# Patient Record
Sex: Male | Born: 1960 | Race: White | Hispanic: No | Marital: Single | Smoking: Never smoker
Health system: Southern US, Community
[De-identification: ages and names within clinical notes are randomized; demographics above are authoritative.]

---

## 2021-09-20 ENCOUNTER — Inpatient Hospital Stay (HOSPITAL_COMMUNITY): Payer: No Typology Code available for payment source

## 2021-09-20 ENCOUNTER — Inpatient Hospital Stay: Payer: Self-pay

## 2021-09-20 ENCOUNTER — Emergency Department (HOSPITAL_COMMUNITY): Payer: No Typology Code available for payment source

## 2021-09-20 ENCOUNTER — Inpatient Hospital Stay (HOSPITAL_COMMUNITY)
Admission: EM | Admit: 2021-09-20 | Discharge: 2021-10-16 | DRG: 004 | Disposition: A | Discharge status: Other Acute Inpatient Hospital | Payer: No Typology Code available for payment source | Attending: Internal Medicine | Admitting: Internal Medicine

## 2021-09-20 DIAGNOSIS — J9600 Acute respiratory failure, unspecified whether with hypoxia or hypercapnia: Secondary | ICD-10-CM

## 2021-09-20 DIAGNOSIS — T17908A Unspecified foreign body in respiratory tract, part unspecified causing other injury, initial encounter: Secondary | ICD-10-CM

## 2021-09-20 DIAGNOSIS — E870 Hyperosmolality and hypernatremia: Secondary | ICD-10-CM | POA: Diagnosis present

## 2021-09-20 DIAGNOSIS — I618 Other nontraumatic intracerebral hemorrhage: Principal | ICD-10-CM | POA: Diagnosis present

## 2021-09-20 DIAGNOSIS — E1165 Type 2 diabetes mellitus with hyperglycemia: Secondary | ICD-10-CM | POA: Diagnosis present

## 2021-09-20 DIAGNOSIS — G936 Cerebral edema: Secondary | ICD-10-CM | POA: Diagnosis present

## 2021-09-20 DIAGNOSIS — J690 Pneumonitis due to inhalation of food and vomit: Secondary | ICD-10-CM | POA: Diagnosis present

## 2021-09-20 DIAGNOSIS — E1169 Type 2 diabetes mellitus with other specified complication: Secondary | ICD-10-CM | POA: Diagnosis present

## 2021-09-20 DIAGNOSIS — R0989 Other specified symptoms and signs involving the circulatory and respiratory systems: Secondary | ICD-10-CM

## 2021-09-20 DIAGNOSIS — R1312 Dysphagia, oropharyngeal phase: Secondary | ICD-10-CM | POA: Diagnosis present

## 2021-09-20 DIAGNOSIS — Z91013 Allergy to seafood: Secondary | ICD-10-CM

## 2021-09-20 DIAGNOSIS — J398 Other specified diseases of upper respiratory tract: Secondary | ICD-10-CM | POA: Diagnosis present

## 2021-09-20 DIAGNOSIS — Z6835 Body mass index (BMI) 35.0-35.9, adult: Secondary | ICD-10-CM

## 2021-09-20 DIAGNOSIS — I6389 Other cerebral infarction: Secondary | ICD-10-CM | POA: Diagnosis not present

## 2021-09-20 DIAGNOSIS — I610 Nontraumatic intracerebral hemorrhage in hemisphere, subcortical: Secondary | ICD-10-CM | POA: Diagnosis not present

## 2021-09-20 DIAGNOSIS — J9811 Atelectasis: Secondary | ICD-10-CM | POA: Diagnosis present

## 2021-09-20 DIAGNOSIS — I619 Nontraumatic intracerebral hemorrhage, unspecified: Secondary | ICD-10-CM | POA: Diagnosis not present

## 2021-09-20 DIAGNOSIS — Z930 Tracheostomy status: Secondary | ICD-10-CM | POA: Diagnosis not present

## 2021-09-20 DIAGNOSIS — Z7901 Long term (current) use of anticoagulants: Secondary | ICD-10-CM

## 2021-09-20 DIAGNOSIS — R338 Other retention of urine: Secondary | ICD-10-CM

## 2021-09-20 DIAGNOSIS — Z91041 Radiographic dye allergy status: Secondary | ICD-10-CM

## 2021-09-20 DIAGNOSIS — H409 Unspecified glaucoma: Secondary | ICD-10-CM | POA: Diagnosis present

## 2021-09-20 DIAGNOSIS — R414 Neurologic neglect syndrome: Secondary | ICD-10-CM | POA: Diagnosis present

## 2021-09-20 DIAGNOSIS — K9420 Gastrostomy complication, unspecified: Secondary | ICD-10-CM | POA: Diagnosis not present

## 2021-09-20 DIAGNOSIS — Z20822 Contact with and (suspected) exposure to covid-19: Secondary | ICD-10-CM | POA: Diagnosis present

## 2021-09-20 DIAGNOSIS — T148XXA Other injury of unspecified body region, initial encounter: Secondary | ICD-10-CM

## 2021-09-20 DIAGNOSIS — L89156 Pressure-induced deep tissue damage of sacral region: Secondary | ICD-10-CM | POA: Diagnosis not present

## 2021-09-20 DIAGNOSIS — Z9911 Dependence on respirator [ventilator] status: Secondary | ICD-10-CM

## 2021-09-20 DIAGNOSIS — I161 Hypertensive emergency: Secondary | ICD-10-CM | POA: Diagnosis present

## 2021-09-20 DIAGNOSIS — D72829 Elevated white blood cell count, unspecified: Secondary | ICD-10-CM | POA: Diagnosis not present

## 2021-09-20 DIAGNOSIS — E669 Obesity, unspecified: Secondary | ICD-10-CM | POA: Diagnosis present

## 2021-09-20 DIAGNOSIS — R471 Dysarthria and anarthria: Secondary | ICD-10-CM | POA: Diagnosis present

## 2021-09-20 DIAGNOSIS — Z888 Allergy status to other drugs, medicaments and biological substances status: Secondary | ICD-10-CM

## 2021-09-20 DIAGNOSIS — I4821 Permanent atrial fibrillation: Secondary | ICD-10-CM | POA: Diagnosis present

## 2021-09-20 DIAGNOSIS — R159 Full incontinence of feces: Secondary | ICD-10-CM | POA: Diagnosis not present

## 2021-09-20 DIAGNOSIS — F05 Delirium due to known physiological condition: Secondary | ICD-10-CM | POA: Diagnosis not present

## 2021-09-20 DIAGNOSIS — G8194 Hemiplegia, unspecified affecting left nondominant side: Secondary | ICD-10-CM | POA: Diagnosis present

## 2021-09-20 DIAGNOSIS — Z978 Presence of other specified devices: Secondary | ICD-10-CM | POA: Diagnosis not present

## 2021-09-20 DIAGNOSIS — I63412 Cerebral infarction due to embolism of left middle cerebral artery: Secondary | ICD-10-CM | POA: Diagnosis not present

## 2021-09-20 DIAGNOSIS — E854 Organ-limited amyloidosis: Secondary | ICD-10-CM | POA: Diagnosis present

## 2021-09-20 DIAGNOSIS — I69191 Dysphagia following nontraumatic intracerebral hemorrhage: Secondary | ICD-10-CM

## 2021-09-20 DIAGNOSIS — Z931 Gastrostomy status: Secondary | ICD-10-CM | POA: Diagnosis not present

## 2021-09-20 DIAGNOSIS — D649 Anemia, unspecified: Secondary | ICD-10-CM | POA: Diagnosis not present

## 2021-09-20 DIAGNOSIS — I152 Hypertension secondary to endocrine disorders: Secondary | ICD-10-CM | POA: Diagnosis present

## 2021-09-20 DIAGNOSIS — G935 Compression of brain: Secondary | ICD-10-CM | POA: Diagnosis present

## 2021-09-20 DIAGNOSIS — J9602 Acute respiratory failure with hypercapnia: Secondary | ICD-10-CM | POA: Diagnosis not present

## 2021-09-20 DIAGNOSIS — N179 Acute kidney failure, unspecified: Secondary | ICD-10-CM | POA: Diagnosis present

## 2021-09-20 DIAGNOSIS — R509 Fever, unspecified: Secondary | ICD-10-CM | POA: Diagnosis not present

## 2021-09-20 DIAGNOSIS — J9601 Acute respiratory failure with hypoxia: Secondary | ICD-10-CM | POA: Diagnosis present

## 2021-09-20 DIAGNOSIS — R61 Generalized hyperhidrosis: Secondary | ICD-10-CM | POA: Diagnosis not present

## 2021-09-20 DIAGNOSIS — I639 Cerebral infarction, unspecified: Secondary | ICD-10-CM

## 2021-09-20 DIAGNOSIS — N390 Urinary tract infection, site not specified: Secondary | ICD-10-CM | POA: Diagnosis not present

## 2021-09-20 DIAGNOSIS — R451 Restlessness and agitation: Secondary | ICD-10-CM | POA: Diagnosis not present

## 2021-09-20 DIAGNOSIS — I480 Paroxysmal atrial fibrillation: Secondary | ICD-10-CM | POA: Diagnosis not present

## 2021-09-20 DIAGNOSIS — J9691 Respiratory failure, unspecified with hypoxia: Secondary | ICD-10-CM | POA: Diagnosis present

## 2021-09-20 DIAGNOSIS — G9349 Other encephalopathy: Secondary | ICD-10-CM | POA: Diagnosis present

## 2021-09-20 DIAGNOSIS — F1090 Alcohol use, unspecified, uncomplicated: Secondary | ICD-10-CM | POA: Diagnosis present

## 2021-09-20 DIAGNOSIS — Z794 Long term (current) use of insulin: Secondary | ICD-10-CM | POA: Diagnosis not present

## 2021-09-20 DIAGNOSIS — T45515A Adverse effect of anticoagulants, initial encounter: Secondary | ICD-10-CM | POA: Diagnosis present

## 2021-09-20 DIAGNOSIS — Z7984 Long term (current) use of oral hypoglycemic drugs: Secondary | ICD-10-CM

## 2021-09-20 DIAGNOSIS — R5381 Other malaise: Secondary | ICD-10-CM | POA: Diagnosis not present

## 2021-09-20 DIAGNOSIS — Z781 Physical restraint status: Secondary | ICD-10-CM

## 2021-09-20 DIAGNOSIS — G4733 Obstructive sleep apnea (adult) (pediatric): Secondary | ICD-10-CM | POA: Diagnosis present

## 2021-09-20 DIAGNOSIS — F419 Anxiety disorder, unspecified: Secondary | ICD-10-CM | POA: Diagnosis not present

## 2021-09-20 DIAGNOSIS — R29810 Facial weakness: Secondary | ICD-10-CM | POA: Diagnosis present

## 2021-09-20 DIAGNOSIS — I10 Essential (primary) hypertension: Secondary | ICD-10-CM | POA: Diagnosis not present

## 2021-09-20 DIAGNOSIS — Z833 Family history of diabetes mellitus: Secondary | ICD-10-CM

## 2021-09-20 DIAGNOSIS — E1159 Type 2 diabetes mellitus with other circulatory complications: Secondary | ICD-10-CM

## 2021-09-20 DIAGNOSIS — Z823 Family history of stroke: Secondary | ICD-10-CM

## 2021-09-20 DIAGNOSIS — Z79899 Other long term (current) drug therapy: Secondary | ICD-10-CM

## 2021-09-20 DIAGNOSIS — I615 Nontraumatic intracerebral hemorrhage, intraventricular: Secondary | ICD-10-CM | POA: Diagnosis present

## 2021-09-20 DIAGNOSIS — R339 Retention of urine, unspecified: Secondary | ICD-10-CM | POA: Diagnosis not present

## 2021-09-20 DIAGNOSIS — Z4659 Encounter for fitting and adjustment of other gastrointestinal appliance and device: Secondary | ICD-10-CM

## 2021-09-20 DIAGNOSIS — E1100 Type 2 diabetes mellitus with hyperosmolarity without nonketotic hyperglycemic-hyperosmolar coma (NKHHC): Secondary | ICD-10-CM | POA: Diagnosis not present

## 2021-09-20 DIAGNOSIS — F17210 Nicotine dependence, cigarettes, uncomplicated: Secondary | ICD-10-CM | POA: Diagnosis present

## 2021-09-20 DIAGNOSIS — E119 Type 2 diabetes mellitus without complications: Secondary | ICD-10-CM | POA: Diagnosis not present

## 2021-09-20 DIAGNOSIS — E7800 Pure hypercholesterolemia, unspecified: Secondary | ICD-10-CM | POA: Diagnosis present

## 2021-09-20 DIAGNOSIS — Z800 Family history of malignant neoplasm of digestive organs: Secondary | ICD-10-CM

## 2021-09-20 DIAGNOSIS — R29714 NIHSS score 14: Secondary | ICD-10-CM | POA: Diagnosis present

## 2021-09-20 DIAGNOSIS — R197 Diarrhea, unspecified: Secondary | ICD-10-CM | POA: Diagnosis not present

## 2021-09-20 DIAGNOSIS — D6832 Hemorrhagic disorder due to extrinsic circulating anticoagulants: Secondary | ICD-10-CM | POA: Diagnosis present

## 2021-09-20 DIAGNOSIS — Z8673 Personal history of transient ischemic attack (TIA), and cerebral infarction without residual deficits: Secondary | ICD-10-CM

## 2021-09-20 DIAGNOSIS — E1149 Type 2 diabetes mellitus with other diabetic neurological complication: Secondary | ICD-10-CM | POA: Diagnosis not present

## 2021-09-20 DIAGNOSIS — H518 Other specified disorders of binocular movement: Secondary | ICD-10-CM | POA: Diagnosis present

## 2021-09-20 LAB — I-STAT CHEM 8, ED
BUN: 20 mg/dL (ref 6–20)
Calcium, Ion: 1.17 mmol/L (ref 1.15–1.40)
Chloride: 102 mmol/L (ref 98–111)
Creatinine, Ser: 0.8 mg/dL (ref 0.61–1.24)
Glucose, Bld: 373 mg/dL — ABNORMAL HIGH (ref 70–99)
HCT: 45 % (ref 39.0–52.0)
Hemoglobin: 15.3 g/dL (ref 13.0–17.0)
Potassium: 3.6 mmol/L (ref 3.5–5.1)
Sodium: 139 mmol/L (ref 135–145)
TCO2: 27 mmol/L (ref 22–32)

## 2021-09-20 LAB — MRSA NEXT GEN BY PCR, NASAL: MRSA by PCR Next Gen: NOT DETECTED

## 2021-09-20 LAB — PROTIME-INR
INR: 2.1 — ABNORMAL HIGH (ref 0.8–1.2)
INR: 1.2 (ref 0.8–1.2)
Prothrombin Time: 23.7 seconds — ABNORMAL HIGH (ref 11.4–15.2)
Prothrombin Time: 14.8 seconds (ref 11.4–15.2)

## 2021-09-20 LAB — DIFFERENTIAL
Abs Immature Granulocytes: 0.03 10*3/uL (ref 0.00–0.07)
Basophils Absolute: 0 10*3/uL (ref 0.0–0.1)
Basophils Relative: 0 %
Eosinophils Absolute: 0.2 10*3/uL (ref 0.0–0.5)
Eosinophils Relative: 3 %
Immature Granulocytes: 0 %
Lymphocytes Relative: 16 %
Lymphs Abs: 1.4 10*3/uL (ref 0.7–4.0)
Monocytes Absolute: 0.8 10*3/uL (ref 0.1–1.0)
Monocytes Relative: 9 %
Neutro Abs: 6.6 10*3/uL (ref 1.7–7.7)
Neutrophils Relative %: 72 %

## 2021-09-20 LAB — POCT I-STAT 7, (LYTES, BLD GAS, ICA,H+H)
Acid-Base Excess: 3 mmol/L — ABNORMAL HIGH (ref 0.0–2.0)
Bicarbonate: 29.3 mmol/L — ABNORMAL HIGH (ref 20.0–28.0)
Calcium, Ion: 1.26 mmol/L (ref 1.15–1.40)
HCT: 43 % (ref 39.0–52.0)
Hemoglobin: 14.6 g/dL (ref 13.0–17.0)
O2 Saturation: 100 %
Patient temperature: 36.9
Potassium: 3.5 mmol/L (ref 3.5–5.1)
Sodium: 137 mmol/L (ref 135–145)
TCO2: 31 mmol/L (ref 22–32)
pCO2 arterial: 50.5 mmHg — ABNORMAL HIGH (ref 32.0–48.0)
pH, Arterial: 7.371 (ref 7.350–7.450)
pO2, Arterial: 417 mmHg — ABNORMAL HIGH (ref 83.0–108.0)

## 2021-09-20 LAB — COMPREHENSIVE METABOLIC PANEL
ALT: 27 U/L (ref 0–44)
AST: 24 U/L (ref 15–41)
Albumin: 4.1 g/dL (ref 3.5–5.0)
Alkaline Phosphatase: 94 U/L (ref 38–126)
Anion gap: 14 (ref 5–15)
BUN: 18 mg/dL (ref 6–20)
CO2: 23 mmol/L (ref 22–32)
Calcium: 9.9 mg/dL (ref 8.9–10.3)
Chloride: 101 mmol/L (ref 98–111)
Creatinine, Ser: 1.06 mg/dL (ref 0.61–1.24)
GFR, Estimated: >60 mL/min (ref >60)
Glucose, Bld: 371 mg/dL — ABNORMAL HIGH (ref 70–99)
Potassium: 3.7 mmol/L (ref 3.5–5.1)
Sodium: 138 mmol/L (ref 135–145)
Total Bilirubin: 0.8 mg/dL (ref 0.3–1.2)
Total Protein: 7 g/dL (ref 6.5–8.1)

## 2021-09-20 LAB — CBC
HCT: 46.1 % (ref 39.0–52.0)
Hemoglobin: 16.2 g/dL (ref 13.0–17.0)
MCH: 30.5 pg (ref 26.0–34.0)
MCHC: 35.1 g/dL (ref 30.0–36.0)
MCV: 86.7 fL (ref 80.0–100.0)
Platelets: 248 10*3/uL (ref 150–400)
RBC: 5.32 MIL/uL (ref 4.22–5.81)
RDW: 12.7 % (ref 11.5–15.5)
WBC: 9.1 10*3/uL (ref 4.0–10.5)
nRBC: 0 % (ref 0.0–0.2)

## 2021-09-20 LAB — GLUCOSE, CAPILLARY
Glucose-Capillary: 339 mg/dL — ABNORMAL HIGH (ref 70–99)
Glucose-Capillary: 194 mg/dL — ABNORMAL HIGH (ref 70–99)
Glucose-Capillary: 160 mg/dL — ABNORMAL HIGH (ref 70–99)

## 2021-09-20 LAB — HIV ANTIBODY (ROUTINE TESTING W REFLEX): HIV Screen 4th Generation wRfx: NONREACTIVE

## 2021-09-20 LAB — RESP PANEL BY RT-PCR (FLU A&B, COVID) ARPGX2
Influenza A by PCR: NEGATIVE
Influenza B by PCR: NEGATIVE
SARS Coronavirus 2 by RT PCR: NEGATIVE

## 2021-09-20 LAB — SODIUM
Sodium: 138 mmol/L (ref 135–145)
Sodium: 145 mmol/L (ref 135–145)

## 2021-09-20 LAB — APTT: aPTT: 31 seconds (ref 24–36)

## 2021-09-20 MED ORDER — LABETALOL HCL 5 MG/ML IV SOLN
INTRAVENOUS | Status: AC
  Administered 2021-09-20: 10 mg via INTRAVENOUS
  Filled 2021-09-20: qty 4

## 2021-09-20 MED ORDER — CLEVIDIPINE BUTYRATE 0.5 MG/ML IV EMUL
0.0000 mg/h | INTRAVENOUS | Status: DC
  Administered 2021-09-20: 1 mg/h via INTRAVENOUS
  Administered 2021-09-20: 2 mg/h via INTRAVENOUS
  Administered 2021-09-20: 4 mg/h via INTRAVENOUS
  Filled 2021-09-20 (×3): qty 50

## 2021-09-20 MED ORDER — ORAL CARE MOUTH RINSE
15.0000 mL | OROMUCOSAL | Status: DC
  Administered 2021-09-20 – 2021-10-16 (×251): 15 mL via OROMUCOSAL

## 2021-09-20 MED ORDER — INSULIN ASPART 100 UNIT/ML IJ SOLN: 0.0000 [IU] | Freq: Three times a day (TID) | INTRAMUSCULAR | Status: DC

## 2021-09-20 MED ORDER — FENTANYL BOLUS VIA INFUSION
50.0000 ug | INTRAVENOUS | Status: DC | PRN
  Administered 2021-09-20: 50 ug via INTRAVENOUS
  Administered 2021-09-21 (×2): 75 ug via INTRAVENOUS
  Administered 2021-09-22 – 2021-09-25 (×12): 100 ug via INTRAVENOUS
  Filled 2021-09-20: qty 100

## 2021-09-20 MED ORDER — FENTANYL 2500MCG IN NS 250ML (10MCG/ML) PREMIX INFUSION
0.0000 ug/h | INTRAVENOUS | Status: DC
  Administered 2021-09-20: 100 ug/h via INTRAVENOUS
  Administered 2021-09-21: 125 ug/h via INTRAVENOUS
  Administered 2021-09-21: 100 ug/h via INTRAVENOUS
  Administered 2021-09-22: 12:00:00 300 ug/h via INTRAVENOUS
  Administered 2021-09-22: 03:00:00 100 ug/h via INTRAVENOUS
  Administered 2021-09-22: 23:00:00 200 ug/h via INTRAVENOUS
  Administered 2021-09-23: 125 ug/h via INTRAVENOUS
  Administered 2021-09-24: 50 ug/h via INTRAVENOUS
  Filled 2021-09-20 (×6): qty 250

## 2021-09-20 MED ORDER — DOCUSATE SODIUM 100 MG PO CAPS: 100.0000 mg | ORAL_CAPSULE | Freq: Two times a day (BID) | ORAL | Status: DC | PRN

## 2021-09-20 MED ORDER — ACETAMINOPHEN 325 MG PO TABS: 650.0000 mg | ORAL_TABLET | ORAL | Status: DC | PRN

## 2021-09-20 MED ORDER — SODIUM CHLORIDE 0.9 % IV SOLN
2.0000 g | INTRAVENOUS | Status: DC
  Administered 2021-09-20: 2 g via INTRAVENOUS
  Filled 2021-09-20 (×2): qty 20

## 2021-09-20 MED ORDER — SODIUM CHLORIDE 0.9 % IV SOLN
500.0000 mg | Freq: Once | INTRAVENOUS | Status: AC
  Administered 2021-09-20: 500 mg via INTRAVENOUS
  Filled 2021-09-20: qty 5

## 2021-09-20 MED ORDER — IPRATROPIUM-ALBUTEROL 0.5-2.5 (3) MG/3ML IN SOLN
3.0000 mL | Freq: Four times a day (QID) | RESPIRATORY_TRACT | Status: DC
  Administered 2021-09-20 – 2021-09-21 (×3): 3 mL via RESPIRATORY_TRACT
  Filled 2021-09-20 (×3): qty 3

## 2021-09-20 MED ORDER — SODIUM CHLORIDE 3 % IV SOLN
INTRAVENOUS | Status: DC
  Filled 2021-09-20 (×3): qty 500

## 2021-09-20 MED ORDER — LABETALOL HCL 5 MG/ML IV SOLN
20.0000 mg | Freq: Once | INTRAVENOUS | Status: AC
  Administered 2021-09-20: 20 mg via INTRAVENOUS

## 2021-09-20 MED ORDER — NOREPINEPHRINE 4 MG/250ML-% IV SOLN
0.0000 ug/min | INTRAVENOUS | Status: DC
  Administered 2021-09-20: 1 ug/min via INTRAVENOUS
  Filled 2021-09-20: qty 250

## 2021-09-20 MED ORDER — LABETALOL HCL 5 MG/ML IV SOLN: 10.0000 mg | Freq: Once | INTRAVENOUS | Status: AC

## 2021-09-20 MED ORDER — ACETAMINOPHEN 160 MG/5ML PO SOLN
650.0000 mg | ORAL | Status: DC | PRN
  Administered 2021-09-20 – 2021-09-22 (×6): 650 mg
  Filled 2021-09-20 (×6): qty 20.3

## 2021-09-20 MED ORDER — FENTANYL CITRATE PF 50 MCG/ML IJ SOSY
50.0000 ug | PREFILLED_SYRINGE | Freq: Once | INTRAMUSCULAR | Status: AC
  Administered 2021-09-20: 50 ug via INTRAVENOUS

## 2021-09-20 MED ORDER — SODIUM CHLORIDE 0.9% FLUSH: 10.0000 mL | INTRAVENOUS | Status: DC | PRN

## 2021-09-20 MED ORDER — SODIUM CHLORIDE 0.9% FLUSH
10.0000 mL | Freq: Two times a day (BID) | INTRAVENOUS | Status: DC
  Administered 2021-09-20: 10 mL
  Administered 2021-09-21: 20 mL
  Administered 2021-09-21: 10 mL
  Administered 2021-09-22: 22:00:00 20 mL
  Administered 2021-09-22: 09:00:00 10 mL
  Administered 2021-09-23: 20 mL
  Administered 2021-09-23 – 2021-09-24 (×2): 10 mL
  Administered 2021-09-24: 20 mL
  Administered 2021-09-25: 30 mL
  Administered 2021-09-25: 10 mL
  Administered 2021-09-26: 30 mL
  Administered 2021-09-26: 10 mL
  Administered 2021-09-27: 20 mL
  Administered 2021-09-27 – 2021-10-03 (×12): 10 mL

## 2021-09-20 MED ORDER — DEXTROSE 5 % IV SOLN
250.0000 mg | INTRAVENOUS | Status: DC
  Filled 2021-09-20: qty 2.5

## 2021-09-20 MED ORDER — CHLORHEXIDINE GLUCONATE CLOTH 2 % EX PADS
6.0000 | MEDICATED_PAD | Freq: Every day | CUTANEOUS | Status: DC
  Administered 2021-09-21 – 2021-10-16 (×27): 6 via TOPICAL

## 2021-09-20 MED ORDER — DOCUSATE SODIUM 50 MG/5ML PO LIQD
100.0000 mg | Freq: Two times a day (BID) | ORAL | Status: DC | PRN
  Administered 2021-09-25: 100 mg
  Filled 2021-09-20: qty 10

## 2021-09-20 MED ORDER — VITAMIN K1 10 MG/ML IJ SOLN
10.0000 mg | INTRAVENOUS | Status: AC
  Administered 2021-09-20: 10 mg via INTRAVENOUS
  Filled 2021-09-20: qty 1

## 2021-09-20 MED ORDER — PROTHROMBIN COMPLEX CONC HUMAN 500 UNITS IV KIT
2186.0000 [IU] | PACK | Status: AC
  Administered 2021-09-20: 2186 [IU] via INTRAVENOUS
  Filled 2021-09-20: qty 2186

## 2021-09-20 MED ORDER — ACETAMINOPHEN 650 MG RE SUPP: 650.0000 mg | RECTAL | Status: DC | PRN

## 2021-09-20 MED ORDER — SODIUM CHLORIDE 0.9% FLUSH
3.0000 mL | Freq: Once | INTRAVENOUS | Status: AC
  Administered 2021-09-20: 3 mL via INTRAVENOUS

## 2021-09-20 MED ORDER — SENNOSIDES-DOCUSATE SODIUM 8.6-50 MG PO TABS
1.0000 | ORAL_TABLET | Freq: Two times a day (BID) | ORAL | Status: DC
  Administered 2021-09-20 – 2021-09-24 (×9): 1
  Filled 2021-09-20 (×9): qty 1

## 2021-09-20 MED ORDER — INSULIN ASPART 100 UNIT/ML IJ SOLN
0.0000 [IU] | INTRAMUSCULAR | Status: DC
  Administered 2021-09-20 (×2): 3 [IU] via SUBCUTANEOUS
  Administered 2021-09-20: 11 [IU] via SUBCUTANEOUS
  Administered 2021-09-21: 5 [IU] via SUBCUTANEOUS
  Administered 2021-09-21: 2 [IU] via SUBCUTANEOUS
  Administered 2021-09-21 (×3): 3 [IU] via SUBCUTANEOUS
  Administered 2021-09-22: 08:00:00 5 [IU] via SUBCUTANEOUS
  Administered 2021-09-22: 12:00:00 11 [IU] via SUBCUTANEOUS
  Administered 2021-09-22 (×2): 3 [IU] via SUBCUTANEOUS
  Administered 2021-09-22: 04:00:00 2 [IU] via SUBCUTANEOUS
  Administered 2021-09-23 (×2): 3 [IU] via SUBCUTANEOUS
  Administered 2021-09-23: 2 [IU] via SUBCUTANEOUS
  Administered 2021-09-23: 20:00:00 5 [IU] via SUBCUTANEOUS
  Administered 2021-09-23: 04:00:00 3 [IU] via SUBCUTANEOUS
  Administered 2021-09-23: 8 [IU] via SUBCUTANEOUS
  Administered 2021-09-23 – 2021-09-24 (×2): 5 [IU] via SUBCUTANEOUS
  Administered 2021-09-24 (×3): 3 [IU] via SUBCUTANEOUS
  Administered 2021-09-24: 5 [IU] via SUBCUTANEOUS
  Administered 2021-09-24: 3 [IU] via SUBCUTANEOUS
  Administered 2021-09-25: 5 [IU] via SUBCUTANEOUS
  Administered 2021-09-25: 3 [IU] via SUBCUTANEOUS

## 2021-09-20 MED ORDER — STROKE: EARLY STAGES OF RECOVERY BOOK
Freq: Once | Status: AC
  Filled 2021-09-20: qty 1

## 2021-09-20 MED ORDER — PROPOFOL 1000 MG/100ML IV EMUL
0.0000 ug/kg/min | INTRAVENOUS | Status: DC
  Administered 2021-09-20 – 2021-09-21 (×3): 20 ug/kg/min via INTRAVENOUS
  Administered 2021-09-21: 30 ug/kg/min via INTRAVENOUS
  Administered 2021-09-22: 05:00:00 50 ug/kg/min via INTRAVENOUS
  Administered 2021-09-22: 17:00:00 35 ug/kg/min via INTRAVENOUS
  Administered 2021-09-22: 09:00:00 50 ug/kg/min via INTRAVENOUS
  Administered 2021-09-22: 01:00:00 30 ug/kg/min via INTRAVENOUS
  Administered 2021-09-22: 13:00:00 50 ug/kg/min via INTRAVENOUS
  Administered 2021-09-22: 30 ug/kg/min via INTRAVENOUS
  Administered 2021-09-23: 04:00:00 40 ug/kg/min via INTRAVENOUS
  Administered 2021-09-23: 10:00:00 20 ug/kg/min via INTRAVENOUS
  Filled 2021-09-20 (×14): qty 100

## 2021-09-20 MED ORDER — POLYETHYLENE GLYCOL 3350 17 G PO PACK: 17.0000 g | PACK | Freq: Every day | ORAL | Status: DC | PRN

## 2021-09-20 MED ORDER — CHLORHEXIDINE GLUCONATE 0.12% ORAL RINSE (MEDLINE KIT)
15.0000 mL | Freq: Two times a day (BID) | OROMUCOSAL | Status: DC
  Administered 2021-09-20 – 2021-10-16 (×52): 15 mL via OROMUCOSAL

## 2021-09-20 MED ORDER — PROPOFOL 1000 MG/100ML IV EMUL
INTRAVENOUS | Status: AC
  Administered 2021-09-20: 20 ug/kg/min via INTRAVENOUS
  Filled 2021-09-20: qty 100

## 2021-09-20 MED ORDER — PANTOPRAZOLE SODIUM 40 MG IV SOLR
40.0000 mg | Freq: Every day | INTRAVENOUS | Status: DC
  Administered 2021-09-20: 40 mg via INTRAVENOUS
  Filled 2021-09-20: qty 40

## 2021-09-20 NOTE — TOC Progression Note (Signed)
Transition of Care [facility]) - Progression Note    Patient Details  Name: Azreal Stthomas MRN: [id number] Date of Birth: [birth year]  Transition of Care [facility]) CM/SW Contact  Leone Haven, RN Phone Number: 09/20/2021, 1:21 PM  Clinical Narrative:     Transition of Care [facility]) Screening Note   Patient Details  Name: Lauri Till Date of Birth: [birth year]   Transition of Care [facility]) CM/SW Contact:    Leone Haven, RN Phone Number: 09/20/2021, 1:21 PM    Transition of Care Department [facility]) has reviewed patient and no TOC needs have been identified at this time. We will continue to monitor patient advancement through interdisciplinary progression rounds. If new patient transition needs arise, please place a TOC consult.          Expected Discharge Plan and Services                                                 Social Determinants of Health (SDOH) Interventions    Readmission Risk Interventions No flowsheet data found.

## 2021-09-20 NOTE — Progress Notes (Signed)
Pt has only put out 1mL of urine so far this shift, E-link aware.

## 2021-09-20 NOTE — H&P (Addendum)
Neurology Admission H&P    Chief Complaint: Code Stroke.   HPI: [name] 01/23/1961 with a PMHx of AFib on Coumadin s/p recent conversion, HTN, HLD, remote tobacco abuse,  Cataract OU, glaucoma, obesity, CVA, DM II, and OSA who presents to [facility] ED via EMS as a code stroke for left sided weakness, dysarthria, and right fixed gaze. NP called son who was with the patient. Son said he saw patient normal in middle of night but at 0700 hours, patient was sitting in a chair and leaning to the left. Son called 911.   Per son, patient is independent with MRS of 0. He is on Coumadin for Afib, but son doesn't know the dose. 5mg  is on the Rx bottle and patient said he took 3 pills yesterday. Per son, he had a cardioversion a few weeks ago. He goes to [place] for care.   After brief exam at ED bridge, patient taken emergently to CT suite.  CTH  showed ICH. No other imaging done. Labetalol was given in CT scanner for BP outside goal of 120-140. Patient began to have some gurgling respirations and respirations were suppressed. Upon arrival back to ED room, he was given another dose of Labetalol and Cleviprex was hung with titrations to goal. He was given reversal for Warfarin and intubated by Dr. Gilford Raid in ED. PCCM was called and informed of intubation and patient's diagnosis and they will follow for ventilation.   After chart merged, NP was able to locate [facility] records. Patient was in INR clinic 09/08/21. INR was 3. His Warfarin dose was 5mg , ii po qd except iii po on Thursdays and Saturdays. Apparently, patient has had a stroke at some point, but can not find any records about this.   Neurology responded to code stroke and will admit for ICH.   LKW: 0200 on 09/20/21. TNK: no, ICH.   IR:no, ICH.  MRS: 0 ICH score: 2 NIHSS components Score: Comment  1a Level of Conscious 0[]  1[x]  2[]  3[]      1b LOC Questions 0[]  1[x]  2[]       1c LOC Commands 0[x]  1[]  2[]       2 Best Gaze 0[]  1[]  2[x]       3  Visual 0[]  1[]  2[x]  3[]      4 Facial Palsy 0[]  1[x]  2[]  3[]      5a Motor Arm - left 0[]  1[]  2[]  3[x]  4[]  UN[]    5b Motor Arm - Right 0[x]  1[]  2[]  3[]  4[]  UN[]    6a Motor Leg - Left 0[]  1[]  2[]  3[x]  4[]  UN[]    6b Motor Leg - Right 0[x]  1[]  2[]  3[]  4[]  UN[]    7 Limb Ataxia 0[]  1[x]  2[]  3[]  UN[]     8 Sensory 0[]  1[]  2[x]  UN[]      9 Best Language 0[x]  1[]  2[]  3[]      10 Dysarthria 0[]  1[]  2[x]  UN[]      11 Extinct. and Inattention 0[]  1[x]  2[]       TOTAL: 100      PMHX: Remote stroke, remote tobacco abuse, Afib on Warfarin, HTN, HLD, OSA, DM II, glaucoma, cataract OU.   FMHx: DM II, colon cancer, DM II, glaudoma, HTN, stroke.  Social History: Quit smoking 20 years ago. Rare beer. No illicit drugs.   Allergies: Apixiban, Iodine (rash, hives), shellfish.  Medications taken from last [place] visit:   amLODIPine (NORVASC) 10 MG tablet Take 1 tablet (10 mg total) by mouth daily. 90 tablet 3   atorvastatin (LIPITOR) 80 MG tablet  TAKE 1 TABLET BY MOUTH EVERY DAY 90 tablet 3   carvediloL (COREG) 25 MG tablet TAKE 1 TABLET (25 MG TOTAL) BY MOUTH 2 TIMES DAILY WITH MEALS. 180 tablet 3   empagliflozin (JARDIANCE) 25 mg Tab tablet Take 1 tablet (25 mg total) by mouth daily. 30 tablet 11   flecainide (TAMBOCOR) 150 MG tablet TAKE 1 TABLET BY MOUTH TWICE A DAY 180 tablet 3   insulin detemir (LEVEMIR FLEXTOUCH U-100 INSULN) 100 unit/mL (3 mL) injection Inject 35 units qAM and 25 units qPM. MDD: 100 units/day 30 mL 5   losartan-hydrochlorothiazide (HYZAAR) 100-25 mg per tablet TAKE 1 TABLET BY MOUTH EVERY DAY 90 tablet 2   metFORMIN (GLUCOPHAGE) 1000 MG tablet Take 1 tablet (1,000 mg total) by mouth 2 times daily with meals. 180 tablet 3   warfarin (COUMADIN) 5 MG tablet *ANTICOAGULANT* 2 TABLETS DAILY EXCEPT 3 TABLETS EVERY Thursday AND SATURDAYS OR AS DIRECTED FROM COUMADIN CLINIC 200 tablet 0   ACCU-CHEK GUIDE Strp CHECK BG 2 TIMES/DAY. DX E11.65 200 strip 1   amoxicillin (AMOXIL) 500 MG  capsule Take 4 capsules 1 hour prior to dental appointment 4 capsule 0   lancets (ONETOUCH DELICA LANCETS) 33 gauge Misc 1 Stick by [employer]Non-Drug; Combo Route) route 2 times daily. 60 each 3   pen needle, diabetic (BD ULTRA-FINE SHORT PEN NEEDLE) 31 gauge x 5/16" Ndle Use as needed for 2 injections daily 200 each 3   tadalafiL (CIALIS) 20 mg tablet Take one tablet 3 hrs before activity. Effective for 3 days. 30 tablet 5    ROS: Unable to attain due to emergent nature of event and/or mental status.   Physical Examination: General: Appears toxic. Awake.   Psych: Affect appropriate. Calm. Cooperative.  HEENT-  Normocephalic, AT. No lymphadenopathy. No thyromegaly. No stiffness of neck.  Cardiovascular - RRR. No LE edema.  Lungs -gurgling at times, short periods of apnea  Abdomen - soft, NT. Extremities - warm, well perfused. Skin: WDI.  NIHSS:  1a Level of Consciousness: 1 1b LOC Questions: 2 1c LOC Commands: 0 2 Best Gaze: 2 3 Visual: 0 4 Facial Palsy: 1 5a Motor Arm - Left: 4 5b Motor Arm - Right: 0 6a Motor Leg - Left: 4 6b Motor Leg - Right: 0 7 Limb Ataxia: 0 8 Sensory: 0 9 Best Language: 0 10 Dysarthria: 2 11 Extinct and Inattention: 0 TOTAL: 14  Neuro exam: Difficult to get much exam because of emergent nature of event and intubation.   Mental Status: Awake. Is able to follow simple commands. Unable to answer orientation questions.  Speech/Language: speech is soft and difficult to understand.  + dysarthria.  + neglect left. Right gaze preference.  Cranial Nerves:  PERRL. Unable to test visual fields. EOMI not normal with noted left gaze. Left facial droop.   Motor:  Unable to move left side.  Tone is flaccid on left side.   Gait- deferred.  Labs pertinent to this encounter: INR  2.1  down to 1.2    aPTT  31   creat  0.8       glucose  373  Imaging: Independently reviewed by MD.   [facility] ICH with extension into ventricle on Right.   MRI brain Ordered.    Assessment:  61 yo male with stroke risk factors of Afib, HTN, HLD, DM, OSA. His INR was last checked at [facility] 09/08/21 but there is no note as to result, but the dose was listed as above. INR 3.1  down to 1.2 here. His Warfarin was reversed x 1. CTH with ICH extension into ventricles on Right. His BP was decreased to goal with Labetalol x 2 and Cleviprex drip. Intubated in ED. Later, taken to 4N ICU for admission.   Impression: -ICH  Plan:   CNS: -admit by neurology.  -NIHSS and neurology checks per protocol.  -CTH at 1500 hours.  -CTH stat for any change in neurological status.  -MRI brain without when able.   CV: -telemetry monitoring for arrhythmia.  -Echocardiogram.  -BP goal 120-140. -Cleviprex prn for BP goals.  -lipid profile.  -Goal LDL < 70.  -Start high intensity statin or increase the dose if LDL > 70.   Respiratory:  -PCCM consult to manage ETT and ventilator.   Prophylaxis: -SCDs only, no chemical VTE.  -Protonix 40mg  IV q 24 hours.  -Senna S i po bid. hold for loose stools.   Diet:  -NPO while intubated, then SLP evaluation.    Activity: -Bedrest.   ++Note, INR after reversal of Warfarin is 1.2, so no further reversal is needed.   Patient seen by Clance Boll, NP/Neurology and MD. Note and plan to be edited by MD as necessary.    NEUROHOSPITALIST ADDENDUM Performed a face to face diagnostic evaluation.   I have reviewed the contents of history and physical exam as documented by PA/ARNP/Resident and agree with above documentation.  I have discussed and formulated the above plan as documented. Edits to the note have been made as needed.  Impression/Key exam findings/Plan: 66M with hx of afibb on warfarin, hx of HTN who presents with Left sided weakness, R gaze deviation, neglect and dysarthria with an ICH score of 2. 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 Took 15mg  of warfarin on 09/19/21 in PM and INR was 2.1. He was also in hypertensive  emergency on presentation. Warfarin was reversed with Kcentra, with repeat INR of 1.2. Was also given 10 of VitK. Was given labetalol and started on Cleviprex. He became more somnolent in the ED and was unable to cough with gurgling respirations. He was intubated and admitted to ICU.  Plan: ICH - Reversed with Kcentra and Vit K with repeat INR of 1.2. - repeat imaging in 6 hours - repeat CTH overnight to monitor for developing hydrocephalus. - 3% Hypertonic saline - the location of ICH is close to uncus and mesial temporal lobe, will start HTS for Cerebral edema and prevent herniation.  Hypertensive Emergency: - cleviprex - Goal SBP < 140  Hypoxemic respiratory failure - On vent, management per PCCM team.  DM2: - sliding scale insulin  Impression: - R thalamic Intracerebral Hemorrhage with Intraventricular Hemorrhage with an ICH score of 2. - Cerebral compression/cerebral edema. - Hypertensive emergency. - Hypoxemic respiratory failure - Diabetes on sliding scale insulin - Atrial fibrillation - Hyperlipidemia - Obstructive sleep apnea.  This patient is critically ill and at significant risk of neurological worsening, death and care requires constant monitoring of vital signs, hemodynamics,respiratory and cardiac monitoring, neurological assessment, discussion with family, other specialists and medical decision making of high complexity. I spent 100 minutes of neurocritical care time  in the care of  this patient. This was time spent independent of any time provided by nurse practitioner or PA.  Donnetta Simpers [facility] Pager Number [phone] 09/20/2021  3:39 PM    Donnetta Simpers, MD [facility] [phone]   If 7pm to 7am, please call on call as listed on AMION.

## 2021-09-20 NOTE — Consult Note (Signed)
NAME:  Caleb Vaughan, MRN:  [id number], DOB:  [birth year], LOS: 0 ADMISSION DATE:  09/20/2021, CONSULTATION DATE:  09/20/2021 REFERRING MD:  Dr. Carren Rang, CHIEF COMPLAINT:  Hemorrhagic stroke    History of Present Illness:  Caleb Vaughan is a 61 y.o. male with PMH listed below  who presented to the emergency department 1/22 as a code stroke, last known normal 2 AM.   Stroke CT on admit revealed, positive for acute right thalamic hemorrhage with intraventricular extension with estimated intra-axial blood volume 25 mL. Mild Regional brain edema. Trace leftward midline shift. Patient was intubated in ED and PCCM was consulted for further assessment   Pertinent  Medical History  A-fin on coumadin  HTN HLD Previous smoker  Prior CVA  Diabetes  OSA   Significant Hospital Events: Including procedures, antibiotic start and stop dates in addition to other pertinent events   1/22 Admitted as code stroke found to have right thalamic bleed with trace leftward shift. Intubated in ED  Interim History / Subjective:  As above   Objective   Weight 93 kg.       No intake or output data in the 24 hours ending 09/20/21 1010 Filed Weights   09/20/21 0900  Weight: 93 kg    Examination: General: Acute on chronically ill appearing middle aged male on mechanical ventilation, in NAD HEENT: ETT, MM pink/moist, PERRL,  Neuro: Alert and oriented x3, non-focal  CV: s1s2 regular rate and rhythm, no murmur, rubs, or gallops,  PULM:  Rhonchi to right side, tolerating vent, no increased work of breath, CXR pending  GI: soft, bowel sounds active in all 4 quadrants, non-tender, non-distended Extremities: warm/dry, no edema  Skin: no rashes or lesions  Resolved Hospital Problem list     Assessment & Plan:  Acute hemorraghic stoke  -Stroke CT on admit revealed, acute right thalamic hemorrhage with intraventricular extension with estimated intra-axial blood volume 25 mL. Mild Regional brain edema.  Trace leftward midline shift P: Management per neurology  Maintain neuro protective measures; goal for eurothermia, euglycemia, eunatermia, normoxia, and PCO2 goal of 35-40 Nutrition and bowel regiment  Seizure precautions  Aspirations precautions  Frequent neuro checks Strict BP control per neuro  Continue Cleviprix   Acute Hypoxic Respiratory Failure  Concern for aspiration event  -Secondary above  Hx of OSA  Previous smoker  P: Continue ventilator support with lung protective strategies  Wean PEEP and FiO2 for sats greater than 90%. Head of bed elevated 30 degrees. Plateau pressures less than 30 cm H20.  Follow intermittent chest x-ray and ABG.   SAT/SBT as tolerated, mentation preclude extubation  Ensure adequate pulmonary hygiene  Follow cultures  VAP bundle in place  PAD protocol Empiric antibiotics   A-fin on coumadin  HTN HLD P: Hold home anticoagulant  Continuous telemetry  Hold home oral antihypertensives  Continue Cleviprix   Hx of diabetes  P: Hold home medications  SSI  CBG checks q 4hrs CBG goal 140-180  Best Practice (right click and "Reselect all SmartList Selections" daily)   Diet/type: NPO DVT prophylaxis: SCD GI prophylaxis: PPI Lines: N/A Foley:  N/A Code Status:  full code Last date of multidisciplinary goals of care discussion: Son updated at bedside am of 1/22  Labs   CBC: Recent Labs  Lab 09/20/21 0908 09/20/21 0913  WBC 9.1  --   NEUTROABS 6.6  --   HGB 16.2 15.3  HCT 46.1 45.0  MCV 86.7  --   PLT 248  --  Basic Metabolic Panel: Recent Labs  Lab 09/20/21 0908 09/20/21 0913  NA 138 139  K 3.7 3.6  CL 101 102  CO2 23  --   GLUCOSE 371* 373*  BUN 18 20  CREATININE 1.06 0.80  CALCIUM 9.9  --    GFR: CrCl cannot be calculated (Unknown ideal weight.). Recent Labs  Lab 09/20/21 0908  WBC 9.1    Liver Function Tests: Recent Labs  Lab 09/20/21 0908  AST 24  ALT 27  ALKPHOS 94  BILITOT 0.8  PROT 7.0   ALBUMIN 4.1   No results for input(s): LIPASE, AMYLASE in the last 168 hours. No results for input(s): AMMONIA in the last 168 hours.  ABG    Component Value Date/Time   TCO2 27 09/20/2021 0913     Coagulation Profile: Recent Labs  Lab 09/20/21 0908  INR 2.1*    Cardiac Enzymes: No results for input(s): CKTOTAL, CKMB, CKMBINDEX, TROPONINI in the last 168 hours.  HbA1C: No results found for: HGBA1C  CBG: No results for input(s): GLUCAP in the last 168 hours.  Review of Systems:   Unable to assess   Past Medical History:  He,  has no past medical history on file.   Surgical History:     Social History:      Family History:  His family history is not on file.   Allergies Not on File   Home Medications  Prior to Admission medications   Not on File     Critical care time:     Performed by: Lucilia Yanni D. Harris  Total critical care time: 40 minutes  Critical care time was exclusive of separately billable procedures and treating other patients.  Critical care was necessary to treat or prevent imminent or life-threatening deterioration.  Critical care was time spent personally by me on the following activities: development of treatment plan with patient and/or surrogate as well as nursing, discussions with consultants, evaluation of patient's response to treatment, examination of patient, obtaining history from patient or surrogate, ordering and performing treatments and interventions, ordering and review of laboratory studies, ordering and review of radiographic studies, pulse oximetry and re-evaluation of patient's condition.  Coretha Creswell D. Kenton Kingfisher, NP-C [facility] Personal contact information can be found on Amion  09/20/2021, 11:07 AM

## 2021-09-20 NOTE — ED Triage Notes (Signed)
Pt from home via [facility]. Code stroke was called in the field by Ems. Pts LKW 0200. Pt arrived home at 0200 from work. Pt was found on the floor by Ems this AM.

## 2021-09-20 NOTE — ED Provider Notes (Signed)
[facility] EMERGENCY DEPARTMENT Provider Note   CSN: [id number] Arrival date & time: 09/20/21  1470  An emergency department physician performed an initial assessment on this suspected stroke patient at 0908.  History  Chief Complaint  Patient presents with   Code Stroke    Caleb Vaughan is a 61 y.o. male.  Pt is a 61 yo wm with a hx of afib on coumadin, htn, high cholesterol and dm.  Pt came home from work last night around 0200.  His son heard him yelling out around 0700 and found him on the ground with left sided weakness and slurred speech.  The son called EMS.  EMS called a code stroke.  Pt is unable to give any hx.      Home Medications Prior to Admission medications   Medication Sig Start Date End Date Taking? Authorizing Provider  amLODipine (NORVASC) 10 MG tablet Take 10 mg by mouth daily. 08/13/21   [provider]  atorvastatin (LIPITOR) 80 MG tablet Take 80 mg by mouth daily. 09/16/21   [provider]  carvedilol (COREG) 25 MG tablet Take 25 mg by mouth 2 (two) times daily. 07/16/21   [provider]  flecainide (TAMBOCOR) 150 MG tablet Take 150 mg by mouth 2 (two) times daily. 07/09/21   [provider]  JARDIANCE 25 MG TABS tablet Take 25 mg by mouth daily. 09/16/21   [provider]  LEVEMIR FLEXTOUCH 100 UNIT/ML FlexTouch Pen Inject into the skin. 09/17/21   [provider]  losartan-hydrochlorothiazide (HYZAAR) 100-25 MG tablet Take 1 tablet by mouth daily. 06/18/21   [provider]  metFORMIN (GLUCOPHAGE) 1000 MG tablet Take 1,000 mg by mouth 2 (two) times daily. 07/03/21   [provider]  warfarin (COUMADIN) 5 MG tablet Take 10-15 mg by mouth See admin instructions. Take 2 tablets QD, except for Thursdays and Saturdays pt takes 3 tabs or as directed by [facility]. 08/11/21   [provider]      Allergies    Patient has no known allergies.    Review of  Systems   Review of Systems  Unable to perform ROS: Acuity of condition   Physical Exam Updated Vital Signs BP 109/64    Pulse 88    Resp (!) 22    Wt 93 kg    SpO2 100%  Physical Exam Vitals and nursing note reviewed.  Constitutional:      General: He is in acute distress.  HENT:     Head: Normocephalic and atraumatic.     Right Ear: External ear normal.     Left Ear: External ear normal.     Nose: Nose normal.     Mouth/Throat:     Mouth: Mucous membranes are dry.  Eyes:     Extraocular Movements: Extraocular movements intact.     Conjunctiva/sclera: Conjunctivae normal.     Pupils: Pupils are equal, round, and reactive to light.  Cardiovascular:     Rate and Rhythm: Normal rate and regular rhythm.     Pulses: Normal pulses.     Heart sounds: Normal heart sounds.  Pulmonary:     Effort: Pulmonary effort is normal.     Breath sounds: Normal breath sounds.  Abdominal:     General: Abdomen is flat. Bowel sounds are normal.     Palpations: Abdomen is soft.  Musculoskeletal:        General: Normal range of motion.     Cervical back: Normal  range of motion and neck supple.  Skin:    General: Skin is warm.     Capillary Refill: Capillary refill takes less than 2 seconds.  Neurological:     Comments: Left sided paresis and neglect.  Speech is very difficult to understand.    Psychiatric:     Comments: Unable to assess    ED Results / Procedures / Treatments   Labs (all labs ordered are listed, but only abnormal results are displayed) Labs Reviewed  PROTIME-INR - Abnormal; Notable for the following components:      Result Value   Prothrombin Time 23.7 (*)    INR 2.1 (*)    All other components within normal limits  COMPREHENSIVE METABOLIC PANEL - Abnormal; Notable for the following components:   Glucose, Bld 371 (*)    All other components within normal limits  I-STAT CHEM 8, ED - Abnormal; Notable for the following components:   Glucose, Bld 373 (*)    All other  components within normal limits  APTT  CBC  DIFFERENTIAL  PROTIME-INR  HIV ANTIBODY (ROUTINE TESTING W REFLEX)  BLOOD GAS, ARTERIAL  URINALYSIS, ROUTINE W REFLEX MICROSCOPIC  CBG MONITORING, ED    EKG EKG Interpretation  Date/Time:  Sunday September 20 2021 09:29:30 EST Ventricular Rate:  84 PR Interval:  149 QRS Duration: 97 QT Interval:  383 QTC Calculation: 453 R Axis:   6 Text Interpretation: Sinus rhythm Nonspecific T abnormalities, inferior leads No previous ECGs available Confirmed by Isla Pence [phone]) on 09/20/2021 10:15:12 AM  Radiology DG Chest Portable 1 View  Result Date: 09/20/2021 CLINICAL DATA:  61 year old male right thalamic hemorrhage. Intubated. EXAM: PORTABLE CHEST 1 VIEW COMPARISON:  Chest radiographs 03/04/2020. FINDINGS: AP view at 1049 hours. Mainstem bronchus intubation on the right. Endotracheal tube retraction of 2 cm recommended. Enteric tube courses to the abdomen, tip not included. Lower lung volumes. Mediastinal contours within normal limits. Allowing for portable technique the lungs are clear. No pneumothorax or pleural effusion. No acute osseous abnormality identified. IMPRESSION: 1. Right mainstem bronchus intubation. ET tube retraction of 2 cm recommended with repeat portable chest x-ray to confirm placement. 2. Lower lung volumes with no acute cardiopulmonary abnormality. Electronically Signed   By: Genevie Ann M.D.   On: 09/20/2021 11:10   CT HEAD CODE STROKE WO CONTRAST  Result Date: 09/20/2021 CLINICAL DATA:  Code stroke.  61 year old male. EXAM: CT HEAD WITHOUT CONTRAST TECHNIQUE: Contiguous axial images were obtained from the base of the skull through the vertex without intravenous contrast. RADIATION DOSE REDUCTION: This exam was performed according to the departmental dose-optimization program which includes automated exposure control, adjustment of the mA and/or kV according to patient size and/or use of iterative reconstruction technique.  COMPARISON:  None available. FINDINGS: Brain: Hyperdense intra-axial hemorrhage centered at the right thalamus and posterior lentiform with intraventricular extension. Intra-axial component of blood encompasses 36 by 36 x 38 mm (AP by transverse by CC) for an estimated intra-axial blood volume of 25 mL. Moderate volume right lateral ventricle IVH, but no ventriculomegaly. Regional brain edema. Only trace leftward midline shift. Superimposed chronic appearing lacunar infarcts left thalamus and corona radiata. Normal basilar cisterns. No superimposed acute cortically based infarct. No other intracranial hemorrhage identified. Vascular: Calcified atherosclerosis at the skull base. No suspicious intracranial vascular hyperdensity. Skull: Negative. Sinuses/Orbits: Visualized paranasal sinuses and mastoids are clear. Other: Visualized orbit soft tissues are within normal limits. Visualized scalp soft tissues are within normal limits. ASPECTS [facility] Stroke Program Early  CT Score) Total score (0-10 with 10 being normal): Not applicable, acute intra-axial hemorrhage. IMPRESSION: 1. 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 Estimated intra-axial blood volume 25 mL. Mild Regional brain edema. Trace leftward midline shift. 2. Underlying chronic small vessel disease. 3. These results were communicated to Dr. Lorrin Goodell at 9:19 am on 09/20/2021 by text page via the [facility] messaging system. And was discussed with him by telephone at 09:20 . Electronically Signed   By: Genevie Ann M.D.   On: 09/20/2021 09:24    Procedures Procedure Name: Intubation Date/Time: 09/20/2021 11:08 AM Performed by: Isla Pence, MD Pre-anesthesia Checklist: Patient identified, Patient being monitored, Emergency Drugs available, Timeout performed and Suction available Oxygen Delivery Method: Non-rebreather mask Preoxygenation: Pre-oxygenation with 100% oxygen Induction Type: Rapid sequence Ventilation: Mask  ventilation with difficulty Laryngoscope Size: Glidescope and 4 Tube size: 7.0 mm Number of attempts: 2 Placement Confirmation: ETT inserted through vocal cords under direct vision, CO2 detector and Breath sounds checked- equal and bilateral Secured at: 24 cm Tube secured with: ETT holder Dental Injury: Teeth and Oropharynx as per pre-operative assessment  Difficulty Due To: Difficulty was anticipated and Difficult Airway- due to large tongue Future Recommendations: Recommend- induction with short-acting agent, and alternative techniques readily available Comments: The first attempt at intubation was unsuccessful due to tracheal stenosis below cords.  I was able to see the cords and place the tube through the cords, but it would not pass any further.  The second attempt with a 7.0 tube still met resistance below the cords, but I was able to pass it through.       Medications Ordered in ED Medications  clevidipine (CLEVIPREX) infusion 0.5 mg/mL (8 mg/hr Intravenous Rate/Dose Change 09/20/21 1117)   stroke: mapping our early stages of recovery book (has no administration in time range)  acetaminophen (TYLENOL) tablet 650 mg (has no administration in time range)    Or  acetaminophen (TYLENOL) 160 MG/5ML solution 650 mg (has no administration in time range)    Or  acetaminophen (TYLENOL) suppository 650 mg (has no administration in time range)  senna-docusate (Senokot-S) tablet 1 tablet (has no administration in time range)  pantoprazole (PROTONIX) injection 40 mg (has no administration in time range)  propofol (DIPRIVAN) 1000 MG/100ML infusion (50 mcg/kg/min  93 kg Intravenous Rate/Dose Change 09/20/21 1034)  fentaNYL (SUBLIMAZE) injection 50 mcg (has no administration in time range)  fentaNYL 255mcg in NS 238mL (22mcg/ml) infusion-PREMIX (has no administration in time range)  fentaNYL (SUBLIMAZE) bolus via infusion 50-100 mcg (has no administration in time range)  ipratropium-albuterol  (DUONEB) 0.5-2.5 (3) MG/3ML nebulizer solution 3 mL (has no administration in time range)  cefTRIAXone (ROCEPHIN) 2 g in sodium chloride 0.9 % 100 mL IVPB (has no administration in time range)  azithromycin (ZITHROMAX) 500 mg in sodium chloride 0.9 % 250 mL IVPB (has no administration in time range)    Followed by  azithromycin (ZITHROMAX) 250 mg in dextrose 5 % 125 mL IVPB (has no administration in time range)  polyethylene glycol (MIRALAX / GLYCOLAX) packet 17 g (has no administration in time range)  docusate (COLACE) 50 MG/5ML liquid 100 mg (has no administration in time range)  sodium chloride flush (NS) 0.9 % injection 3 mL (3 mLs Intravenous Given 09/20/21 0950)  phytonadione (VITAMIN K) 10 mg in dextrose 5 % 50 mL IVPB (0 mg Intravenous Stopped 09/20/21 1025)  prothrombin complex conc human (KCENTRA) IVPB 2,186 Units (0 Units Intravenous Stopped 09/20/21 1010)  labetalol (  NORMODYNE) injection 10 mg (10 mg Intravenous Given 09/20/21 0940)    ED Course/ Medical Decision Making/ A&P                           Medical Decision Making Amount and/or Complexity of Data Reviewed Labs: ordered. Radiology: ordered.  Risk Prescription drug management. Decision regarding hospitalization.   Pt was taken directly to the CT scanner after arrival.  Unfortunately, he has a large head bleed.  He is also hypertensive and is on coumadin for afib.    For they hypertension, pt was given labetalol and Cleviprex.  BP now coming down.  For the [place] on coumadin, he was given Vitamin K and KCentra.  He has worsening mental status and was unable to give a cough.  He had periods of apnea.  Decision made to intubate.  Pt had some tracheal stenosis below cords and 7.5 tube unable to be advanced.  I was able to place a 7.0 tube, but it was difficult.  Pt's O2 sat dropped briefly during attempts, but he was bagged up.  He did require 2 person hold to bag effectively.  Pt is in NSR now, but has a hx of afib.  He  has a chadvasc of 2 (htn, dm), but is unable to be anticoagulated currently due to [place].  Dr. Lorrin Goodell (neurology) will admit.    CRITICAL CARE Performed by: Isla Pence   Total critical care time: 45 minutes  Critical care time was exclusive of separately billable procedures and treating other patients.  Critical care was necessary to treat or prevent imminent or life-threatening deterioration.  Critical care was time spent personally by me on the following activities: development of treatment plan with patient and/or surrogate as well as nursing, discussions with consultants, evaluation of patient's response to treatment, examination of patient, obtaining history from patient or surrogate, ordering and performing treatments and interventions, ordering and review of laboratory studies, ordering and review of radiographic studies, pulse oximetry and re-evaluation of patient's condition.         Final Clinical Impression(s) / ED Diagnoses Final diagnoses:  Right-sided nontraumatic intracerebral hemorrhage, unspecified cerebral location ([place])  Anticoagulated on Coumadin  Hypertensive emergency  Tracheal stenosis    Rx / DC Orders ED Discharge Orders     None         Isla Pence, MD 09/20/21 1118

## 2021-09-20 NOTE — Progress Notes (Signed)
Patient transported on vent from ED to 4N16 without complications.

## 2021-09-20 NOTE — Progress Notes (Signed)
Pt was transported to CT via ventilator with no apparent complications. Pt is now back in 4N16 and is currently stable.  RT will continue to monitor.

## 2021-09-20 NOTE — Progress Notes (Signed)
Patient's SBP's under goal. Clevi off and Prop off, fentanyl at 25. CCM aware, levo to be ordered to maintain goal.

## 2021-09-20 NOTE — Code Documentation (Signed)
Stroke Response Nurse Documentation Code Documentation  Caleb Vaughan is a 61 y.o. male arriving to [facility] ED via [facility] on 09/20/21 with past medical hx of HTN, HLD, obesity, CVA, DM 2, and OSA.Marland Kitchen On warfarin daily. Code stroke was activated by EMS (EMS, ED).   Patient from home where he was LKW at 0200 and now complaining of left sided weakness, dysarthria, and right fixed gaze.  Stroke team at the bedside on patient arrival. Labs drawn and patient cleared for CT by Dr. Dr. Gilford Raid. Patient to CT with team. NIHSS 14, see documentation for details and code stroke times. Patient with decreased LOC, disoriented, right gaze preference , left arm weakness, and dysarthria  on exam. The following imaging was completed:  CT (CT, CTA head and neck, CTP). Patient is not a candidate for IV Thrombolytic due to [place] found with extension into right ventricle.  Care/Plan: Patient to be transferred to 4N ICU and neurosurgery evaluation.   Bedside handoff with ED RN Legrand Como.    [place]  Rapid Response RN

## 2021-09-20 NOTE — Progress Notes (Signed)
Peripherally Inserted Central Catheter Placement  The IV Nurse has discussed with the patient and/or persons authorized to consent for the patient, the purpose of this procedure and the potential benefits and risks involved with this procedure.  The benefits include less needle sticks, lab draws from the catheter, and the patient may be discharged home with the catheter. Risks include, but not limited to, infection, bleeding, blood clot (thrombus formation), and puncture of an artery; nerve damage and irregular heartbeat and possibility to perform a PICC exchange if needed/ordered by physician.  Alternatives to this procedure were also discussed.  Bard Power PICC patient education guide, fact sheet on infection prevention and patient information card has been provided to patient /or left at bedside.  Telephone consent obtained from son due to altered mental status.  PICC Placement Documentation  PICC Triple Lumen 123XX123 PICC Right Basilic 36 cm 0 cm (Active)  Indication for Insertion or Continuance of Line Administration of hyperosmolar/irritating solutions (i.e. TPN, Vancomycin, etc.);Limited venous access - need for IV therapy >5 days (PICC only) 09/20/21 1705  Exposed Catheter (cm) 0 cm 09/20/21 1705  Site Assessment Clean;Dry;Intact 09/20/21 1705  Lumen #1 Status Flushed;Saline locked;Blood return noted 09/20/21 1705  Lumen #2 Status Flushed;Saline locked;Blood return noted 09/20/21 1705  Lumen #3 Status Flushed;Saline locked;Blood return noted 09/20/21 1705  Dressing Type Transparent 09/20/21 1705  Dressing Status Clean;Dry;Intact 09/20/21 1705  Antimicrobial disc in place? Yes 09/20/21 1705  Safety Lock Not Applicable [id number]  Line Care Connections checked and tightened 09/20/21 1705  Line Adjustment (NICU/IV Team Only) No 09/20/21 1705  Dressing Intervention New dressing 09/20/21 [id number]  Dressing Change Due 09/27/21 09/20/21 [place], Nicolette Bang 09/20/2021, 5:06  PM

## 2021-09-21 ENCOUNTER — Inpatient Hospital Stay (HOSPITAL_COMMUNITY): Payer: No Typology Code available for payment source

## 2021-09-21 DIAGNOSIS — I480 Paroxysmal atrial fibrillation: Secondary | ICD-10-CM | POA: Diagnosis present

## 2021-09-21 DIAGNOSIS — I615 Nontraumatic intracerebral hemorrhage, intraventricular: Secondary | ICD-10-CM | POA: Diagnosis not present

## 2021-09-21 DIAGNOSIS — J690 Pneumonitis due to inhalation of food and vomit: Secondary | ICD-10-CM | POA: Diagnosis present

## 2021-09-21 DIAGNOSIS — J398 Other specified diseases of upper respiratory tract: Secondary | ICD-10-CM

## 2021-09-21 DIAGNOSIS — I6389 Other cerebral infarction: Secondary | ICD-10-CM

## 2021-09-21 DIAGNOSIS — I4821 Permanent atrial fibrillation: Secondary | ICD-10-CM

## 2021-09-21 DIAGNOSIS — I619 Nontraumatic intracerebral hemorrhage, unspecified: Secondary | ICD-10-CM

## 2021-09-21 DIAGNOSIS — Z7901 Long term (current) use of anticoagulants: Secondary | ICD-10-CM

## 2021-09-21 DIAGNOSIS — E119 Type 2 diabetes mellitus without complications: Secondary | ICD-10-CM

## 2021-09-21 DIAGNOSIS — G4733 Obstructive sleep apnea (adult) (pediatric): Secondary | ICD-10-CM | POA: Diagnosis present

## 2021-09-21 DIAGNOSIS — N179 Acute kidney failure, unspecified: Secondary | ICD-10-CM | POA: Diagnosis not present

## 2021-09-21 DIAGNOSIS — J9691 Respiratory failure, unspecified with hypoxia: Secondary | ICD-10-CM | POA: Diagnosis present

## 2021-09-21 DIAGNOSIS — Z930 Tracheostomy status: Secondary | ICD-10-CM

## 2021-09-21 DIAGNOSIS — E870 Hyperosmolality and hypernatremia: Secondary | ICD-10-CM

## 2021-09-21 LAB — CBC
HCT: 37.5 % — ABNORMAL LOW (ref 39.0–52.0)
Hemoglobin: 12.5 g/dL — ABNORMAL LOW (ref 13.0–17.0)
MCH: 30.2 pg (ref 26.0–34.0)
MCHC: 33.3 g/dL (ref 30.0–36.0)
MCV: 90.6 fL (ref 80.0–100.0)
Platelets: 222 10*3/uL (ref 150–400)
RBC: 4.14 MIL/uL — ABNORMAL LOW (ref 4.22–5.81)
RDW: 13.6 % (ref 11.5–15.5)
WBC: 14.5 10*3/uL — ABNORMAL HIGH (ref 4.0–10.5)
nRBC: 0 % (ref 0.0–0.2)

## 2021-09-21 LAB — LIPID PANEL
Cholesterol: 129 mg/dL (ref 0–200)
HDL: 29 mg/dL — ABNORMAL LOW (ref >40)
LDL Cholesterol: 52 mg/dL (ref 0–99)
Total CHOL/HDL Ratio: 4.4 RATIO
Triglycerides: 242 mg/dL — ABNORMAL HIGH (ref <150)
VLDL: 48 mg/dL — ABNORMAL HIGH (ref 0–40)

## 2021-09-21 LAB — GLUCOSE, CAPILLARY
Glucose-Capillary: 162 mg/dL — ABNORMAL HIGH (ref 70–99)
Glucose-Capillary: 206 mg/dL — ABNORMAL HIGH (ref 70–99)
Glucose-Capillary: 142 mg/dL — ABNORMAL HIGH (ref 70–99)
Glucose-Capillary: 114 mg/dL — ABNORMAL HIGH (ref 70–99)
Glucose-Capillary: 158 mg/dL — ABNORMAL HIGH (ref 70–99)
Glucose-Capillary: 165 mg/dL — ABNORMAL HIGH (ref 70–99)

## 2021-09-21 LAB — HEMOGLOBIN A1C
Hgb A1c MFr Bld: 10.2 % — ABNORMAL HIGH (ref 4.8–5.6)
Mean Plasma Glucose: 246 mg/dL

## 2021-09-21 LAB — BASIC METABOLIC PANEL
Anion gap: 7 (ref 5–15)
BUN: 27 mg/dL — ABNORMAL HIGH (ref 6–20)
CO2: 27 mmol/L (ref 22–32)
Calcium: 8.6 mg/dL — ABNORMAL LOW (ref 8.9–10.3)
Chloride: 116 mmol/L — ABNORMAL HIGH (ref 98–111)
Creatinine, Ser: 1.66 mg/dL — ABNORMAL HIGH (ref 0.61–1.24)
GFR, Estimated: 47 mL/min — ABNORMAL LOW (ref >60)
Glucose, Bld: 158 mg/dL — ABNORMAL HIGH (ref 70–99)
Potassium: 3.3 mmol/L — ABNORMAL LOW (ref 3.5–5.1)
Sodium: 150 mmol/L — ABNORMAL HIGH (ref 135–145)

## 2021-09-21 LAB — ECHOCARDIOGRAM COMPLETE
AR max vel: 1.88 cm2
AV Area VTI: 2.08 cm2
AV Area mean vel: 2.13 cm2
AV Mean grad: 9 mmHg
AV Peak grad: 19.2 mmHg
Ao pk vel: 2.19 m/s
Area-P 1/2: 3.17 cm2
Height: 64 in
S' Lateral: 3.1 cm
Weight: 3280 oz

## 2021-09-21 LAB — TRIGLYCERIDES: Triglycerides: 263 mg/dL — ABNORMAL HIGH (ref <150)

## 2021-09-21 LAB — PROTIME-INR
INR: 1.1 (ref 0.8–1.2)
INR: 1.3 — ABNORMAL HIGH (ref 0.8–1.2)
Prothrombin Time: 14 seconds (ref 11.4–15.2)
Prothrombin Time: 15.8 seconds — ABNORMAL HIGH (ref 11.4–15.2)

## 2021-09-21 LAB — SODIUM
Sodium: 154 mmol/L — ABNORMAL HIGH (ref 135–145)
Sodium: 152 mmol/L — ABNORMAL HIGH (ref 135–145)

## 2021-09-21 LAB — RAPID URINE DRUG SCREEN, HOSP PERFORMED
Amphetamines: NOT DETECTED
Barbiturates: NOT DETECTED
Benzodiazepines: NOT DETECTED
Cocaine: NOT DETECTED
Opiates: NOT DETECTED
Tetrahydrocannabinol: NOT DETECTED

## 2021-09-21 LAB — MAGNESIUM
Magnesium: 2.3 mg/dL (ref 1.7–2.4)
Magnesium: 2.6 mg/dL — ABNORMAL HIGH (ref 1.7–2.4)

## 2021-09-21 LAB — PHOSPHORUS
Phosphorus: 3.5 mg/dL (ref 2.5–4.6)
Phosphorus: 1.6 mg/dL — ABNORMAL LOW (ref 2.5–4.6)

## 2021-09-21 MED ORDER — HEPARIN SODIUM (PORCINE) 5000 UNIT/ML IJ SOLN
5000.0000 [IU] | Freq: Three times a day (TID) | INTRAMUSCULAR | Status: DC
  Administered 2021-09-22 – 2021-10-05 (×40): 5000 [IU] via SUBCUTANEOUS
  Filled 2021-09-21 (×40): qty 1

## 2021-09-21 MED ORDER — PANTOPRAZOLE 2 MG/ML SUSPENSION
40.0000 mg | Freq: Every day | ORAL | Status: DC
  Administered 2021-09-21 – 2021-10-15 (×25): 40 mg
  Filled 2021-09-21 (×25): qty 20

## 2021-09-21 MED ORDER — VITAL HIGH PROTEIN PO LIQD
1000.0000 mL | ORAL | Status: AC
  Administered 2021-09-21: 1000 mL

## 2021-09-21 MED ORDER — POTASSIUM CHLORIDE 20 MEQ PO PACK
40.0000 meq | PACK | Freq: Once | ORAL | Status: AC
Start: 2021-09-21 — End: 2021-09-21
  Administered 2021-09-21: 40 meq
  Filled 2021-09-21: qty 2

## 2021-09-21 MED ORDER — PROSOURCE TF PO LIQD
45.0000 mL | Freq: Two times a day (BID) | ORAL | Status: DC
  Administered 2021-09-21: 45 mL
  Filled 2021-09-21: qty 45

## 2021-09-21 MED ORDER — PERFLUTREN LIPID MICROSPHERE
1.0000 mL | INTRAVENOUS | Status: AC | PRN
  Administered 2021-09-21: 3 mL via INTRAVENOUS
  Filled 2021-09-21: qty 10

## 2021-09-21 MED ORDER — FLECAINIDE ACETATE 50 MG PO TABS
150.0000 mg | ORAL_TABLET | Freq: Two times a day (BID) | ORAL | Status: DC
  Administered 2021-09-21 – 2021-09-23 (×6): 150 mg
  Filled 2021-09-21 (×8): qty 1

## 2021-09-21 MED ORDER — PROSOURCE TF PO LIQD
90.0000 mL | Freq: Two times a day (BID) | ORAL | Status: DC
  Administered 2021-09-21 – 2021-09-23 (×5): 90 mL
  Filled 2021-09-21 (×6): qty 90

## 2021-09-21 MED ORDER — IPRATROPIUM-ALBUTEROL 0.5-2.5 (3) MG/3ML IN SOLN
3.0000 mL | Freq: Four times a day (QID) | RESPIRATORY_TRACT | Status: DC | PRN
  Administered 2021-09-22: 05:00:00 3 mL via RESPIRATORY_TRACT
  Filled 2021-09-21 (×2): qty 3

## 2021-09-21 MED ORDER — VITAL AF 1.2 CAL PO LIQD
1000.0000 mL | ORAL | Status: DC
  Administered 2021-09-21 – 2021-09-22 (×2): 1000 mL

## 2021-09-21 MED ORDER — ALBUMIN HUMAN 5 % IV SOLN
25.0000 g | Freq: Once | INTRAVENOUS | Status: AC
  Administered 2021-09-21: 25 g via INTRAVENOUS
  Filled 2021-09-21: qty 500

## 2021-09-21 MED ORDER — INSULIN ASPART 100 UNIT/ML IJ SOLN
5.0000 [IU] | INTRAMUSCULAR | Status: DC
  Administered 2021-09-21 – 2021-10-11 (×117): 5 [IU] via SUBCUTANEOUS

## 2021-09-21 MED ORDER — VITAL HIGH PROTEIN PO LIQD
1000.0000 mL | ORAL | Status: DC
  Administered 2021-09-21: 1000 mL

## 2021-09-21 MED ORDER — GADOBUTROL 1 MMOL/ML IV SOLN
10.0000 mL | Freq: Once | INTRAVENOUS | Status: AC | PRN
  Administered 2021-09-21: 10 mL via INTRAVENOUS

## 2021-09-21 MED ORDER — VITAL HIGH PROTEIN PO LIQD: 1000.0000 mL | ORAL | Status: DC

## 2021-09-21 NOTE — Progress Notes (Signed)
eLink Physician-Brief Progress Note Patient Name: Caleb Vaughan DOB: 1961/03/29 MRN: [id number]   Date of Service  09/21/2021  HPI/Events of Note  Bedside RN requesting right soft wrist restraint to prevent self-extubation, he is plegic on the left. He is intubated and mechanically ventilated.  eICU Interventions  Right soft wrist restraint ordered.        Thomasene Lot Baran Kuhrt 09/21/2021, 10:20 PM

## 2021-09-21 NOTE — Progress Notes (Addendum)
STROKE TEAM PROGRESS NOTE   INTERVAL HISTORY No family is at the bedside.  Nursing reports that he's had a low grade fever- 99.7. Consider adding Unasyn if it doesn't resolve. Fentanyl and propofol infusing for sedation. 3% saline- Na- 150 K 3.3- replaced. Patient is following commands with the right side.   Vitals:   09/21/21 0900 09/21/21 1000 09/21/21 1100 09/21/21 1200  BP: (!) 142/63 135/64 135/67 (!) 113/52  Pulse: 65 65 66 61  Resp: 18 15 12 17   Temp: 99.9 F (37.7 C) 99.9 F (37.7 C) 100 F (37.8 C) 99.5 F (37.5 C)  TempSrc:      SpO2: 95% 93% 93% 90%  Weight:      Height:       CBC:  Recent Labs  Lab 09/20/21 0908 09/20/21 0913 09/20/21 1146 09/21/21 0554  WBC 9.1  --   --  14.5*  NEUTROABS 6.6  --   --   --   HGB 16.2   < > 14.6 12.5*  HCT 46.1   < > 43.0 37.5*  MCV 86.7  --   --  90.6  PLT 248  --   --  222   < > = values in this interval not displayed.   Basic Metabolic Panel:  Recent Labs  Lab 09/20/21 0908 09/20/21 0913 09/20/21 1146 09/20/21 1347 09/20/21 1954 09/21/21 0554  NA 138 139 137   < > 145 150*  K 3.7 3.6 3.5  --   --  3.3*  CL 101 102  --   --   --  116*  CO2 23  --   --   --   --  27  GLUCOSE 371* 373*  --   --   --  158*  BUN 18 20  --   --   --  27*  CREATININE 1.06 0.80  --   --   --  1.66*  CALCIUM 9.9  --   --   --   --  8.6*  MG  --   --   --   --   --  2.3  PHOS  --   --   --   --   --  3.5   < > = values in this interval not displayed.   Lipid Panel:  Recent Labs  Lab 09/21/21 0554  CHOL 129  TRIG 242*   263*  HDL 29*  CHOLHDL 4.4  VLDL 48*  LDLCALC 52   HgbA1c: No results for input(s): HGBA1C in the last 168 hours. Urine Drug Screen:  Recent Labs  Lab 09/21/21 0303  LABOPIA NONE DETECTED  COCAINSCRNUR NONE DETECTED  LABBENZ NONE DETECTED  AMPHETMU NONE DETECTED  THCU NONE DETECTED  LABBARB NONE DETECTED    Alcohol Level No results for input(s): ETH in the last 168 hours.  IMAGING past 24 hours CT  HEAD WO CONTRAST  Result Date: 09/20/2021 CLINICAL DATA:  Intracranial hemorrhage follow-up EXAM: CT HEAD WITHOUT CONTRAST TECHNIQUE: Contiguous axial images were obtained from the base of the skull through the vertex without intravenous contrast. RADIATION DOSE REDUCTION: This exam was performed according to the departmental dose-optimization program which includes automated exposure control, adjustment of the mA and/or kV according to patient size and/or use of iterative reconstruction technique. COMPARISON:  CT head earlier today 09/20/2021 FINDINGS: Brain: High-density hemorrhage centered in the right thalamus unchanged. The hematoma measures approximately 3.2 x 3.2 x 3.5 cm. There is intraventricular extension primarily in the  right lateral ventricle also unchanged. No hydrocephalus. Mild midline shift to the left unchanged. No acute infarct or mass lesion identified.  Mild atrophy. Vascular: Negative for hyperdense vessel. Atherosclerotic calcification in the carotid and vertebral arteries bilaterally. Skull: Negative Sinuses/Orbits: Negative Other: None IMPRESSION: No change from earlier today. Acute hematoma right thalamus with intraventricular hemorrhage. No hydrocephalus. Electronically Signed   By: Franchot Gallo M.D.   On: 09/20/2021 15:35   MR ANGIO HEAD WO CONTRAST  Result Date: 09/21/2021 CLINICAL DATA:  61 year old male with acute right thalamic hemorrhage. EXAM: MRI HEAD WITHOUT AND WITH CONTRAST MRA HEAD WITHOUT CONTRAST TECHNIQUE: Multiplanar, multi-echo pulse sequences of the brain and surrounding structures were acquired without and with intravenous contrast. Angiographic images of the Circle of Willis were acquired using MRA technique without intravenous contrast. CONTRAST:  62mL GADAVIST GADOBUTROL 1 MMOL/ML IV SOLN COMPARISON:  Head CT 09/20/2021.  Brain MRI 05/21/2010. FINDINGS: MRI HEAD FINDINGS Brain: Heterogeneous mostly isointense, mixed signal T2 intra-axial hemorrhage centered at  the right thalamus with surrounding edema. Blood products encompass 36 by 37 x 36 mm (AP by transverse by CC), approximately 24 mL which is stable since yesterday. Edema tracks into the right midbrain. Small to moderate volume of intraventricular hemorrhage, mostly in the right lateral ventricle. Trace blood layering in the left occipital horn also. No ventriculomegaly. Trace leftward midline shift. Basilar cisterns remain patent. Susceptibility on DWI associated with the acute blood. No larger area of restricted diffusion. Following contrast no abnormal enhancement in or around the hematoma. But there are other chronic microhemorrhages scattered in the bilateral deep gray matter nuclei, and occasionally elsewhere in both cerebral hemispheres. Several chronic microhemorrhages also noted in the brainstem and cerebellar vermis. But extent is less than amyloid angiopathy. No superficial siderosis identified. No dural thickening identified. Chronic lacunar infarct left corona radiata and basal ganglia. Chronic lacunar infarcts in the left thalamus and occasionally the brainstem. Negative pituitary and cervicomedullary junction. Vascular: Major intracranial vascular flow voids are stable since 2011. Generalized intracranial artery dolichoectasia, especially the dominant distal right vertebral artery. Mild chronic mass effect on the left cerebellopontine angle from dolichoectatic vertebrobasilar junction. Major dural venous sinuses are enhancing and appear to be patent. Skull and upper cervical spine: Negative visible cervical spine. Visualized bone marrow signal is within normal limits. Sinuses/Orbits: Negative. Other: Visible internal auditory structures appear normal. Small volume fluid layering in the pharynx. MRA HEAD FINDINGS Anterior circulation: Antegrade flow in both ICA siphons. Mild siphon irregularity. No siphon stenosis. Normal ophthalmic artery origins. Small right ICA terminus infundibulum suspected (normal  variant). Patent carotid termini, MCA and ACA origins. Diminutive or absent anterior communicating artery. Visible bilateral MCA and ACA branches appear normal aside from some tortuosity. No flow signal associated with the acute intra-axial hemorrhage. No regional abnormal vascularity identified. Posterior circulation: Antegrade flow in the dolichoectasia close tear circulation. No distal vertebral or basilar stenosis. Normal SCA and PCA origins. Posterior communicating arteries are diminutive or absent. Bilateral PCA branches are mildly tortuous and within normal limits. Anatomic variants: None. IMPRESSION: 1. Stable right thalamic hemorrhage since presentation. Regional edema, tracking into the midbrain. 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 No increased intracranial mass effect. 2. No evidence of underlying mass or vascular malformation. But abundant chronic micro-hemorrhages in the brain superimposed on chronic lacunar infarcts compatible with advanced small vessel disease. Blood product extent is less than expected for amyloid angiopathy. 3. Associated intracranial artery dolichoectasia, especially the posterior circulation. No circle-of-Willis stenosis or aneurysm.  Electronically Signed   By: Genevie Ann M.D.   On: 09/21/2021 07:43   MR BRAIN W WO CONTRAST  Result Date: 09/21/2021 CLINICAL DATA:  61 year old male with acute right thalamic hemorrhage. EXAM: MRI HEAD WITHOUT AND WITH CONTRAST MRA HEAD WITHOUT CONTRAST TECHNIQUE: Multiplanar, multi-echo pulse sequences of the brain and surrounding structures were acquired without and with intravenous contrast. Angiographic images of the Circle of Willis were acquired using MRA technique without intravenous contrast. CONTRAST:  51mL GADAVIST GADOBUTROL 1 MMOL/ML IV SOLN COMPARISON:  Head CT 09/20/2021.  Brain MRI 05/21/2010. FINDINGS: MRI HEAD FINDINGS Brain: Heterogeneous mostly isointense, mixed signal T2 intra-axial hemorrhage centered at the  right thalamus with surrounding edema. Blood products encompass 36 by 37 x 36 mm (AP by transverse by CC), approximately 24 mL which is stable since yesterday. Edema tracks into the right midbrain. Small to moderate volume of intraventricular hemorrhage, mostly in the right lateral ventricle. Trace blood layering in the left occipital horn also. No ventriculomegaly. Trace leftward midline shift. Basilar cisterns remain patent. Susceptibility on DWI associated with the acute blood. No larger area of restricted diffusion. Following contrast no abnormal enhancement in or around the hematoma. But there are other chronic microhemorrhages scattered in the bilateral deep gray matter nuclei, and occasionally elsewhere in both cerebral hemispheres. Several chronic microhemorrhages also noted in the brainstem and cerebellar vermis. But extent is less than amyloid angiopathy. No superficial siderosis identified. No dural thickening identified. Chronic lacunar infarct left corona radiata and basal ganglia. Chronic lacunar infarcts in the left thalamus and occasionally the brainstem. Negative pituitary and cervicomedullary junction. Vascular: Major intracranial vascular flow voids are stable since 2011. Generalized intracranial artery dolichoectasia, especially the dominant distal right vertebral artery. Mild chronic mass effect on the left cerebellopontine angle from dolichoectatic vertebrobasilar junction. Major dural venous sinuses are enhancing and appear to be patent. Skull and upper cervical spine: Negative visible cervical spine. Visualized bone marrow signal is within normal limits. Sinuses/Orbits: Negative. Other: Visible internal auditory structures appear normal. Small volume fluid layering in the pharynx. MRA HEAD FINDINGS Anterior circulation: Antegrade flow in both ICA siphons. Mild siphon irregularity. No siphon stenosis. Normal ophthalmic artery origins. Small right ICA terminus infundibulum suspected (normal  variant). Patent carotid termini, MCA and ACA origins. Diminutive or absent anterior communicating artery. Visible bilateral MCA and ACA branches appear normal aside from some tortuosity. No flow signal associated with the acute intra-axial hemorrhage. No regional abnormal vascularity identified. Posterior circulation: Antegrade flow in the dolichoectasia close tear circulation. No distal vertebral or basilar stenosis. Normal SCA and PCA origins. Posterior communicating arteries are diminutive or absent. Bilateral PCA branches are mildly tortuous and within normal limits. Anatomic variants: None. IMPRESSION: 1. Stable right thalamic hemorrhage since presentation. Regional edema, tracking into the midbrain. 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 No increased intracranial mass effect. 2. No evidence of underlying mass or vascular malformation. But abundant chronic micro-hemorrhages in the brain superimposed on chronic lacunar infarcts compatible with advanced small vessel disease. Blood product extent is less than expected for amyloid angiopathy. 3. Associated intracranial artery dolichoectasia, especially the posterior circulation. No circle-of-Willis stenosis or aneurysm. Electronically Signed   By: Genevie Ann M.D.   On: 09/21/2021 07:43   DG CHEST PORT 1 VIEW  Result Date: 09/21/2021 CLINICAL DATA:  Endotracheal tube EXAM: PORTABLE CHEST 1 VIEW COMPARISON:  Chest x-ray 09/20/2021 FINDINGS: Endotracheal tube tip is now 1.6 cm above the carina. Enteric tube tip below the diaphragm. Right PICC  tip near the cavoatrial junction. Cardiomediastinal silhouette is unchanged. Calcified plaques in the aortic arch. Pulmonary vasculature is normal. No focal consolidation identified. Improved opacities in the left lung since previous study. No pleural effusion or pneumothorax. IMPRESSION: Medical devices as described.  No acute abnormality identified. Electronically Signed   By: Jannifer Hick M.D.   On:  09/21/2021 11:10   ECHOCARDIOGRAM COMPLETE  Result Date: 09/21/2021    ECHOCARDIOGRAM REPORT   Patient Name:   Caleb Vaughan Date of Exam: 09/21/2021 Medical Rec #:  [id number]        Height:       64.0 in Accession #:    [id number]       Weight:       205.0 lb Date of Birth:  [birth year]         BSA:          1.977 m Patient Age:    60 years         BP:           142/63 mmHg Patient Gender: M                HR:           65 bpm. Exam Location:  Inpatient Procedure: 2D Echo Indications:    stroke  History:        Patient has no prior history of Echocardiogram examinations.                 Risk Factors:Sleep Apnea and Diabetes.  Sonographer:    Delcie Roch RDCS Referring Phys: [id number] [facility]  Sonographer Comments: Echo performed with patient supine and on artificial respirator. IMPRESSIONS  1. Left ventricular ejection fraction, by estimation, is 60 to 65%. The left ventricle has normal function. The left ventricle has no regional wall motion abnormalities. Left ventricular diastolic parameters were normal.  2. Right ventricular systolic function is normal. The right ventricular size is normal.  3. The mitral valve is normal in structure. No evidence of mitral valve regurgitation. No evidence of mitral stenosis.  4. The aortic valve is tricuspid. There is mild calcification of the aortic valve. There is mild thickening of the aortic valve. Aortic valve regurgitation is not visualized. Aortic valve sclerosis is present, with no evidence of aortic valve stenosis.  5. The inferior vena cava is normal in size with greater than 50% respiratory variability, suggesting right atrial pressure of 3 mmHg. FINDINGS  Left Ventricle: Left ventricular ejection fraction, by estimation, is 60 to 65%. The left ventricle has normal function. The left ventricle has no regional wall motion abnormalities. Definity contrast agent was given IV to delineate the left ventricular  endocardial borders. The left ventricular  internal cavity size was normal in size. There is no left ventricular hypertrophy. Left ventricular diastolic parameters were normal. Right Ventricle: The right ventricular size is normal. No increase in right ventricular wall thickness. Right ventricular systolic function is normal. Left Atrium: Left atrial size was normal in size. Right Atrium: Right atrial size was normal in size. Pericardium: There is no evidence of pericardial effusion. Mitral Valve: The mitral valve is normal in structure. No evidence of mitral valve regurgitation. No evidence of mitral valve stenosis. Tricuspid Valve: The tricuspid valve is normal in structure. Tricuspid valve regurgitation is not demonstrated. No evidence of tricuspid stenosis. Aortic Valve: The aortic valve is tricuspid. There is mild calcification of the aortic valve. There is mild thickening of the aortic valve. Aortic valve regurgitation  is not visualized. Aortic valve sclerosis is present, with no evidence of aortic valve stenosis. Aortic valve mean gradient measures 9.0 mmHg. Aortic valve peak gradient measures 19.2 mmHg. Aortic valve area, by VTI measures 2.08 cm. Pulmonic Valve: The pulmonic valve was normal in structure. Pulmonic valve regurgitation is not visualized. No evidence of pulmonic stenosis. Aorta: The aortic root is normal in size and structure. Venous: The inferior vena cava is normal in size with greater than 50% respiratory variability, suggesting right atrial pressure of 3 mmHg. IAS/Shunts: No atrial level shunt detected by color flow Doppler.  LEFT VENTRICLE PLAX 2D LVIDd:         4.70 cm   Diastology LVIDs:         3.10 cm   LV e' medial:    8.38 cm/s LV PW:         1.00 cm   LV E/e' medial:  14.4 LV IVS:        1.00 cm   LV e' lateral:   10.60 cm/s LVOT diam:     1.90 cm   LV E/e' lateral: 11.4 LV SV:         89 LV SV Index:   45 LVOT Area:     2.84 cm  RIGHT VENTRICLE             IVC RV S prime:     15.00 cm/s  IVC diam: 2.20 cm TAPSE (M-mode):  2.4 cm LEFT ATRIUM             Index        RIGHT ATRIUM           Index LA diam:        3.60 cm 1.82 cm/m   RA Area:     15.10 cm LA Vol (A2C):   40.6 ml 20.54 ml/m  RA Volume:   37.10 ml  18.77 ml/m LA Vol (A4C):   73.4 ml 37.13 ml/m LA Biplane Vol: 56.4 ml 28.53 ml/m  AORTIC VALVE AV Area (Vmax):    1.88 cm AV Area (Vmean):   2.13 cm AV Area (VTI):     2.08 cm AV Vmax:           219.00 cm/s AV Vmean:          137.000 cm/s AV VTI:            0.430 m AV Peak Grad:      19.2 mmHg AV Mean Grad:      9.0 mmHg LVOT Vmax:         145.00 cm/s LVOT Vmean:        103.000 cm/s LVOT VTI:          0.315 m LVOT/AV VTI ratio: 0.73  AORTA Ao Root diam: 2.90 cm Ao Asc diam:  3.10 cm MITRAL VALVE MV Area (PHT): 3.17 cm     SHUNTS MV Decel Time: 239 msec     Systemic VTI:  0.32 m MV E velocity: 121.00 cm/s  Systemic Diam: 1.90 cm MV A velocity: 103.00 cm/s MV E/A ratio:  1.17 Jenkins Rouge MD Electronically signed by Jenkins Rouge MD Signature Date/Time: 09/21/2021/11:31:42 AM    Final    [place] EKG SITE RITE  Result Date: 09/20/2021 If Site Rite image not attached, placement could not be confirmed due to current cardiac rhythm.   PHYSICAL EXAM  Physical Exam  Constitutional: Appears well-developed and well-nourished.  Cardiovascular: Normal rate and regular rhythm.  Respiratory: Effort  normal, non-labored breathing  Neuro: Intubated and sedated with fent and prop, drowsy, awakens with physical and verbal stimulation follows commands on the right, able to thumbs up, wiggle toes, and show 2 fingers with right extremities.  dolls eyes sluggish  Cranial Nerves: II, III,IV, VI: PERRLA, Right gaze preference- does not cross midline, Corneal reflex intact, but weak on the left;  V: Facial sensation is intact VII: Facial movement is obscured by ETAD VIII: Hearing is intact to voice X: Cough and gag intact XI: Head is midline XII: Unable to assess tongue Motor: Tone is normal. Bulk is normal. Left extremities  are flaccid.  Rt upper and lower extremity movement is purposeful. Follows commands with right extremities   ASSESSMENT/PLAN Caleb Vaughan is a 61 y.o. male with history of AFib on Coumadin s/p recent conversion, HTN, HLD, remote tobacco abuse, Cataract OU, glaucoma, obesity, CVA, DM II, and OSA who initially presented to the ED via EMS left sided weakness, dysarthria, and right fixed gaze. Coumadin reversed. Labetalol and cleviprex used for BP greater than 140 during CT. Patient was then intubated due to somnolence and inability to protect his airway. CT and MRI show stable right thalamic hemorrhage with intraventricular extension. ICH score 2. Febrile. Hypertonic saline infusing. Currently in NSR with flecainide continued. Holding home antihypertensive medications currently. Propofol and fentanyl infusing. No longer using cleviprex or norepinephrine (r/t hypotension after RSI) for BP control.   Stroke:  Acute right thalamic ICH and IVH likely secondary to hypertension in the setting of coumadin use Code stroke CT - Positive for acute right thalamic hemorrhage with intraventricular extension. Estimated intra-axial blood volume 25 mL. Mild Regional brain edema. Trace leftward midline shift. 1/22 Repeat CT shows no changes from code stroke CT MRI  Stable right thalamic hemorrhage since presentation. Regional edema, tracking into the midbrain. 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 No increased intracranial mass effect. MRA  Abundant chronic micro-hemorrhages in the brain superimposed on chronic lacunar infarcts compatible with advanced small vessel disease Carotid Doppler  1-39% stenosis in Left and Right ICA 2D Echo EF 60-65% LDL 52 HgbA1c No results found for requested labs within last 26280 hours. VTE prophylaxis - heparin warfarin daily prior to admission, now on No antithrombotic. ICH Therapy recommendations:  pending Disposition:  pending  Cerebral Edema with trace  left midline shift Stable MRI and repeat CT scan Next CT tomorrow morning  Hypertonic saline NA- 150-> 154  Atrial Fibrillation Home meds: Flecainide, coreg, coumadin Flecainide continued, patient is in NSR currently  Hypertension Home meds:  Amlodipine, coreg, losartan-hydrochlorothiazide Restart gradually BP goal less than 160 BP labile post intubation, levophed d/c'd  Hyperlipidemia Home meds:  None LDL 52, goal < 70  Diabetes type II Uncontrolled Home meds:  Metformin, jardiance HgbA1c No results found for requested labs within last 26280 hours., goal < 7.0 CBGs Recent Labs    09/21/21 0309 09/21/21 0735 09/21/21 1118  GLUCAP 162* 206* 142*    SSI  Other Stroke Risk Factors Cigarette smoker, advised to stop smoking ETOH use, alcohol level No results found for requested labs within last 26280 hours., advised to drink no more than 2 drink(s) a day Obesity, Body mass index is 35.19 kg/m., BMI >/= 30 associated with increased stroke risk, recommend weight loss, diet and exercise as appropriate  Obstructive sleep apnea  Other Active Problems Acute hypoxemic respiratory failure CCM consult for intubation and ventilator management Sedation with propofol and fentanyl Monitor triglycerides Difficult intubation r/t tracheal  stenosis  Hospital day # 1  Patient seen and examined by NP/APP with MD. MD to update note as needed.   Janine Ores, DNP, FNP-BC [facility] Pager: [phone]  ATTENDING NOTE: I reviewed above note and agree with the assessment and plan. Pt was seen and examined.   61 yo M with history of AFib on Coumadin s/p recent cardioversion, HTN, HLD, DM, obesity, CVA, and OSA admitted for left sided weakness, dysarthria, and right fixed gaze. CT showed right thalamic ICH with IVH. INR 2.1, Coumadin reversed with Kcentra. Patient was then intubated due to somnolence and inability to protect his airway. CT repeat and MRI show stable  hematoma and ventricle size, no hydro. EF 60-65%, CUS unremarkable. Repeat CT head pending in am. LDL 52 and A1C pending. UDS pending.   On exam, pt intubated on sedation, eyes closed, barely open with voice, but able to follow peripheral commands on the right hand and foot. With forced eye opening, eyes in mid position, not blinking to visual threat, doll's eyes absent, not tracking, pupils equal but sluggish to light. Corneal reflex present, right stronger than left, gag and cough present. Breathing  over the vent.  Facial symmetry not able to test due to ET tube.  Tongue protrusion not cooperative. On pain stimulation, localized to pain at RUE and mild withdraw of RLE, no movement of left UE and LE. Sensation, coordination not cooperative and gait not tested.  Etiology for pt ICH and IVH likely due to hypertension given location in the setting of coumadin use. On 3% saline now with Na goal 150-155. Currently in NSR on home flecainide. Off cleviprex and was once on levophed for low BP, now off. Propofol and fentanyl for sedation. Had fever and likely aspiration, s/p azithromycin and rocephin once. CCM on board, may consider further Abx if continued fever. On TF.   For detailed assessment and plan, please refer to above as I have made changes wherever appropriate.   Rosalin Hawking, MD PhD Stroke Neurology 09/21/2021 10:26 PM  This patient is critically ill due to right thalamic ICH, IVH, midline shift, cerebral edema, PAF, respiratory failure and at significant risk of neurological worsening, death form hematoma expansion, obstructive hydrocephalus, brain herniation, seizure, heart failure, renal failure. This patient's care requires constant monitoring of vital signs, hemodynamics, respiratory and cardiac monitoring, review of multiple databases, neurological assessment, discussion with family, other specialists and medical decision making of high complexity. I spent 40 minutes of neurocritical care time  in the care of this patient.    To contact Stroke Continuity provider, please refer to [email or web address] After hours, contact General Neurology

## 2021-09-21 NOTE — Progress Notes (Signed)
Initial Nutrition Assessment  DOCUMENTATION CODES:   Obesity unspecified  INTERVENTION:   Initiate tube feeding via Cortrak tube: Vital AF 1.2 at 45 ml/h (1080 ml per day) Prosource TF 90 ml BID  Provides 1456 kcal, 125 gm protein, 875 ml free water daily  TF regimen and propofol at current rate providing 1747 total kcal/day    NUTRITION DIAGNOSIS:   Inadequate oral intake related to inability to eat as evidenced by NPO status.  GOAL:   Patient will meet greater than or equal to 90% of their needs  MONITOR:   Vent status, TF tolerance  REASON FOR ASSESSMENT:   Consult, Ventilator Enteral/tube feeding initiation and management  ASSESSMENT:   Pt with PMH of Afib on coumadin, HTN, HLD, previous smoker, previous CVA, DM, OSA now admitted with R thalamic bleed with intraventricular extension.   Pt discussed during ICU rounds and with RN. Cleviprex and pressor weaned off.  Pt to remain on vent due to cerebral edema and possible further decline. Per MD pt may require trach due to tracheal stenosis.    1/23 s/p cortrak placement; per xray tip in pyloric bulb   Patient is currently intubated on ventilator support MV: 7.8 L/min Temp (24hrs), Avg:99.8 F (37.7 C), Min:98.6 F (37 C), Max:101.3 F (38.5 C)  Propofol: 11 ml/hr provides: 290 kcal  Medications reviewed and include: SSI, novolog, protonix, KCl x 1, senokot-s Fentanyl Hypertonic saline @ 75 ml/hr   Labs reviewed: Na 150, K: 3.3, TG: 242 CBG's: 162-206   UOP: 2200 ml   NUTRITION - FOCUSED PHYSICAL EXAM:  Flowsheet Row Most Recent Value  Orbital Region No depletion  Upper Arm Region No depletion  Thoracic and Lumbar Region No depletion  Buccal Region Unable to assess  Temple Region No depletion  Clavicle Bone Region No depletion  Clavicle and Acromion Bone Region No depletion  Scapular Bone Region No depletion  Dorsal Hand No depletion  Patellar Region No depletion  Anterior Thigh Region No  depletion  Posterior Calf Region No depletion  Edema (RD Assessment) None  Hair Reviewed  Eyes Unable to assess  Mouth Unable to assess  Skin Reviewed  Nails Reviewed       Diet Order:   Diet Order             Diet NPO time specified  Diet effective now                   EDUCATION NEEDS:   No education needs have been identified at this time  Skin:  Skin Assessment: Reviewed RN Assessment  Last BM:  unknown  Height:   Ht Readings from Last 1 Encounters:  09/20/21 5\' 4"  (1.626 m)    Weight:   Wt Readings from Last 1 Encounters:  09/20/21 93 kg    BMI:  Body mass index is 35.19 kg/m.  Estimated Nutritional Needs:   Kcal:  1300-1600  Protein:  118-130 grams  Fluid:  > 1.5 L/day  Lockie Pares., RD, LDN, CNSC See AMiON for contact information

## 2021-09-21 NOTE — Progress Notes (Signed)
Carotid duplex bilateral study completed.   Please see CV Proc for preliminary results.   Kodi Guerrera, RDMS, RVT  

## 2021-09-21 NOTE — Progress Notes (Signed)
SLP Cancellation Note  Patient Details Name: Caleb Vaughan MRN: [id number] DOB: 02-Dec-1960   Cancelled treatment:       Reason Eval/Treat Not Completed: Patient not medically ready   Ellington Greenslade, Riley Nearing 09/21/2021, 7:54 AM

## 2021-09-21 NOTE — Progress Notes (Signed)
OT Cancellation Note  Patient Details Name: Caleb Vaughan MRN: [id number] DOB: Dec 10, 1960   Cancelled Treatment:    Reason Eval/Treat Not Completed: Active bedrest order (Will assess when activity orders updated. Thanks)  Burnett Corrente Jordane Hisle, OT/L   Acute OT Clinical Specialist Acute Rehabilitation Services Pager [phone] Office [phone]  09/21/2021, 7:38 AM

## 2021-09-21 NOTE — Progress Notes (Signed)
Pt placed back on full vent support due to apnea. Pt tolerating well, RN aware, RT will continue to monitor.

## 2021-09-21 NOTE — Procedures (Signed)
Cortrak  Person Inserting Tube:  Joie Reamer, RD Tube Type:  Cortrak - 43 inches Tube Size:  10 Tube Location:  Left nare Initial Placement:  Stomach Secured by: Bridle Technique Used to Measure Tube Placement:  Marking at nare/corner of mouth Cortrak Secured At:  64 cm   Cortrak Tube Team Note:  Consult received to place a Cortrak feeding tube.   X-ray is required, abdominal x-ray has been ordered by the Cortrak team. Please confirm tube placement before using the Cortrak tube.   If the tube becomes dislodged please keep the tube and contact the Cortrak team at [email or web address] (password [email or web address]) for replacement.  If after hours and replacement cannot be delayed, place a NG tube and confirm placement with an abdominal x-ray.     Soul Deveney MS, RDN, LDN, CNSC Registered Dietitian III Clinical Nutrition RD Pager and On-Call Pager Number Located in Amion   

## 2021-09-21 NOTE — Progress Notes (Signed)
PT Cancellation Note  Patient Details Name: Caleb Vaughan MRN: [id number] DOB: March 29, 1961   Cancelled Treatment:    Reason Eval/Treat Not Completed: Active bedrest order. Will assess when activity orders advanced.   Lyanne Co, PT  Acute Rehab Services  Pager [phone] Office [phone]    Lawana Chambers Adine Heimann 09/21/2021, 1:08 PM

## 2021-09-21 NOTE — Progress Notes (Signed)
°  Echocardiogram 2D Echocardiogram has been performed.  Delcie Roch 09/21/2021, 11:02 AM

## 2021-09-21 NOTE — Consult Note (Signed)
NAME:  Caleb Vaughan, MRN:  [id number], DOB:  Mar 18, 1961, LOS: 1 ADMISSION DATE:  09/20/2021, CONSULTATION DATE:  09/20/2021 REFERRING MD:  Dr. Carren Rang, CHIEF COMPLAINT:  Hemorrhagic stroke    History of Present Illness:  Caleb Vaughan is a 61 y.o. male with PMH listed below  who presented to the emergency department 1/22 as a code stroke, last known normal 2 AM.   Stroke CT on admit revealed, positive for acute right thalamic hemorrhage with intraventricular extension with estimated intra-axial blood volume 25 mL. Mild Regional brain edema. Trace leftward midline shift. Patient was intubated in ED and PCCM was consulted for further assessment   Pertinent  Medical History  A-fin on coumadin  HTN HLD Previous smoker  Prior CVA  Diabetes  OSA   Significant Hospital Events: Including procedures, antibiotic start and stop dates in addition to other pertinent events   1/22 Admitted as code stroke found to have right thalamic bleed with trace leftward shift. Intubated in ED for increasing somnolence and inability to protect airway.   Interim History / Subjective:  No overnight events.  Cleviprex and norepinephrine have been weaned off. Remains on fentanyl and propofol to maintain ventilator synchrony.   Objective   Blood pressure (!) 142/63, pulse 64, temperature 99 F (37.2 C), resp. rate 16, height 5\' 4"  (1.626 m), weight 93 kg, SpO2 96 %.    Vent Mode: PRVC FiO2 (%):  [40 %-100 %] 40 % Set Rate:  [16 bmp] 16 bmp Vt Set:  [470 mL-560 mL] 470 mL PEEP:  [5 cmH20-8 cmH20] 8 cmH20 Plateau Pressure:  [18 cmH20-20 cmH20] 19 cmH20   Intake/Output Summary (Last 24 hours) at 09/21/2021 [id number] Last data filed at 09/21/2021 0600 Gross per 24 hour  Intake 2176.2 ml  Output 2220 ml  Net -43.8 ml   Filed Weights   09/20/21 0900  Weight: 93 kg    Examination: General: Acute on chronically ill appearing middle aged male on mechanical ventilation, in NAD HEENT: ETT, MM pink/moist,  PERRL,  Neuro: with sedation paused. Will open eyes to voice and move right side to command. Continue to have flaccid hemiplegia on left.  CV: s1s2 regular rate and rhythm, no murmur, rubs, or gallops,  PULM:  chest clear with no dyssynchrony.  GI: soft, bowel sounds active in all 4 quadrants, non-tender, non-distended Extremities: warm/dry, no edema  Skin: no rashes or lesions  Ancillary tests personally reviewed.   CT head shows right thalamic bleed with intraventricular extension. MRI shows no evidence of AVM or other source of bleeding.  Hypernatremia at 150 Creatinine 1.66  Assessment & Plan:   Principal Problem:   Nontraumatic thalamic hemorrhage (HCC) Active Problems:   Respiratory failure with hypoxia (HCC)   Aspiration pneumonitis (HCC)   Paroxysmal A-fib (HCC)   Obstructive sleep apnea   Type 2 diabetes mellitus ([employer])   Tracheal stenosis   Acute hypernatremia   Acute kidney injury ([place])  Plan:   - continue full lung protective ventilatory support through first 48h as may decline further due to cerebral edema.  - extubation will likely be complicated by tracheal stenosis. May require tracheostomy. - on  3% saline for cerebral edema control, push to 155 if shows signs of worsening neurological examination.  - off anticoagulation for at least 1 month. Long-term anticoagulation plan will depend on degree of neurological recovery.   - Currently in sinus rhythm, continue flecainide.  - maintain SBP 110-140, if possible and stop propofol if possible, keep RASS -  1, holding home antihypertensives for now  - add tube feed coverage insulin - follow renal function for now.   Best Practice (right click and "Reselect all SmartList Selections" daily)   Diet/type: tubefeeds DVT prophylaxis: SCD - initiate chemical prophylaxis if follow-up scan unchanged .  GI prophylaxis: PPI Lines: N/A Foley:  N/A Code Status:  full code Last date of multidisciplinary goals of care  discussion: Son updated at bedside am of 1/22   Performed by: Kipp Brood  Total critical care time: 40 minutes  Critical care time was exclusive of separately billable procedures and treating other patients.  Critical care was necessary to treat or prevent imminent or life-threatening deterioration.  Critical care was time spent personally by me on the following activities: development of treatment plan with patient and/or surrogate as well as nursing, discussions with consultants, evaluation of patient's response to treatment, examination of patient, obtaining history from patient or surrogate, ordering and performing treatments and interventions, ordering and review of laboratory studies, ordering and review of radiographic studies, pulse oximetry and re-evaluation of patient's condition.  Kipp Brood, MD [facility] ICU Physician [place]  Pager: [phone] Or Epic Secure Chat After hours: [phone].  09/21/2021, 8:59 AM     09/21/2021, 8:07 AM

## 2021-09-21 NOTE — TOC CAGE-AID Note (Signed)
Transition of Care [facility]) - CAGE-AID Screening   Patient Details  Name: Caleb Vaughan MRN: [id number] Date of Birth: 1961-02-23  Transition of Care [facility]) CM/SW Contact:    Damarkus Balis C Tarpley-Carter, LCSWA Phone Number: 09/21/2021, 3:24 PM   Clinical Narrative: Pt is unable to participate in Cage Aid.  Zissy Hamlett Tarpley-Carter, MSW, LCSW-A Pronouns:  She/Her/Hers [place] Transitions of Care Clinical Social Worker Direct Number:  [phone] [email or web address]    CAGE-AID Screening: Substance Abuse Screening unable to be completed due to: : Patient unable to participate             Substance Abuse Education Offered: No

## 2021-09-21 NOTE — Progress Notes (Incomplete)
STUDENT FOLLOW-UP NOTES - DISREGARD                          CC/HPI:  Caleb Vaughan presented to ED via EMS with left-sided weakness, dysarthria, and right fixed gaze. Imaging showed intracerebral hemorrhage with intraventricular extension. During CT scan, patient had gurgling respirations; concern for aspiration event.   PMH: Afib on Warfarin, s/p recent conversion, HTN, HLD, tobacco use, cataracts, glaucoma, obesity, prior CVA, DM, OSA  weekend A: -extubated 1/29, reintubated in PM, possible aspiration event -Na 146 >> 147 >> 142 >> 144 -SBPs still > 160 x24h -CBGs above goal, 27u rSSI given yesterday, 30u TF insulin + basal 44u   PLAN:  -f/u free water, sodium level -planning for trach  -trend WBC, fever curve d/t potential aspiration -increase hydralazine 50mg  q8h   -can restart losartan, will help with K (takes PTA, SCr 1.12) -increase bethanecol 25mg  TID -replete K 40 mEq per tube -increase basal to 27u   -f/u decreasing SSI -start fiber     Anticoag: Plts 213, Hgb 13.6, SCDs 1/24 started Heparin TID, CBC normal 1/25 Plts 227, Hgb 13.4 1/26 Plts 238, Hgb 13.3 1/27 Plts 234, Hgb 13.2 1/29 Plts 228, Hgb 12.5 1/30 Plts 220, Hgb 11.9  ID: WBC 12.7, Tmax 102.7 on 1/23 d/t possible thalamic bleed, currently afebrile 1/26 WBC 11.1, afebrile 1/27 WBC 10.5, Tmax 100.5 1/29 WBC 11.7 1/30 WBC 16.3, Tmax 102.4  Antimicrobials this admission: Azithromycin in D5 500mg  QD 1/22 >> 1/22 Ceftriaxone 2g q24h 1/22 >> 1/23 (No renal adjustment)  Microbiology results: 1/22 Resp. Panel: negative 1/22 MRSA PCR: negative 1/24 BCx: aerobic, G+ cocci in clusters 2/4 (staph epidermidis single set) 1/24 TA: NGTD  CV: CHADSVASC 2 (HTN, DM), BP goal 120-140, LDL 52, TG 263 PICC line placed 1/22 Off Clevi+NE, Labetalol 20mg ; flecainide continued 1/26 off cleviprex, started home coreg 1/30 SBPs > 140   Endocrine: A1c 9.7 h/x of DM on 25-35 unit levemir PTA; currently  on mSSI; CBGs 339 >> 194 >> 160 >> 160 1/26 inc basal 10u BID >> 20u BID, CBGs above goal 1/27 as above 1/30 CBGs above goal, 27u rSSI given yesterday, 30u TF insulin + basal 44u  GI/Nutrition: NPO; No LBM documented; Protonix IV, Senna-S BID 1/27 NPO, flexiseal, small-bore feeding tube 1/30 LBM 1/29, still loose, added fiber  Neuro: CPOT 0, RASS -3, Fent 2/30, Prop 20 1/26 CPOT 0, RASS -3, Fent 2/30, Prop 10 1/27 CPOT 0, RASS -1, Fent 100, Prop off 1/30 CPOT 0, RASS -1, Fent PRN    Renal: SCr 1.66, contrast given 1/23 0545; low UOP; Na goal 150 >> 155 if neurological worsening Anion Gap 2.7, Foley d/c 1/24 1/24 SCr 1.66 >> 1.35 1/25 SCr 1.35 >> 1.2, bladder scan 380 >> 618 >> 775 1/26 SCr 1.09, foley catheter, bethanechol  1/27 SCr 1.11 1/29 Bladder scan 325 1/30 SCr 1.12  Pulm: intubated 1/22; FiO2 40; may need trach d/t stenosis 1/26 trach possible in next 2-3 days, overall improvement  1/30 FiO2 40, bucking the vent  Heme/Onc:  Kcentra 2186 units given 1/22 0945 Vit K 10mg  IV given 1/22 0957 INR 1.2 >> 1.3; PT 15.8 seconds  PTA Med Issues: Atorvastatin 80mg  QD, Jardiance 25mg  QD, Levemir 35 units, Hyzaar 100-25mg  QD, Metformin 1000mg  BID, Warfarin 10-15mg  Best Practices:   PTA Warfarin dosing: 15mg  Th, Sat; 10mg  all other days    Antiarrhythmic: K, Mg, Renal function, QTc   8  mmol per Phos packet Zosyn (pseudo + anaerobic)

## 2021-09-22 ENCOUNTER — Inpatient Hospital Stay (HOSPITAL_COMMUNITY): Payer: No Typology Code available for payment source

## 2021-09-22 DIAGNOSIS — I619 Nontraumatic intracerebral hemorrhage, unspecified: Secondary | ICD-10-CM | POA: Diagnosis not present

## 2021-09-22 DIAGNOSIS — Z7901 Long term (current) use of anticoagulants: Secondary | ICD-10-CM | POA: Diagnosis not present

## 2021-09-22 DIAGNOSIS — I615 Nontraumatic intracerebral hemorrhage, intraventricular: Secondary | ICD-10-CM | POA: Diagnosis not present

## 2021-09-22 DIAGNOSIS — N179 Acute kidney failure, unspecified: Secondary | ICD-10-CM | POA: Diagnosis not present

## 2021-09-22 DIAGNOSIS — I4821 Permanent atrial fibrillation: Secondary | ICD-10-CM | POA: Diagnosis not present

## 2021-09-22 DIAGNOSIS — J398 Other specified diseases of upper respiratory tract: Secondary | ICD-10-CM

## 2021-09-22 LAB — URINALYSIS, ROUTINE W REFLEX MICROSCOPIC
Bacteria, UA: NONE SEEN
Bilirubin Urine: NEGATIVE
Glucose, UA: >=500 mg/dL — AB
Hgb urine dipstick: NEGATIVE
Ketones, ur: NEGATIVE mg/dL
Leukocytes,Ua: NEGATIVE
Nitrite: NEGATIVE
Protein, ur: 100 mg/dL — AB
Specific Gravity, Urine: 1.028 (ref 1.005–1.030)
pH: 5 (ref 5.0–8.0)

## 2021-09-22 LAB — CBC
HCT: 42.1 % (ref 39.0–52.0)
Hemoglobin: 13.6 g/dL (ref 13.0–17.0)
MCH: 30.7 pg (ref 26.0–34.0)
MCHC: 32.3 g/dL (ref 30.0–36.0)
MCV: 95 fL (ref 80.0–100.0)
Platelets: 213 10*3/uL (ref 150–400)
RBC: 4.43 MIL/uL (ref 4.22–5.81)
RDW: 14.6 % (ref 11.5–15.5)
WBC: 13.5 10*3/uL — ABNORMAL HIGH (ref 4.0–10.5)
nRBC: 0 % (ref 0.0–0.2)

## 2021-09-22 LAB — HEMOGLOBIN A1C
Hgb A1c MFr Bld: 9.7 % — ABNORMAL HIGH (ref 4.8–5.6)
Mean Plasma Glucose: 232 mg/dL

## 2021-09-22 LAB — BASIC METABOLIC PANEL
Anion gap: 9 (ref 5–15)
BUN: 23 mg/dL — ABNORMAL HIGH (ref 6–20)
CO2: 23 mmol/L (ref 22–32)
Calcium: 9 mg/dL (ref 8.9–10.3)
Chloride: 123 mmol/L — ABNORMAL HIGH (ref 98–111)
Creatinine, Ser: 1.35 mg/dL — ABNORMAL HIGH (ref 0.61–1.24)
GFR, Estimated: >60 mL/min (ref >60)
Glucose, Bld: 218 mg/dL — ABNORMAL HIGH (ref 70–99)
Potassium: 3.9 mmol/L (ref 3.5–5.1)
Sodium: 155 mmol/L — ABNORMAL HIGH (ref 135–145)

## 2021-09-22 LAB — GLUCOSE, CAPILLARY
Glucose-Capillary: 133 mg/dL — ABNORMAL HIGH (ref 70–99)
Glucose-Capillary: 230 mg/dL — ABNORMAL HIGH (ref 70–99)
Glucose-Capillary: 307 mg/dL — ABNORMAL HIGH (ref 70–99)
Glucose-Capillary: 187 mg/dL — ABNORMAL HIGH (ref 70–99)
Glucose-Capillary: 152 mg/dL — ABNORMAL HIGH (ref 70–99)
Glucose-Capillary: 150 mg/dL — ABNORMAL HIGH (ref 70–99)

## 2021-09-22 LAB — SODIUM
Sodium: 156 mmol/L — ABNORMAL HIGH (ref 135–145)
Sodium: 155 mmol/L — ABNORMAL HIGH (ref 135–145)
Sodium: 154 mmol/L — ABNORMAL HIGH (ref 135–145)

## 2021-09-22 LAB — PROTIME-INR
INR: 1.3 — ABNORMAL HIGH (ref 0.8–1.2)
Prothrombin Time: 15.8 seconds — ABNORMAL HIGH (ref 11.4–15.2)

## 2021-09-22 LAB — PHOSPHORUS
Phosphorus: 3.8 mg/dL (ref 2.5–4.6)
Phosphorus: 2.3 mg/dL — ABNORMAL LOW (ref 2.5–4.6)

## 2021-09-22 LAB — MAGNESIUM
Magnesium: 2.6 mg/dL — ABNORMAL HIGH (ref 1.7–2.4)
Magnesium: 2.8 mg/dL — ABNORMAL HIGH (ref 1.7–2.4)

## 2021-09-22 MED ORDER — ARTIFICIAL TEARS OPHTHALMIC OINT
1.0000 "application " | TOPICAL_OINTMENT | Freq: Three times a day (TID) | OPHTHALMIC | Status: DC
  Administered 2021-09-22 (×3): 1 via OPHTHALMIC
  Filled 2021-09-22: qty 3.5

## 2021-09-22 MED ORDER — VECURONIUM BROMIDE 10 MG IV SOLR
0.1000 mg/kg | INTRAVENOUS | Status: DC | PRN
  Administered 2021-09-22: 09:00:00 9.3 mg via INTRAVENOUS
  Filled 2021-09-22: qty 10

## 2021-09-22 MED ORDER — MIDAZOLAM HCL 2 MG/2ML IJ SOLN
2.0000 mg | INTRAMUSCULAR | Status: AC | PRN
  Administered 2021-09-22 – 2021-09-26 (×5): 2 mg via INTRAVENOUS
  Filled 2021-09-22 (×5): qty 2

## 2021-09-22 MED ORDER — AMLODIPINE BESYLATE 10 MG PO TABS
10.0000 mg | ORAL_TABLET | Freq: Every day | ORAL | Status: DC
  Administered 2021-09-23: 10:00:00 10 mg
  Filled 2021-09-22 (×2): qty 1

## 2021-09-22 MED ORDER — CISATRACURIUM BESYLATE 20 MG/10ML IV SOLN
0.1000 mg/kg | Freq: Once | INTRAVENOUS | Status: AC
  Administered 2021-09-22: 07:00:00 9.4 mg via INTRAVENOUS
  Filled 2021-09-22: qty 10

## 2021-09-22 MED ORDER — ACETAMINOPHEN 160 MG/5ML PO SOLN
650.0000 mg | Freq: Four times a day (QID) | ORAL | Status: DC
  Administered 2021-09-22 – 2021-09-25 (×13): 650 mg
  Filled 2021-09-22 (×13): qty 20.3

## 2021-09-22 MED ORDER — FUROSEMIDE 10 MG/ML IJ SOLN
40.0000 mg | Freq: Once | INTRAMUSCULAR | Status: AC
  Administered 2021-09-22: 09:00:00 40 mg via INTRAVENOUS
  Filled 2021-09-22: qty 4

## 2021-09-22 MED ORDER — IPRATROPIUM-ALBUTEROL 0.5-2.5 (3) MG/3ML IN SOLN
3.0000 mL | Freq: Four times a day (QID) | RESPIRATORY_TRACT | Status: DC
  Administered 2021-09-22 – 2021-09-24 (×10): 3 mL via RESPIRATORY_TRACT
  Filled 2021-09-22 (×10): qty 3

## 2021-09-22 MED ORDER — POTASSIUM CHLORIDE 20 MEQ PO PACK
20.0000 meq | PACK | Freq: Once | ORAL | Status: AC
  Administered 2021-09-22: 08:00:00 20 meq
  Filled 2021-09-22: qty 1

## 2021-09-22 NOTE — Consult Note (Signed)
NAME:  Caleb Vaughan, MRN:  [id number], DOB:  26-Jun-1961, LOS: 2 ADMISSION DATE:  09/20/2021, CONSULTATION DATE:  09/20/2021 REFERRING MD:  Dr. Oretha Milch, CHIEF COMPLAINT:  Hemorrhagic stroke    History of Present Illness:  Caleb Vaughan is a 61 y.o. male with PMH listed below  who presented to the emergency department 1/22 as a code stroke, last known normal 2 AM.   Stroke CT on admit revealed, positive for acute right thalamic hemorrhage with intraventricular extension with estimated intra-axial blood volume 25 mL. Mild Regional brain edema. Trace leftward midline shift. Patient was intubated in ED and PCCM was consulted for further assessment   Pertinent  Medical History  A-fin on coumadin  HTN HLD Previous smoker  Prior CVA  Diabetes  OSA   Significant Hospital Events: Including procedures, antibiotic start and stop dates in addition to other pertinent events   1/22 Admitted as code stroke found to have right thalamic bleed with trace leftward shift. Intubated in ED for increasing somnolence and inability to protect airway.   Interim History / Subjective:  Tolerated PSV for most of day yesterday. Developed ventilator dyssynchrony overnight and fever and was given a dose of paralytic.  Febrile overnight.   Objective   Blood pressure (!) 154/66, pulse (!) 101, temperature 99.8 F (37.7 C), resp. rate 16, height 5\' 4"  (1.626 m), weight 93 kg, SpO2 97 %.    Vent Mode: PRVC FiO2 (%):  [40 %-50 %] 40 % Set Rate:  [16 bmp] 16 bmp Vt Set:  [470 mL] 470 mL PEEP:  [5 cmH20] 5 cmH20 Pressure Support:  [12 cmH20] 12 cmH20 Plateau Pressure:  [16 cmH20-19 cmH20] 17 cmH20   Intake/Output Summary (Last 24 hours) at 09/22/2021 0740 Last data filed at 09/22/2021 0700 Gross per 24 hour  Intake 2937.73 ml  Output 1970 ml  Net 967.73 ml    Filed Weights   09/20/21 0900  Weight: 93 kg    Examination: General: Acute on chronically ill appearing middle aged male on mechanical  ventilation, in NAD HEENT: ETT, MM pink/moist, PERRL,  Neuro: no response to stimulation.  CV: s1s2 regular rate and rhythm, no murmur, rubs, or gallops,  PULM:  chest clear, occasional double stacking.  GI: soft, bowel sounds active in all 4 quadrants, non-tender, non-distended Extremities: warm/dry, no edema  Skin: no rashes or lesions  Ancillary tests personally reviewed.   CT head shows right thalamic bleed with intraventricular extension. MRI shows no evidence of AVM or other source of bleeding.  Hypernatremia at 155 Creatinine 1.35 - improving.  Assessment & Plan:   Principal Problem:   Nontraumatic thalamic hemorrhage (HCC) Active Problems:   Respiratory failure with hypoxia (HCC)   Aspiration pneumonitis (HCC)   Paroxysmal A-fib (HCC)   Obstructive sleep apnea   Type 2 diabetes mellitus (HCC)   Tracheal stenosis   Acute hypernatremia   Acute kidney injury (HCC)  Plan:   - continue full lung protective ventilatory support through first 48h as may decline further due to cerebral edema.  - extubation will likely be complicated by tracheal stenosis. May require tracheostomy. - stopped 3% saline - allow to drift down, now at 48 h mark post event. If remains stable/improves consider trial of extubation at 4-5 day mark. Otherwise proceed with tracheostomy. - Cultures results pending. Patient may have central fever from thalamic bleed - Wean sedation to obtain examination. keep RASS -1,  - off anticoagulation for at least 1 month. Long-term anticoagulation plan will depend  on degree of neurological recovery.   - Currently in sinus rhythm, continue flecainide.  - maintain SBP 110-160, if , holding home antihypertensives for now  - add tube feed coverage insulin - follow renal function for now.   Best Practice (right click and "Reselect all SmartList Selections" daily)   Diet/type: tubefeeds DVT prophylaxis: SCD - initiate chemical prophylaxis if follow-up scan unchanged .   GI prophylaxis: PPI Lines: N/A Foley:  N/A Code Status:  full code Last date of multidisciplinary goals of care discussion: Son updated at bedside am of 1/22   Performed by: Lynnell Catalan  Total critical care time: 40 minutes  Critical care time was exclusive of separately billable procedures and treating other patients.  Critical care was necessary to treat or prevent imminent or life-threatening deterioration.  Critical care was time spent personally by me on the following activities: development of treatment plan with patient and/or surrogate as well as nursing, discussions with consultants, evaluation of patient's response to treatment, examination of patient, obtaining history from patient or surrogate, ordering and performing treatments and interventions, ordering and review of laboratory studies, ordering and review of radiographic studies, pulse oximetry and re-evaluation of patient's condition.  Lynnell Catalan, MD [facility] ICU Physician [facility]  Pager: [phone] Or Epic Secure Chat After hours: [phone].  09/22/2021, 7:40 AM     09/22/2021, 7:40 AM

## 2021-09-22 NOTE — TOC Initial Note (Signed)
Transition of Care [facility]) - Initial/Assessment Note    Patient Details  Name: Caleb Vaughan MRN: [id number] Date of Birth: [birth year]  Transition [facility]) CM/SW Contact:    Lawerance Sabal, RN Phone Number: 09/22/2021, 1:05 PM  Clinical Narrative:        Patient admitted with stoke, from home with son. Currently intubated, sedated, paralyzed for ventilation.  Supportive family, mom, brother, son, cousin.        TOC will continue to follow.       Expected Discharge Plan:  (TBD)     Patient Goals and CMS Choice        Expected Discharge Plan and Services Expected Discharge Plan:  (TBD)   Discharge Planning Services: CM Consult   Living arrangements for the past 2 months: Single Family Home                                      Prior Living Arrangements/Services Living arrangements for the past 2 months: Single Family Home Lives with:: Self                   Activities of Daily Living Home Assistive Devices/Equipment: None ADL Screening (condition at time of admission) Patient's cognitive ability adequate to safely complete daily activities?: No Is the patient deaf or have difficulty hearing?: No Does the patient have difficulty seeing, even when wearing glasses/contacts?: No Does the patient have difficulty concentrating, remembering, or making decisions?: Yes Patient able to express need for assistance with ADLs?: No Does the patient have difficulty dressing or bathing?: Yes Independently performs ADLs?: No Communication:  (intubated) Dressing (OT): Dependent Is this a change from baseline?: Change from baseline, expected to last >3 days Grooming: Dependent Is this a change from baseline?: Change from baseline, expected to last >3 days Feeding: Dependent Is this a change from baseline?: Change from baseline, expected to last >3 days Bathing: Dependent Is this a change from baseline?: Change from baseline, expected to last >3 days Toileting:  Dependent Is this a change from baseline?: Change from baseline, expected to last >3days In/Out Bed: Dependent Is this a change from baseline?: Change from baseline, expected to last >3 days Walks in Home: Independent Does the patient have difficulty walking or climbing stairs?: No Weakness of Legs: Left (previous CVA) Weakness of Arms/Hands: Left (previous CVA)  Permission Sought/Granted                  Emotional Assessment              Admission diagnosis:  ICH (intracerebral hemorrhage) (HCC) [I61.9] Right-sided nontraumatic intracerebral hemorrhage, unspecified cerebral location [facility]) [I61.9] Patient Active Problem List   Diagnosis Date Noted   Respiratory failure with hypoxia (HCC) 09/21/2021   Aspiration pneumonitis (HCC) 09/21/2021   Paroxysmal A-fib (HCC) 09/21/2021   Obstructive sleep apnea 09/21/2021   Type 2 diabetes mellitus (HCC) 09/21/2021   Tracheal stenosis 09/21/2021   Acute hypernatremia 09/21/2021   Acute kidney injury (HCC) 09/21/2021   Nontraumatic thalamic hemorrhage (HCC) 09/20/2021   PCP:  Pcp, No Pharmacy:   [id number] - [place], [place] - [address] AT [address]OSS [address] [place] [address] Phone: [phone] Fax: [phone]     Social Determinants of Health (SDOH) Interventions    Readmission Risk Interventions No flowsheet data found.

## 2021-09-22 NOTE — Progress Notes (Signed)
Pt not tolerating SBT at this time due to pt desat, apnea, tachycardia. Pt placed back on full vent support, pt tolerating well at this time. MD aware, RN at bedside, RT will continue to monitor.

## 2021-09-22 NOTE — Progress Notes (Signed)
SLP Cancellation Note  Patient Details Name: Caleb Vaughan MRN: [id number] DOB: December 23, 1960   Cancelled treatment:        Remains intubated. Will follow.   Royce Macadamia 09/22/2021, 11:19 AM  Breck Coons Lonell Face.Ed Nurse, children's [phone] Office [phone]

## 2021-09-22 NOTE — Progress Notes (Signed)
OT Cancellation Note  Patient Details Name: Noe Goyer MRN: [id number] DOB: 22-Jan-1961   Cancelled Treatment:    Reason Eval/Treat Not Completed: Patient not medically ready  Burnett Corrente Huxley Vanwagoner, OT/L   Acute OT Clinical Specialist Acute Rehabilitation Services Pager [phone] Office [phone]  09/22/2021, 9:54 AM

## 2021-09-22 NOTE — Progress Notes (Signed)
Inpatient Diabetes Program Recommendations  AACE/ADA: New Consensus Statement on Inpatient Glycemic Control (2015)  Target Ranges:  Prepandial:   less than 140 mg/dL      Peak postprandial:   less than 180 mg/dL (1-2 hours)      Critically ill patients:  140 - 180 mg/dL   Lab Results  Component Value Date   GLUCAP 307 (H) 09/22/2021   HGBA1C 9.7 (H) 09/21/2021    Review of Glycemic Control  Diabetes history: DM2 Outpatient Diabetes medications: Levemir 35 units BID, metformin 1000 mg BID, Jardiance 25 mg QD Current orders for Inpatient glycemic control: Novolog 0-15 Q4H + 5 units Q4H.  HgbA1C - 9.7%  Inpatient Diabetes Program Recommendations:    Consider Levemir 10 units BID. Titrate up if FBS > 180 mg/dL.  Continue to follow.  Thank you. Lorenda Peck, RD, LDN, CDE Inpatient Diabetes Coordinator [phone]

## 2021-09-22 NOTE — Progress Notes (Signed)
eLink Physician-Brief Progress Note Patient Name: Caleb Vaughan DOB: [birth year] MRN: [id number]   Date of Service  09/22/2021  HPI/Events of Note  Patient with temperature spike up to 102.7 despite Tylenol and PRN ice packs, no obvious infectious focus but testing to r/o infection is indicated, other possible etiology is  intracranial hemorrhage.  eICU Interventions  Cooling blanket ordered, blood, respiratory secretions cultures ordered, CXR  this AM to r/o aspiration, urinalysis .        Thomasene Lot Talani Brazee 09/22/2021, 12:42 AM

## 2021-09-22 NOTE — Progress Notes (Addendum)
STROKE TEAM PROGRESS NOTE   INTERVAL HISTORY No family is at the bedside. Patient became dyssynchronous with the ventilator overnight and has received paralytics. He was also febrile and is now on a cooling blanket. He received azithromycin and rocehpin. Waiting culture results.  Most recent Na 155 and hypertonic saline was discontinued.   Vitals:   09/22/21 0852 09/22/21 0900 09/22/21 1100 09/22/21 1200  BP:  123/65 139/78 (!) 104/52  Pulse:  92 81 69  Resp:  16 (!) 22 16  Temp:   (!) 97.3 F (36.3 C) (!) 97.5 F (36.4 C)  TempSrc:      SpO2: 93% 93% 97% 95%  Weight:      Height:       CBC:  Recent Labs  Lab 09/20/21 0908 09/20/21 0913 09/21/21 0554 09/22/21 0523  WBC 9.1  --  14.5* 13.5*  NEUTROABS 6.6  --   --   --   HGB 16.2   < > 12.5* 13.6  HCT 46.1   < > 37.5* 42.1  MCV 86.7  --  90.6 95.0  PLT 248  --  222 213   < > = values in this interval not displayed.    Basic Metabolic Panel:  Recent Labs  Lab 09/21/21 0554 09/21/21 1300 09/21/21 1806 09/21/21 1933 09/22/21 0523 09/22/21 1114  NA 150*   < >  --    < > 155* 155*  K 3.3*  --   --   --  3.9  --   CL 116*  --   --   --  123*  --   CO2 27  --   --   --  23  --   GLUCOSE 158*  --   --   --  218*  --   BUN 27*  --   --   --  23*  --   CREATININE 1.66*  --   --   --  1.35*  --   CALCIUM 8.6*  --   --   --  9.0  --   MG 2.3  --  2.6*  --  2.6*  --   PHOS 3.5  --  1.6*  --  3.8  --    < > = values in this interval not displayed.    Lipid Panel:  Recent Labs  Lab 09/21/21 0554  CHOL 129  TRIG 242*   263*  HDL 29*  CHOLHDL 4.4  VLDL 48*  LDLCALC 52    HgbA1c:  Recent Labs  Lab 09/21/21 0554  HGBA1C 9.7*   Urine Drug Screen:  Recent Labs  Lab 09/21/21 0303  LABOPIA NONE DETECTED  COCAINSCRNUR NONE DETECTED  LABBENZ NONE DETECTED  AMPHETMU NONE DETECTED  THCU NONE DETECTED  LABBARB NONE DETECTED     Alcohol Level No results for input(s): ETH in the last 168 hours.  IMAGING past  24 hours CT HEAD WO CONTRAST (5MM)  Result Date: 09/22/2021 CLINICAL DATA:  Follow-up intracranial hemorrhage. EXAM: CT HEAD WITHOUT CONTRAST TECHNIQUE: Contiguous axial images were obtained from the base of the skull through the vertex without intravenous contrast. RADIATION DOSE REDUCTION: This exam was performed according to the departmental dose-optimization program which includes automated exposure control, adjustment of the mA and/or kV according to patient size and/or use of iterative reconstruction technique. COMPARISON:  Earlier CT dated 09/20/2021. FINDINGS: Brain: No significant interval change in the right thalamic hemorrhage with extension into the ventricular system since the prior CT.  No significant midline shift. The ventricles and sulci appropriate size for patient's age. Mild periventricular and deep white matter chronic microvascular ischemic changes noted. Vascular: No acute findings. Skull: Normal. Negative for fracture or focal lesion. Sinuses/Orbits: Opacification of the nasopharynx. The visualized paranasal sinuses and mastoid air cells are clear. Other: An endotracheal and an enteric tube are partially visualized. IMPRESSION: No significant interval change in the right thalamic hemorrhage with extension into the ventricular system since the prior CT. No change in the ventricular size. Continued close follow-up recommended. Electronically Signed   By: Anner Crete M.D.   On: 09/22/2021 02:15   DG Chest Port 1 View  Result Date: 09/22/2021 CLINICAL DATA:  Acute respiratory failure EXAM: PORTABLE CHEST 1 VIEW COMPARISON:  Yesterday FINDINGS: Endotracheal tube with tip between the clavicular heads and carina. Feeding tube with tip at the distal stomach. Right PICC with tip at the distal SVC. Low volume chest with indistinct perihilar opacity that could be atelectasis or infiltrate. No effusion, edema, or pneumothorax. IMPRESSION: Unremarkable hardware. Perihilar atelectasis or  infiltrate. Electronically Signed   By: Jorje Guild M.D.   On: 09/22/2021 04:57    PHYSICAL EXAM  Physical Exam  Constitutional: Appears well-developed and well-nourished.  Cardiovascular: Normal rate and regular rhythm.  Respiratory: Effort normal, non-labored breathing  Neuro: Intubated and sedated with fent and prop, drowsy, awakens with physical and verbal stimulation follows commands on the right, able to thumbs up, wiggle toes, and show 2 fingers with right extremities.  dolls eyes sluggish  Cranial Nerves: II, III,IV, VI: PERRLA, Right gaze preference- does not cross midline, Corneal reflex intact, but weak on the left;  V: Facial sensation is intact VII: Facial movement is obscured by ETAD VIII: Hearing is intact to voice X: Cough and gag intact XI: Head is midline XII: Unable to assess tongue Motor: Tone is normal. Bulk is normal. Left extremities are flaccid.  Rt upper and lower extremity movement is purposeful. Follows commands with right extremities   ASSESSMENT/PLAN [name] is a 61 y.o. male with history of AFib on Coumadin s/p recent conversion, HTN, HLD, remote tobacco abuse, Cataract OU, glaucoma, obesity, CVA, DM II, and OSA who initially presented to the ED via EMS left sided weakness, dysarthria, and right fixed gaze. Coumadin reversed. Labetalol and cleviprex used for BP greater than 140 during CT. Patient was then intubated due to somnolence and inability to protect his airway. CT and MRI show stable right thalamic hemorrhage with intraventricular extension. ICH score 2. Febrile. Hypertonic saline discontinued 1/23. Most recent sodium is 155.  Currently in NSR with flecainide continued. Holding home antihypertensive medications currently. Propofol and fentanyl infusing. No longer using cleviprex or norepinephrine (r/t hypotension after RSI) for BP control. PRN IVP vecuronium for vent synchronicity per CCM. Repeat CT shows no significant change in the  hemorrhage or ventricle size.  Stroke:  Acute right thalamic ICH and IVH likely secondary to hypertension in the setting of coumadin use Code stroke CT - Positive for acute right thalamic hemorrhage with intraventricular extension. Estimated intra-axial blood volume 25 mL. Mild Regional brain edema. Trace leftward midline shift. 1/22 Repeat CT shows no changes from code stroke CT MRI  Stable right thalamic hemorrhage since presentation. Regional edema, tracking into the midbrain. 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 No increased intracranial mass effect. MRA  Abundant chronic micro-hemorrhages in the brain superimposed on chronic lacunar infarcts compatible with advanced small vessel disease Carotid Doppler  1-39% stenosis in  Left and Right ICA 2D Echo EF 60-65% LDL 52 HgbA1c pending VTE prophylaxis - heparin warfarin daily prior to admission, now on No antithrombotic. ICH Therapy recommendations:  pending Disposition:  pending  Cerebral Edema with trace left midline shift Stable MRI and repeat CT scan Next CT - No significant interval change in the right thalamic hemorrhage with extension into the ventricular system since the prior CT. No change in the ventricular size Hypertonic saline discontinued NA- 150-> 154-> 156-> 155  Atrial Fibrillation Home meds: Flecainide, coreg, coumadin Flecainide continued, patient is in NSR currently  Hypertension Home meds:  Amlodipine, coreg, losartan-hydrochlorothiazide Restart gradually BP goal less than 160 BP labile post intubation, levophed d/c'd  Hyperlipidemia Home meds:  None LDL 52, goal < 70  Diabetes type II Uncontrolled Home meds:  Metformin, jardiance HgbA1c pending, goal < 7.0 CBGs SSI  Other Stroke Risk Factors Cigarette smoker, advised to stop smoking ETOH use, alcohol level No results found for requested labs within last 26280 hours., advised to drink no more than 2 drink(s) a day Obesity, Body mass  index is 35.19 kg/m., BMI >/= 30 associated with increased stroke risk, recommend weight loss, diet and exercise as appropriate  Obstructive sleep apnea  Other Active Problems Acute hypoxemic respiratory failure CCM consult for intubation and ventilator management Sedation with propofol and fentanyl Monitor triglycerides Difficult intubation r/t tracheal stenosis PRN paralytic for vent management  Hospital day # 2  Patient seen and examined by NP/APP with MD. MD to update note as needed.   Janine Ores, DNP, FNP-BC [facility] Pager: [phone]  ATTENDING NOTE: I reviewed above note and agree with the assessment and plan. Pt was seen and examined.   No family at bedside.  RN at bedside.  Patient was having dyssynchrony on vent overnight, put on paralytics.  Currently no movement due to paralytics.  Patient continues to have fever, on cooling blanket.  Blood culture pending.  Repeat CT stable hematoma and ventricular size.  Vent management per CCM.  BP goal less than 160.  For detailed assessment and plan, please refer to above as I have made changes wherever appropriate.   Rosalin Hawking, MD PhD Stroke Neurology 09/22/2021 6:31 PM  This patient is critically ill due to [place], IVH, A. fib on Coumadin, cerebral edema, respiratory failure and at significant risk of neurological worsening, death form brain herniation, hydrocephalus, heart failure, stroke. This patient's care requires constant monitoring of vital signs, hemodynamics, respiratory and cardiac monitoring, review of multiple databases, neurological assessment, discussion with family, other specialists and medical decision making of high complexity. I spent 35 minutes of neurocritical care time in the care of this patient.   To contact Stroke Continuity provider, please refer to [email or web address] After hours, contact General Neurology

## 2021-09-22 NOTE — Plan of Care (Signed)
  Problem: Safety: Goal: Non-violent Restraint(s) Outcome: Progressing   

## 2021-09-22 NOTE — Progress Notes (Signed)
PT Cancellation Note  Patient Details Name: Caleb Vaughan MRN: [id number] DOB: May 30, 1961   Cancelled Treatment:    Reason Eval/Treat Not Completed: Patient not medically ready (on paralytics). Will check back tomorrow.   Lyanne Co, PT  Acute Rehab Services  Pager [phone] Office [phone]    Lawana Chambers Ginna Schuur 09/22/2021, 10:22 AM

## 2021-09-22 NOTE — Progress Notes (Signed)
eLink Physician-Brief Progress Note Patient Name: Caleb Vaughan DOB: 1961-07-25 MRN: [id number]   Date of Service  09/22/2021  HPI/Events of Note  Patient with extreme ventilator dyssynchrony interfering with effective ventilation.  eICU Interventions  Versed 2 mg iv x 1 + Nimbex 0.1 mg / kg iv x 1, Ceiling on Fentanyl sedation increased.        [name] 09/22/2021, 6:24 AM

## 2021-09-22 NOTE — Progress Notes (Signed)
Dr. Amada Jupiter text paged number provided via amion about patient's sodium of 156.

## 2021-09-22 NOTE — Progress Notes (Signed)
RT called to bedside due to pt asynchrony with the vent. Pt peak pressuring, auto peep of 31, RT bagged pt to allow pt to fully exhale. RT x 2 bedside, bag lavage done resulting in small amounts of mucus, vent filters changed, Duo neb given.Pt remained asynchronous. RN at bedside, MD notified, paralytic ordered.

## 2021-09-23 ENCOUNTER — Encounter (HOSPITAL_COMMUNITY): Payer: Self-pay | Admitting: Neurology

## 2021-09-23 DIAGNOSIS — J9601 Acute respiratory failure with hypoxia: Secondary | ICD-10-CM | POA: Diagnosis not present

## 2021-09-23 DIAGNOSIS — I615 Nontraumatic intracerebral hemorrhage, intraventricular: Secondary | ICD-10-CM | POA: Diagnosis not present

## 2021-09-23 DIAGNOSIS — I4821 Permanent atrial fibrillation: Secondary | ICD-10-CM | POA: Diagnosis not present

## 2021-09-23 DIAGNOSIS — Z7901 Long term (current) use of anticoagulants: Secondary | ICD-10-CM | POA: Diagnosis not present

## 2021-09-23 DIAGNOSIS — I619 Nontraumatic intracerebral hemorrhage, unspecified: Secondary | ICD-10-CM | POA: Diagnosis not present

## 2021-09-23 DIAGNOSIS — J9602 Acute respiratory failure with hypercapnia: Secondary | ICD-10-CM

## 2021-09-23 LAB — BLOOD CULTURE ID PANEL (REFLEXED) - BCID2

## 2021-09-23 LAB — CBC
HCT: 43.4 % (ref 39.0–52.0)
Hemoglobin: 13.4 g/dL (ref 13.0–17.0)
MCH: 30 pg (ref 26.0–34.0)
MCHC: 30.9 g/dL (ref 30.0–36.0)
MCV: 97.1 fL (ref 80.0–100.0)
Platelets: 227 10*3/uL (ref 150–400)
RBC: 4.47 MIL/uL (ref 4.22–5.81)
RDW: 14.7 % (ref 11.5–15.5)
WBC: 12.7 10*3/uL — ABNORMAL HIGH (ref 4.0–10.5)
nRBC: 0 % (ref 0.0–0.2)

## 2021-09-23 LAB — BASIC METABOLIC PANEL
Anion gap: 8 (ref 5–15)
BUN: 26 mg/dL — ABNORMAL HIGH (ref 6–20)
CO2: 24 mmol/L (ref 22–32)
Calcium: 8.9 mg/dL (ref 8.9–10.3)
Chloride: 127 mmol/L — ABNORMAL HIGH (ref 98–111)
Creatinine, Ser: 1.2 mg/dL (ref 0.61–1.24)
GFR, Estimated: >60 mL/min (ref >60)
Glucose, Bld: 108 mg/dL — ABNORMAL HIGH (ref 70–99)
Potassium: 3.8 mmol/L (ref 3.5–5.1)
Sodium: 159 mmol/L — ABNORMAL HIGH (ref 135–145)

## 2021-09-23 LAB — GLUCOSE, CAPILLARY
Glucose-Capillary: 152 mg/dL — ABNORMAL HIGH (ref 70–99)
Glucose-Capillary: 170 mg/dL — ABNORMAL HIGH (ref 70–99)
Glucose-Capillary: 210 mg/dL — ABNORMAL HIGH (ref 70–99)
Glucose-Capillary: 227 mg/dL — ABNORMAL HIGH (ref 70–99)
Glucose-Capillary: 235 mg/dL — ABNORMAL HIGH (ref 70–99)
Glucose-Capillary: 254 mg/dL — ABNORMAL HIGH (ref 70–99)

## 2021-09-23 LAB — SODIUM
Sodium: 158 mmol/L — ABNORMAL HIGH (ref 135–145)
Sodium: 157 mmol/L — ABNORMAL HIGH (ref 135–145)
Sodium: 161 mmol/L (ref 135–145)

## 2021-09-23 LAB — TRIGLYCERIDES: Triglycerides: 330 mg/dL — ABNORMAL HIGH (ref <150)

## 2021-09-23 MED ORDER — BETHANECHOL CHLORIDE 10 MG PO TABS
10.0000 mg | ORAL_TABLET | Freq: Three times a day (TID) | ORAL | Status: DC
  Filled 2021-09-23: qty 1

## 2021-09-23 MED ORDER — POTASSIUM & SODIUM PHOSPHATES 280-160-250 MG PO PACK
1.0000 | PACK | Freq: Three times a day (TID) | ORAL | Status: AC
  Administered 2021-09-23 (×3): 1
  Filled 2021-09-23 (×3): qty 1

## 2021-09-23 MED ORDER — BUSPIRONE HCL 15 MG PO TABS
30.0000 mg | ORAL_TABLET | Freq: Three times a day (TID) | ORAL | Status: DC
  Administered 2021-09-23 – 2021-09-25 (×7): 30 mg
  Filled 2021-09-23 (×7): qty 2

## 2021-09-23 MED ORDER — POTASSIUM CHLORIDE 20 MEQ PO PACK
20.0000 meq | PACK | Freq: Once | ORAL | Status: AC
Start: 2021-09-23 — End: 2021-09-23
  Administered 2021-09-23: 09:00:00 20 meq
  Filled 2021-09-23: qty 1

## 2021-09-23 MED ORDER — LABETALOL HCL 5 MG/ML IV SOLN
10.0000 mg | INTRAVENOUS | Status: DC | PRN
  Administered 2021-09-23 – 2021-09-25 (×8): 10 mg via INTRAVENOUS
  Filled 2021-09-23 (×9): qty 4

## 2021-09-23 MED ORDER — BETHANECHOL CHLORIDE 10 MG PO TABS
10.0000 mg | ORAL_TABLET | Freq: Three times a day (TID) | ORAL | Status: DC
  Administered 2021-09-23 – 2021-09-28 (×15): 10 mg
  Filled 2021-09-23 (×14): qty 1

## 2021-09-23 MED ORDER — INSULIN DETEMIR 100 UNIT/ML ~~LOC~~ SOLN
10.0000 [IU] | Freq: Two times a day (BID) | SUBCUTANEOUS | Status: DC
  Administered 2021-09-23 (×2): 10 [IU] via SUBCUTANEOUS
  Filled 2021-09-23 (×4): qty 0.1

## 2021-09-23 MED ORDER — LABETALOL HCL 5 MG/ML IV SOLN: 10.0000 mg | INTRAVENOUS | Status: DC | PRN

## 2021-09-23 MED ORDER — BETHANECHOL CHLORIDE 10 MG PO TABS: 10.0000 mg | ORAL_TABLET | Freq: Three times a day (TID) | ORAL | Status: DC

## 2021-09-23 NOTE — Progress Notes (Signed)
PHARMACY - PHYSICIAN COMMUNICATION CRITICAL VALUE ALERT - BLOOD CULTURE IDENTIFICATION (BCID)  Caleb Vaughan is an 61 y.o. male who presented to [facility] on 09/20/2021 with a chief complaint of acute R thalamic ICH  Assessment:  1/4 BCx positive for GPC  in clusters. BCID shows coag negative staph epi with mecA+. Likely contaminant.   Name of physician (or Provider) Contacted: Dr. Everardo All  Current antibiotics: None  Changes to prescribed antibiotics recommended: No antibiotic therapy recommended   Recommendations accepted by provider but MD requested that pharmacist follow up with AM rounding team to ensure no further action is necessary.   Results for orders placed or performed during the hospital encounter of 09/20/21  Blood Culture ID Panel (Reflexed) (Collected: 09/22/2021  4:48 AM)  Result Value Ref Range   Enterococcus faecalis NOT DETECTED NOT DETECTED   Enterococcus Faecium NOT DETECTED NOT DETECTED   Listeria monocytogenes NOT DETECTED NOT DETECTED   Staphylococcus species DETECTED (A) NOT DETECTED   Staphylococcus aureus (BCID) NOT DETECTED NOT DETECTED   Staphylococcus epidermidis DETECTED (A) NOT DETECTED   Staphylococcus lugdunensis NOT DETECTED NOT DETECTED   Streptococcus species NOT DETECTED NOT DETECTED   Streptococcus agalactiae NOT DETECTED NOT DETECTED   Streptococcus pneumoniae NOT DETECTED NOT DETECTED   Streptococcus pyogenes NOT DETECTED NOT DETECTED   A.calcoaceticus-baumannii NOT DETECTED NOT DETECTED   Bacteroides fragilis NOT DETECTED NOT DETECTED   Enterobacterales NOT DETECTED NOT DETECTED   Enterobacter cloacae complex NOT DETECTED NOT DETECTED   Escherichia coli NOT DETECTED NOT DETECTED   Klebsiella aerogenes NOT DETECTED NOT DETECTED   Klebsiella oxytoca NOT DETECTED NOT DETECTED   Klebsiella pneumoniae NOT DETECTED NOT DETECTED   Proteus species NOT DETECTED NOT DETECTED   Salmonella species NOT DETECTED NOT DETECTED   Serratia marcescens  NOT DETECTED NOT DETECTED   Haemophilus influenzae NOT DETECTED NOT DETECTED   Neisseria meningitidis NOT DETECTED NOT DETECTED   Pseudomonas aeruginosa NOT DETECTED NOT DETECTED   Stenotrophomonas maltophilia NOT DETECTED NOT DETECTED   Candida albicans NOT DETECTED NOT DETECTED   Candida auris NOT DETECTED NOT DETECTED   Candida glabrata NOT DETECTED NOT DETECTED   Candida krusei NOT DETECTED NOT DETECTED   Candida parapsilosis NOT DETECTED NOT DETECTED   Candida tropicalis NOT DETECTED NOT DETECTED   Cryptococcus neoformans/gattii NOT DETECTED NOT DETECTED   Methicillin resistance mecA/C DETECTED (A) NOT DETECTED    Vinnie Level, PharmD., BCCCP Clinical Pharmacist Please refer to [facility] for unit-specific pharmacist

## 2021-09-23 NOTE — Progress Notes (Signed)
Updated Dr. Leonel Ramsay about patient's sodium of 158. No new orders at this time.

## 2021-09-23 NOTE — Progress Notes (Signed)
OT Cancellation Note  Patient Details Name: Caleb Vaughan MRN: [id number] DOB: [birth year]   Cancelled Treatment:    Reason Eval/Treat Not Completed: Patient not medically ready (Remains sedated/on vent)  Ramond Dial, OT/L   Acute OT Clinical Specialist Acute Rehabilitation Services Pager [phone] Office [phone]  09/23/2021, 7:01 AM

## 2021-09-23 NOTE — Progress Notes (Addendum)
Pt placed back on full vent support due to apnea and low min. Ventilation. Pt tolerating well at this time, RN aware, RT will continue to monitor.

## 2021-09-23 NOTE — Progress Notes (Addendum)
STROKE TEAM PROGRESS NOTE   INTERVAL HISTORY Patient is seen in his room with no family at the bedside.  He has been hemodynamically stable overnight but continues to be febrile.  His neurological exam on sedation is poor with patient unable to follow commands today.  Vitals:   09/23/21 0900 09/23/21 1000 09/23/21 1100 09/23/21 1102  BP: (!) 126/55 (!) 115/94 (!) 143/81 (!) 143/81  Pulse: 81 91 83 80  Resp: 14 17 18 15   Temp:      TempSrc:      SpO2: 95% 97% 99% 99%  Weight:      Height:       CBC:  Recent Labs  Lab 09/20/21 0908 09/20/21 0913 09/22/21 0523 09/23/21 0556  WBC 9.1   < > 13.5* 12.7*  NEUTROABS 6.6  --   --   --   HGB 16.2   < > 13.6 13.4  HCT 46.1   < > 42.1 43.4  MCV 86.7   < > 95.0 97.1  PLT 248   < > 213 227   < > = values in this interval not displayed.    Basic Metabolic Panel:  Recent Labs  Lab 09/22/21 0523 09/22/21 1114 09/22/21 1710 09/23/21 0020 09/23/21 0556 09/23/21 1052  NA 155*   < > 154*   < > 159* 157*  K 3.9  --   --   --  3.8  --   CL 123*  --   --   --  127*  --   CO2 23  --   --   --  24  --   GLUCOSE 218*  --   --   --  108*  --   BUN 23*  --   --   --  26*  --   CREATININE 1.35*  --   --   --  1.20  --   CALCIUM 9.0  --   --   --  8.9  --   MG 2.6*  --  2.8*  --   --   --   PHOS 3.8  --  2.3*  --   --   --    < > = values in this interval not displayed.    Lipid Panel:  Recent Labs  Lab 09/21/21 0554 09/23/21 0556  CHOL 129  --   TRIG 242*   263* 330*  HDL 29*  --   CHOLHDL 4.4  --   VLDL 48*  --   LDLCALC 52  --     HgbA1c:  Recent Labs  Lab 09/21/21 0554  HGBA1C 9.7*    Urine Drug Screen:  Recent Labs  Lab 09/21/21 0303  LABOPIA NONE DETECTED  COCAINSCRNUR NONE DETECTED  LABBENZ NONE DETECTED  AMPHETMU NONE DETECTED  THCU NONE DETECTED  LABBARB NONE DETECTED     Alcohol Level No results for input(s): ETH in the last 168 hours.  IMAGING past 24 hours No results found.  PHYSICAL  EXAM  Physical Exam  Constitutional: Appears well-developed and well-nourished.  Cardiovascular: Normal rate and regular rhythm.  Respiratory: Respirations synchronous with ventilator  Neuro: Intubated and sedated with fent and prop, does not respond to voice. PERRL with bilateral corneal reflexes, weaker on the left.  Cough reflex present  Will flicker RUE and RLE to noxious stimuli, LUE and LEE do not move to stimuli.  ASSESSMENT/PLAN Mr. Ernesto Zukowski is a 61 y.o. male with history of AFib on Coumadin s/p  recent conversion, HTN, HLD, remote tobacco abuse, Cataract OU, glaucoma, obesity, CVA, DM II, and OSA who initially presented to the ED via EMS left sided weakness, dysarthria, and right fixed gaze. Coumadin reversed. Labetalol and cleviprex used for BP greater than 140 during CT. Patient was then intubated due to somnolence and inability to protect his airway. CT and MRI show stable right thalamic hemorrhage with intraventricular extension. ICH score 2. Febrile. Hypertonic saline discontinued 1/23. Most recent sodium is 155.  Currently in NSR with flecainide continued. Holding home antihypertensive medications currently. Propofol and fentanyl infusing. No longer using cleviprex or norepinephrine (r/t hypotension after RSI) for BP control. PRN IVP vecuronium for vent synchronicity per CCM. Repeat CT shows no significant change in the hemorrhage or ventricle size.  Stroke:  Acute right thalamic ICH and IVH likely secondary to hypertension in the setting of coumadin use Code stroke CT - Positive for acute right thalamic hemorrhage with intraventricular extension. Estimated intra-axial blood volume 25 mL. Mild Regional brain edema. Trace leftward midline shift. 1/22 Repeat CT shows no changes from code stroke CT MRI  Stable right thalamic hemorrhage since presentation. Regional edema, tracking into the midbrain. 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 No increased intracranial  mass effect. MRA  Abundant chronic micro-hemorrhages in the brain superimposed on chronic lacunar infarcts compatible with advanced small vessel disease Carotid Doppler  1-39% stenosis in Left and Right ICA 2D Echo EF 60-65% LDL 52 HgbA1c 9.7 VTE prophylaxis - heparin warfarin daily prior to admission, now on No antithrombotic. ICH Therapy recommendations:  pending Disposition:  pending  Cerebral Edema with trace left midline shift Stable MRI and repeat CT scan Next CT - No significant interval change in the right thalamic hemorrhage with extension into the ventricular system since the prior CT. No change in the ventricular size Hypertonic saline discontinued NA- 150-> 154-> 156-> 155 -> 157  Atrial Fibrillation Home meds: Flecainide, coreg, coumadin Flecainide continued, patient is in NSR currently  Hypertension Home meds:  Amlodipine, coreg, losartan-hydrochlorothiazide Restart gradually BP goal less than 160 BP labile post intubation, levophed d/c'd  Hyperlipidemia Home meds:  None LDL 52, goal < 70  Diabetes type II Uncontrolled Home meds:  Metformin, jardiance HgbA1c 9.7 , goal < 7.0 CBGs SSI  Other Stroke Risk Factors Cigarette smoker, advised to stop smoking ETOH use, alcohol level No results found for requested labs within last [id number] hours., advised to drink no more than 2 drink(s) a day Obesity, Body mass index is 35.19 kg/m., BMI >/= 30 associated with increased stroke risk, recommend weight loss, diet and exercise as appropriate  Obstructive sleep apnea  Other Active Problems Acute hypoxemic respiratory failure CCM consult for intubation and ventilator management Sedation with propofol and fentanyl Monitor triglycerides Difficult intubation r/t tracheal stenosis PRN paralytic for vent management  Hospital day # 3  Patient seen and examined by NP/APP with MD. MD to update note as needed.    Cortney E Ernestina Columbiae La Torre , MSN, AGACNP-BC [facility] See Amion for schedule and pager information 09/23/2021 11:50 AM   ATTENDING NOTE: I reviewed above note and agree with the assessment and plan. Pt was seen and examined.   Patient still no family at bedside.  Patient still intubated, unresponsive, however off paralytics, able to withdraw to pain on the right upper and lower extremities.  However, not open eyes on voice, not follow commands.  Pupil equal but sluggish to light.  Still has a fever, on cooling  blanket.  Had a shivering, put on BuSpar.  Plan to taper off sedation today if possible.  Sodium level 157.  On diuresis per CCM.  On tube feeding.  For detailed assessment and plan, please refer to above as I have made changes wherever appropriate.   Marvel Plan, MD PhD Stroke Neurology 09/23/2021 8:11 PM  This patient is critically ill due to ICH, IVH, A. fib, respiratory failure, fever and at significant risk of neurological worsening, death form hematoma expansion, obstructive hydrocephalus, heart failure, sepsis. This patient's care requires constant monitoring of vital signs, hemodynamics, respiratory and cardiac monitoring, review of multiple databases, neurological assessment, discussion with family, other specialists and medical decision making of high complexity. I spent 35 minutes of neurocritical care time in the care of this patient.    To contact Stroke Continuity provider, please refer to [email or web address]. After hours, contact General Neurology

## 2021-09-23 NOTE — Progress Notes (Signed)
PT Cancellation Note  Patient Details Name: Caleb Vaughan MRN: [id number] DOB: August 29, 1961   Cancelled Treatment:    Reason Eval/Treat Not Completed: Patient not medically ready Remains sedated and on vent. Will attempt once medically ready.   Caleb Vaughan A. Dan Humphreys PT, DPT Acute Rehabilitation Services Pager [phone] Office [phone]    Caleb Vaughan 09/23/2021, 7:40 AM

## 2021-09-23 NOTE — Progress Notes (Signed)
eLink Physician-Brief Progress Note Patient Name: Idrees Quam DOB: 03/04/1961 MRN: [id number]   Date of Service  09/23/2021  HPI/Events of Note  Pt with urinary retention. >650 ml indicated on bladder scan  eICU Interventions  Placed order for foley catheter.        Andrika Peraza M DELA CRUZ 09/23/2021, 10:25 PM

## 2021-09-23 NOTE — Consult Note (Deleted)
NAME:  Caleb Vaughan, MRN:  [id number], DOB:  03/20/1961, LOS: 3 ADMISSION DATE:  09/20/2021, CONSULTATION DATE:  09/20/2021 REFERRING MD:  Dr. Oretha Milch, CHIEF COMPLAINT:  Hemorrhagic stroke    History of Present Illness:  Caleb Vaughan is a 61 y.o. male with PMH listed below  who presented to the emergency department 1/22 as a code stroke, last known normal 2 AM.   Stroke CT on admit revealed, positive for acute right thalamic hemorrhage with intraventricular extension with estimated intra-axial blood volume 25 mL. Mild Regional brain edema. Trace leftward midline shift. Patient was intubated in ED and PCCM was consulted for further assessment   Pertinent  Medical History  A-fin on coumadin  HTN HLD Previous smoker  Prior CVA  Diabetes  OSA   Significant Hospital Events: Including procedures, antibiotic start and stop dates in addition to other pertinent events   1/22 Admitted as code stroke found to have right thalamic bleed with trace leftward shift. Intubated in ED for increasing somnolence and inability to protect airway.   Interim History / Subjective:  Tolerated PSV for most of day yesterday. Developed ventilator dyssynchrony overnight and fever and was given a dose of paralytic.  Febrile overnight.   Objective   Blood pressure (!) 159/81, pulse 98, temperature 99.8 F (37.7 C), temperature source Esophageal, resp. rate 16, height 5\' 4"  (1.626 m), weight 93 kg, SpO2 99 %.    Vent Mode: PSV;CPAP FiO2 (%):  [40 %] 40 % Set Rate:  [16 bmp] 16 bmp Vt Set:  [470 mL] 470 mL PEEP:  [5 cmH20] 5 cmH20 Pressure Support:  [10 cmH20] 10 cmH20 Plateau Pressure:  [12 cmH20-20 cmH20] 16 cmH20   Intake/Output Summary (Last 24 hours) at 09/23/2021 0840 Last data filed at 09/23/2021 0700 Gross per 24 hour  Intake 2325.97 ml  Output 1680 ml  Net 645.97 ml    Filed Weights   09/20/21 0900  Weight: 93 kg    Examination: General: Acute on chronically ill appearing middle aged  male on mechanical ventilation, in NAD HEENT: ETT, MM pink/moist, PERRL,  Neuro: no response to stimulation.  CV: s1s2 regular rate and rhythm, no murmur, rubs, or gallops,  PULM:  chest clear, occasional double stacking.  GI: soft, bowel sounds active in all 4 quadrants, non-tender, non-distended Extremities: warm/dry, no edema  Skin: no rashes or lesions  Ancillary tests personally reviewed.   CT head shows right thalamic bleed with intraventricular extension. MRI shows no evidence of AVM or other source of bleeding.  Hypernatremia at 155 Creatinine 1.35 - improving.  Assessment & Plan:   Principal Problem:   Nontraumatic thalamic hemorrhage (HCC) Active Problems:   Respiratory failure with hypoxia (HCC)   Aspiration pneumonitis (HCC)   Paroxysmal A-fib (HCC)   Obstructive sleep apnea   Type 2 diabetes mellitus (HCC)   Tracheal stenosis   Acute hypernatremia   Acute kidney injury (HCC)  Plan:   - continue full lung protective ventilatory support through first 48h as may decline further due to cerebral edema.  - extubation will likely be complicated by tracheal stenosis. May require tracheostomy. - stopped 3% saline - allow to drift down, now at 48 h mark post event. If remains stable/improves consider trial of extubation at 4-5 day mark. Otherwise proceed with tracheostomy. - Cultures results pending. Patient may have central fever from thalamic bleed - Wean sedation to obtain examination. keep RASS -1,  - off anticoagulation for at least 1 month. Long-term anticoagulation plan will  depend on degree of neurological recovery.   - Currently in sinus rhythm, continue flecainide.  - maintain SBP 110-160, if , holding home antihypertensives for now  - add tube feed coverage insulin - follow renal function for now.   Best Practice (right click and "Reselect all SmartList Selections" daily)   Diet/type: tubefeeds DVT prophylaxis: SCD - initiate chemical prophylaxis if  follow-up scan unchanged .  GI prophylaxis: PPI Lines: N/A Foley:  N/A Code Status:  full code Last date of multidisciplinary goals of care discussion: Son updated at bedside am of 1/22   Performed by: Lynnell Catalan  Total critical care time: 40 minutes  Critical care time was exclusive of separately billable procedures and treating other patients.  Critical care was necessary to treat or prevent imminent or life-threatening deterioration.  Critical care was time spent personally by me on the following activities: development of treatment plan with patient and/or surrogate as well as nursing, discussions with consultants, evaluation of patient's response to treatment, examination of patient, obtaining history from patient or surrogate, ordering and performing treatments and interventions, ordering and review of laboratory studies, ordering and review of radiographic studies, pulse oximetry and re-evaluation of patient's condition.  Lynnell Catalan, MD [facility] ICU Physician [facility] [place] [facility]  Pager: [phone] Or Epic Secure Chat After hours: [phone].  09/23/2021, 8:40 AM     09/23/2021, 8:40 AM

## 2021-09-23 NOTE — Progress Notes (Signed)
[facility] Progress Note Patient Name: Caleb Vaughan DOB: 09/13/1960 MRN: [id number]   Date of Service  09/23/2021  HPI/Events of Note  Notified 1/4 BCx with [facility] in clusters. BCID shows coag negative staph epi. Likely contaminant. Not on abx currently.  He has Tmax 101 and WBC 13.5. Suspect this is central  eICU Interventions  Hold on antibiotics for now. Day team to re-evaluate      Intervention Category Minor Interventions: Other:  Caleb Vaughan Pickle 09/23/2021, 3:49 AM

## 2021-09-23 NOTE — Consult Note (Addendum)
NAME:  Caleb Vaughan, MRN:  [id number], DOB:  21-May-1961, LOS: 3 ADMISSION DATE:  09/20/2021, CONSULTATION DATE:  09/20/2021 REFERRING MD:  Dr. Carren Rang, CHIEF COMPLAINT:  Hemorrhagic stroke    History of Present Illness:  Caleb Vaughan is a 61 y.o. male with PMH listed below  who presented to the emergency department 1/22 as a code stroke, last known normal 2 AM.   Stroke CT on admit revealed, positive for acute right thalamic hemorrhage with intraventricular extension with estimated intra-axial blood volume 25 mL. Mild Regional brain edema. Trace leftward midline shift. Patient was intubated in ED and PCCM was consulted for further assessment   Pertinent  Medical History  A-fin on coumadin  HTN HLD Previous smoker  Prior CVA  Diabetes  OSA   Significant Hospital Events: Including procedures, antibiotic start and stop dates in addition to other pertinent events   1/22 Admitted as code stroke found to have right thalamic bleed with trace leftward shift. Intubated in ED for increasing somnolence and inability to protect airway.  1/24 increased respiratory distress requiring increased sedation and intermittent neuromuscular blockade.  Improved with diuresis.  3% saline stopped for increasing sodium above 159  Interim History / Subjective:  Dyssynchrony yesterday required neuromuscular blockade.  Good response to diuresis.  Able to come down on sedation overnight.  Objective   Blood pressure (!) 159/81, pulse 98, temperature 99.8 F (37.7 C), temperature source Esophageal, resp. rate 16, height 5\' 4"  (1.626 m), weight 93 kg, SpO2 99 %.    Vent Mode: PSV;CPAP FiO2 (%):  [40 %] 40 % Set Rate:  [16 bmp] 16 bmp Vt Set:  [470 mL] 470 mL PEEP:  [5 cmH20] 5 cmH20 Pressure Support:  [10 cmH20] 10 cmH20 Plateau Pressure:  [12 cmH20-20 cmH20] 16 cmH20   Intake/Output Summary (Last 24 hours) at 09/23/2021 0841 Last data filed at 09/23/2021 0700 Gross per 24 hour  Intake 2325.97 ml   Output 1680 ml  Net 645.97 ml    Filed Weights   09/20/21 0900  Weight: 93 kg    Examination: General: Acute on chronically ill appearing middle aged male on mechanical ventilation, in NAD HEENT: ETT, MM pink/moist, PERRL, small bore feeding tube in place Neuro: Does not open his eyes but will follow commands on the right side.  Extensor posturing on the left. CV: s1s2 regular rate and rhythm, no murmur, rubs, or gallops,  PULM:  chest clear, more synchronous with ventilation today GI: soft, bowel sounds active in all 4 quadrants, non-tender, non-distended Extremities: warm/dry, no edema  Skin: no rashes or lesions  Ancillary tests personally reviewed.   CT head shows right thalamic bleed with intraventricular extension. MRI shows no evidence of AVM or other source of bleeding.  Hypernatremia at 159 Creatinine 1.20 - improving.  Assessment & Plan:   Principal Problem:   Nontraumatic thalamic hemorrhage ([place]) Active Problems:   Respiratory failure with hypoxia (HCC)   Aspiration pneumonitis (HCC)   Paroxysmal A-fib (HCC)   Obstructive sleep apnea   Type 2 diabetes mellitus (HCC)   Tracheal stenosis   Acute hypernatremia   Acute kidney injury ([place])  Plan:   -Start daily SBT - extubation will likely be complicated by tracheal stenosis. May require tracheostomy. - stopped 3% saline - allow to drift down, now at 48 h mark post event. If remains stable/improves consider trial of extubation at 4-5 day mark. Otherwise proceed with tracheostomy. -Patient likely has central fever from thalamic bleed, BuSpar added - Wean  sedation to obtain examination. keep RASS -1,  - off anticoagulation for at least 1 month. Long-term anticoagulation plan will depend on degree of neurological recovery.   - Currently in sinus rhythm, continue flecainide.  - maintain SBP 110-160, if , holding home antihypertensives for now  -Add long-acting insulin   Best Practice (right click and "Reselect  all SmartList Selections" daily)   Diet/type: tubefeeds DVT prophylaxis: SCD - initiate chemical prophylaxis if follow-up scan unchanged .  GI prophylaxis: PPI Lines: N/A Foley:  N/A Code Status:  full code Last date of multidisciplinary goals of care discussion: Son updated at bedside am of 1/25.  Informed him that edema accompanying initial bleed should start to subside in the next 2 to 3 days and that we should then have a better sense of what his neurological status is.  I did indicate to him that unless he was remarkably better he would likely require a tracheostomy as a bridge to recovery over the next 2 to 3 months.   Performed by: Kipp Brood  Total critical care time: 40 minutes  Critical care time was exclusive of separately billable procedures and treating other patients.  Critical care was necessary to treat or prevent imminent or life-threatening deterioration.  Critical care was time spent personally by me on the following activities: development of treatment plan with patient and/or surrogate as well as nursing, discussions with consultants, evaluation of patient's response to treatment, examination of patient, obtaining history from patient or surrogate, ordering and performing treatments and interventions, ordering and review of laboratory studies, ordering and review of radiographic studies, pulse oximetry and re-evaluation of patient's condition.  Kipp Brood, MD [facility] ICU Physician [place]  Pager: [phone] Or Epic Secure Chat After hours: [phone].  09/23/2021, 8:41 AM     09/23/2021, 8:41 AM

## 2021-09-23 NOTE — Progress Notes (Signed)
ABG results obtained were for another patient.  Wrong patient label was grabbed to place on sample.  Point of care edit sheet filled out and obtained.     Latest Reference Range & Units 09/23/21 16:12  Sample type  ARTERIAL  pH, Arterial 7.350 - 7.450  7.193 (LL)  pCO2 arterial 32.0 - 48.0 mmHg 66.8 (HH)  pO2, Arterial 83.0 - 108.0 mmHg 78 (L)  TCO2 22 - 32 mmol/L 28  Acid-base deficit 0.0 - 2.0 mmol/L 3.0 (H)  Bicarbonate 20.0 - 28.0 mmol/L 25.9  O2 Saturation % 92.0  Patient temperature  97.6 F  Collection site  art line

## 2021-09-24 DIAGNOSIS — I619 Nontraumatic intracerebral hemorrhage, unspecified: Secondary | ICD-10-CM

## 2021-09-24 DIAGNOSIS — E119 Type 2 diabetes mellitus without complications: Secondary | ICD-10-CM

## 2021-09-24 DIAGNOSIS — I10 Essential (primary) hypertension: Secondary | ICD-10-CM

## 2021-09-24 DIAGNOSIS — I63412 Cerebral infarction due to embolism of left middle cerebral artery: Secondary | ICD-10-CM

## 2021-09-24 LAB — BASIC METABOLIC PANEL
BUN: 31 mg/dL — ABNORMAL HIGH (ref 6–20)
CO2: 24 mmol/L (ref 22–32)
Calcium: 9.2 mg/dL (ref 8.9–10.3)
Chloride: >130 mmol/L (ref 98–111)
Creatinine, Ser: 1.09 mg/dL (ref 0.61–1.24)
GFR, Estimated: >60 mL/min (ref >60)
Glucose, Bld: 238 mg/dL — ABNORMAL HIGH (ref 70–99)
Potassium: 3.8 mmol/L (ref 3.5–5.1)
Sodium: 162 mmol/L (ref 135–145)

## 2021-09-24 LAB — GLUCOSE, CAPILLARY
Glucose-Capillary: 202 mg/dL — ABNORMAL HIGH (ref 70–99)
Glucose-Capillary: 199 mg/dL — ABNORMAL HIGH (ref 70–99)
Glucose-Capillary: 190 mg/dL — ABNORMAL HIGH (ref 70–99)
Glucose-Capillary: 194 mg/dL — ABNORMAL HIGH (ref 70–99)
Glucose-Capillary: 243 mg/dL — ABNORMAL HIGH (ref 70–99)
Glucose-Capillary: 155 mg/dL — ABNORMAL HIGH (ref 70–99)

## 2021-09-24 LAB — CBC
HCT: 42.3 % (ref 39.0–52.0)
Hemoglobin: 13.3 g/dL (ref 13.0–17.0)
MCH: 30.8 pg (ref 26.0–34.0)
MCHC: 31.4 g/dL (ref 30.0–36.0)
MCV: 97.9 fL (ref 80.0–100.0)
Platelets: 238 10*3/uL (ref 150–400)
RBC: 4.32 MIL/uL (ref 4.22–5.81)
RDW: 14.6 % (ref 11.5–15.5)
WBC: 11.1 10*3/uL — ABNORMAL HIGH (ref 4.0–10.5)
nRBC: 0 % (ref 0.0–0.2)

## 2021-09-24 LAB — POCT I-STAT 7, (LYTES, BLD GAS, ICA,H+H)
Acid-base deficit: 3 mmol/L — ABNORMAL HIGH (ref 0.0–2.0)
Bicarbonate: 25.9 mmol/L (ref 20.0–28.0)
Calcium, Ion: 1.18 mmol/L (ref 1.15–1.40)
HCT: 32 % — ABNORMAL LOW (ref 39.0–52.0)
Hemoglobin: 10.9 g/dL — ABNORMAL LOW (ref 13.0–17.0)
O2 Saturation: 92 %
Patient temperature: 97.6
Potassium: 4.4 mmol/L (ref 3.5–5.1)
Sodium: 141 mmol/L (ref 135–145)
TCO2: 28 mmol/L (ref 22–32)
pCO2 arterial: 66.8 mmHg (ref 32.0–48.0)
pH, Arterial: 7.193 — CL (ref 7.350–7.450)
pO2, Arterial: 78 mmHg — ABNORMAL LOW (ref 83.0–108.0)

## 2021-09-24 LAB — CULTURE, BLOOD (ROUTINE X 2): Special Requests: ADEQUATE

## 2021-09-24 LAB — PHOSPHORUS: Phosphorus: 3.4 mg/dL (ref 2.5–4.6)

## 2021-09-24 LAB — SODIUM
Sodium: 161 mmol/L (ref 135–145)
Sodium: 161 mmol/L (ref 135–145)
Sodium: 162 mmol/L (ref 135–145)
Sodium: 164 mmol/L (ref 135–145)

## 2021-09-24 MED ORDER — CARVEDILOL 12.5 MG PO TABS
25.0000 mg | ORAL_TABLET | Freq: Once | ORAL | Status: AC
  Administered 2021-09-24: 25 mg

## 2021-09-24 MED ORDER — POTASSIUM CHLORIDE 20 MEQ PO PACK
40.0000 meq | PACK | Freq: Once | ORAL | Status: AC
  Administered 2021-09-24: 40 meq
  Filled 2021-09-24: qty 2

## 2021-09-24 MED ORDER — FREE WATER
400.0000 mL | Status: DC
  Administered 2021-09-24 – 2021-09-25 (×4): 400 mL

## 2021-09-24 MED ORDER — PROSOURCE TF PO LIQD
45.0000 mL | Freq: Three times a day (TID) | ORAL | Status: DC
  Administered 2021-09-24 – 2021-10-01 (×24): 45 mL
  Filled 2021-09-24 (×23): qty 45

## 2021-09-24 MED ORDER — CARVEDILOL 12.5 MG PO TABS
25.0000 mg | ORAL_TABLET | Freq: Two times a day (BID) | ORAL | Status: DC
  Filled 2021-09-24: qty 2

## 2021-09-24 MED ORDER — FREE WATER
400.0000 mL | Freq: Once | Status: AC
  Administered 2021-09-24: 400 mL

## 2021-09-24 MED ORDER — CARVEDILOL 12.5 MG PO TABS
25.0000 mg | ORAL_TABLET | Freq: Two times a day (BID) | ORAL | Status: DC
  Administered 2021-09-24 – 2021-10-16 (×42): 25 mg
  Filled 2021-09-24 (×43): qty 2

## 2021-09-24 MED ORDER — AMLODIPINE BESYLATE 10 MG PO TABS
10.0000 mg | ORAL_TABLET | Freq: Every day | ORAL | Status: DC
  Administered 2021-09-24 – 2021-10-16 (×21): 10 mg
  Filled 2021-09-24 (×21): qty 1

## 2021-09-24 MED ORDER — FLECAINIDE ACETATE 50 MG PO TABS
150.0000 mg | ORAL_TABLET | Freq: Two times a day (BID) | ORAL | Status: DC
  Administered 2021-09-24 – 2021-10-16 (×43): 150 mg
  Filled 2021-09-24 (×46): qty 1

## 2021-09-24 MED ORDER — VITAL AF 1.2 CAL PO LIQD
1000.0000 mL | ORAL | Status: DC
  Administered 2021-09-24 – 2021-09-29 (×8): 1000 mL

## 2021-09-24 MED ORDER — FLECAINIDE ACETATE 50 MG PO TABS
150.0000 mg | ORAL_TABLET | Freq: Two times a day (BID) | ORAL | Status: DC
  Filled 2021-09-24: qty 1

## 2021-09-24 MED ORDER — BISACODYL 10 MG RE SUPP
10.0000 mg | Freq: Once | RECTAL | Status: AC
  Administered 2021-09-24: 10 mg via RECTAL
  Filled 2021-09-24: qty 1

## 2021-09-24 MED ORDER — FREE WATER
200.0000 mL | Freq: Three times a day (TID) | Status: DC
  Administered 2021-09-24 (×2): 200 mL

## 2021-09-24 MED ORDER — FREE WATER
200.0000 mL | Status: DC
  Administered 2021-09-24 (×2): 200 mL

## 2021-09-24 MED ORDER — AMLODIPINE BESYLATE 10 MG PO TABS: 10.0000 mg | ORAL_TABLET | Freq: Every day | ORAL | Status: DC

## 2021-09-24 MED ORDER — INSULIN DETEMIR 100 UNIT/ML ~~LOC~~ SOLN
20.0000 [IU] | Freq: Two times a day (BID) | SUBCUTANEOUS | Status: DC
  Administered 2021-09-24 – 2021-09-26 (×5): 20 [IU] via SUBCUTANEOUS
  Filled 2021-09-24 (×6): qty 0.2

## 2021-09-24 MED ORDER — IPRATROPIUM-ALBUTEROL 0.5-2.5 (3) MG/3ML IN SOLN
3.0000 mL | Freq: Three times a day (TID) | RESPIRATORY_TRACT | Status: DC
  Administered 2021-09-24 – 2021-09-25 (×2): 3 mL via RESPIRATORY_TRACT
  Filled 2021-09-24: qty 3

## 2021-09-24 NOTE — Progress Notes (Signed)
OT Cancellation Note  Patient Details Name: Caleb Vaughan MRN: [id number] DOB: [birth year]   Cancelled Treatment:    Reason Eval/Treat Not Completed: Other (comment) (Discussed with Dr Roda Shutters who recommended we sign off at this time. OT to be reordered when medically appropriate.)  [facility] Luisa Dago, OT/L   Acute OT Clinical Specialist Acute Rehabilitation Services Pager [phone] Office [phone]  09/24/2021, 7:49 AM

## 2021-09-24 NOTE — Progress Notes (Signed)
Per Dr. Mervyn Skeeters, V.O. pt's free water flushes increased to 400 cc/hr.

## 2021-09-24 NOTE — Progress Notes (Signed)
PT Cancellation Note  Patient Details Name: Caleb Vaughan MRN: [id number] DOB: [birth year]   Cancelled Treatment:    Reason Eval/Treat Not Completed: PT screened, no needs identified, will sign off - per protocol, PT to sign off given at least x3 medical cancels. Please re-consult PT services when medically appropriate.  Marye Round, PT DPT Acute Rehabilitation Services Pager [phone]  Office [phone]    Truddie Coco 09/24/2021, 8:13 AM

## 2021-09-24 NOTE — Plan of Care (Signed)
?  Problem: Education: ?Goal: Knowledge of disease or condition will improve ?Outcome: Progressing ?Goal: Knowledge of secondary prevention will improve (SELECT ALL) ?Outcome: Progressing ?Goal: Knowledge of patient specific risk factors will improve (INDIVIDUALIZE FOR PATIENT) ?Outcome: Progressing ?Goal: Individualized Educational Video(s) ?Outcome: Progressing ?  ?Problem: Coping: ?Goal: Will verbalize positive feelings about self ?Outcome: Progressing ?  ?

## 2021-09-24 NOTE — Progress Notes (Signed)
eLink Physician-Brief Progress Note Patient Name: Kent Riendeau DOB: 04/30/1961 MRN: [id number]   Date of Service  09/24/2021  HPI/Events of Note  Patient has copious liquid stools.  eICU Interventions  Flexiseal ordered for perineal skin protection.        Migdalia Dk 09/24/2021, 8:27 PM

## 2021-09-24 NOTE — Progress Notes (Addendum)
Sodium is 162, above goal. Will give small free water replacement per tube, q8h.   Ritta Slot, MD [facility] [phone]  If 7pm- 7am, please page neurology on call as listed in AMION.

## 2021-09-24 NOTE — Progress Notes (Signed)
Nutrition Follow-up  DOCUMENTATION CODES:   Obesity unspecified  INTERVENTION:   Tube feeding via Cortrak tube: (adjustments) Vital AF 1.2 at 50 ml/h (1200 ml per day) Prosource TF 45 ml TID  Provides 1560 kcal, 123 gm protein, 973 ml free water daily  200 ml free water every 4 hours  Total free water: 2173 ml   NUTRITION DIAGNOSIS:   Inadequate oral intake related to inability to eat as evidenced by NPO status. Ongoing.   GOAL:   Patient will meet greater than or equal to 90% of their needs Met with TF at goal   MONITOR:   Vent status, TF tolerance  REASON FOR ASSESSMENT:   Consult, Ventilator Enteral/tube feeding initiation and management  ASSESSMENT:   Pt with PMH of Afib on coumadin, HTN, HLD, previous smoker, previous CVA, DM, OSA now admitted with R thalamic bleed with intraventricular extension.   Pt discussed during ICU rounds and with RN. Na elevated to 160, 3% off and free water added to decrease sodium to 150 over next 48 hours. Per MD plan to wean to possible extubation over weekend.  Insulin being adjusted.   1/23 s/p cortrak placement; per xray tip in pyloric bulb   Patient is currently intubated on ventilator support MV: 7.8 L/min Temp (24hrs), Avg:99.1 F (37.3 C), Min:98.4 F (36.9 C), Max:100 F (37.8 C)  Propofol: off Medications reviewed and include: SSI, novolog 5 units every 4 hours, levemir 20 units BID, protonix, senokot-s  Labs reviewed: Na 162, TG: 330 A1C: 9.7 (1/23) CBG's: 199-254   UOP: 2245 ml   Current TF:  Vital AF 1.2 at 45 ml/h with Prosource TF 90 ml BID Provides 1456 kcal, 125 gm protein  Diet Order:   Diet Order             Diet NPO time specified  Diet effective now                   EDUCATION NEEDS:   No education needs have been identified at this time  Skin:  Skin Assessment: Reviewed RN Assessment  Last BM:  unknown  Height:   Ht Readings from Last 1 Encounters:  09/20/21 $RemoveB'5\' 4"'JaNAKkIT$  (1.626 m)     Weight:   Wt Readings from Last 1 Encounters:  09/24/21 98 kg    BMI:  Body mass index is 37.09 kg/m.  Estimated Nutritional Needs:   Kcal:  1300-1600  Protein:  118-130 grams  Fluid:  > 1.5 L/day  Lockie Pares., RD, LDN, CNSC See AMiON for contact information

## 2021-09-24 NOTE — Progress Notes (Signed)
Pt's cooling blanket turned off at this time per Dr. Mervyn Skeeters., V.O.

## 2021-09-24 NOTE — Progress Notes (Signed)
Pt's sodium 161, Dr. Mervyn Skeeters informed.

## 2021-09-24 NOTE — Progress Notes (Signed)
Pt's brother Nadine Counts called at 1035 providing password. Family updated on pt's status. This RN attempted to explain the pt's respiratory status (i.e. pt being on CPAP/vent versus having to rely solely on the vent for RR/breathing). I also explained the pt's BP and as needed meds to treat his elevated SBP, along w/ other new orders placed today. Pt's brother that there will be family members visiting today.

## 2021-09-24 NOTE — Progress Notes (Addendum)
STROKE TEAM PROGRESS NOTE   INTERVAL HISTORY Patient is seen in his room with no family at the bedside.  He has been hemodynamically stable overnight and has remained afebrile.  His Na is elevated at 164.  His neurological exam has improved somewhat, although he still cannot follow commands.  As patient has a difficult airway, strategies for extubation vs. Tracheostomy were discussed with CCM.  Vitals:   09/24/21 1118 09/24/21 1119 09/24/21 1148 09/24/21 1200  BP: (!) 168/81 (!) 177/79 (!) 153/72 (!) 147/72  Pulse: 82 82 77 75  Resp: 13 16 14 12   Temp:   99.8 F (37.7 C)   TempSrc:   Axillary   SpO2: 95% 95% 96% 95%  Weight:      Height:       CBC:  Recent Labs  Lab 09/20/21 0908 09/20/21 0913 09/23/21 0556 09/23/21 1612 09/24/21 0517  WBC 9.1   < > 12.7*  --  11.1*  NEUTROABS 6.6  --   --   --   --   HGB 16.2   < > 13.4 10.9* 13.3  HCT 46.1   < > 43.4 32.0* 42.3  MCV 86.7   < > 97.1  --  97.9  PLT 248   < > 227  --  238   < > = values in this interval not displayed.    Basic Metabolic Panel:  Recent Labs  Lab 09/22/21 0523 09/22/21 1114 09/22/21 1710 09/23/21 0020 09/23/21 0556 09/23/21 1052 09/23/21 1612 09/23/21 1732 09/24/21 0517 09/24/21 1135  NA 155*   < > 154*   < > 159*   < > 141   < > 162* 164*  K 3.9  --   --   --  3.8  --  4.4  --  3.8  --   CL 123*  --   --   --  127*  --   --   --  >130*  --   CO2 23  --   --   --  24  --   --   --  24  --   GLUCOSE 218*  --   --   --  108*  --   --   --  238*  --   BUN 23*  --   --   --  26*  --   --   --  31*  --   CREATININE 1.35*  --   --   --  1.20  --   --   --  1.09  --   CALCIUM 9.0  --   --   --  8.9  --   --   --  9.2  --   MG 2.6*  --  2.8*  --   --   --   --   --   --   --   PHOS 3.8  --  2.3*  --   --   --   --   --  3.4  --    < > = values in this interval not displayed.    Lipid Panel:  Recent Labs  Lab 09/21/21 0554 09/23/21 0556  CHOL 129  --   TRIG 242*   263* 330*  HDL 29*  --    CHOLHDL 4.4  --   VLDL 48*  --   LDLCALC 52  --     HgbA1c:  Recent Labs  Lab 09/21/21 0554  HGBA1C 9.7*  Urine Drug Screen:  Recent Labs  Lab 09/21/21 0303  LABOPIA NONE DETECTED  COCAINSCRNUR NONE DETECTED  LABBENZ NONE DETECTED  AMPHETMU NONE DETECTED  THCU NONE DETECTED  LABBARB NONE DETECTED     Alcohol Level No results for input(s): ETH in the last 168 hours.  IMAGING past 24 hours No results found.  PHYSICAL EXAM  Physical Exam  Constitutional: Appears well-developed and well-nourished.  Cardiovascular: Normal rate and regular rhythm.  Respiratory: Respirations synchronous with ventilator  Neuro: Intubated off sedation.  Opens eyes to voice but does not follow commands. PERRL with bilateral corneal reflexes, weaker on the left.  Cough reflex present  Will flicker RUE and RLE to noxious stimuli, LUE and LEE do not move to stimuli.  ASSESSMENT/PLAN Mr. Fenner Probus is a 61 y.o. male with history of AFib on Coumadin s/p recent conversion, HTN, HLD, remote tobacco abuse, Cataract OU, glaucoma, obesity, CVA, DM II, and OSA who initially presented to the ED via EMS left sided weakness, dysarthria, and right fixed gaze. Coumadin reversed. Labetalol and cleviprex used for BP greater than 140 during CT. Patient was then intubated due to somnolence and inability to protect his airway. CT and MRI show stable right thalamic hemorrhage with intraventricular extension. ICH score 2. Febrile. Hypertonic saline discontinued 1/23. Most recent sodium is 155.  Currently in NSR with flecainide continued. Holding home antihypertensive medications currently. Propofol and fentanyl infusing. No longer using cleviprex or norepinephrine (r/t hypotension after RSI) for BP control. PRN IVP vecuronium for vent synchronicity per CCM. Repeat CT shows no significant change in the hemorrhage or ventricle size.  Stroke:  Acute right thalamic ICH and IVH likely secondary to hypertension in the  setting of coumadin use Code stroke CT - Positive for acute right thalamic hemorrhage with intraventricular extension. Estimated intra-axial blood volume 25 mL. Mild Regional brain edema. Trace leftward midline shift. 1/22 Repeat CT shows no changes from code stroke CT MRI  Stable right thalamic hemorrhage since presentation. Regional edema, tracking into the midbrain. 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 No increased intracranial mass effect. MRA  Abundant chronic micro-hemorrhages in the brain superimposed on chronic lacunar infarcts compatible with advanced small vessel disease Carotid Doppler  1-39% stenosis in Left and Right ICA 2D Echo EF 60-65% LDL 52 HgbA1c 9.7 VTE prophylaxis - heparin warfarin daily prior to admission, now on No antithrombotic. ICH Therapy recommendations:  pending Disposition:  pending  Cerebral Edema with trace left midline shift Stable MRI and repeat CT scan Next CT - No significant interval change in the right thalamic hemorrhage with extension into the ventricular system since the prior CT. No change in the ventricular size Hypertonic saline discontinued Free water flushes given 261mL q4h NA- 150-> 154-> 156-> 155 -> 157 -> 164  Atrial Fibrillation Home meds: Flecainide, coreg, coumadin Flecainide continued, patient is in NSR currently  Hypertension Home meds:  Amlodipine, coreg, losartan-hydrochlorothiazide Restart gradually BP goal less than 160 BP labile post intubation, levophed d/c'd  Hyperlipidemia Home meds:  None LDL 52, goal < 70  Diabetes type II Uncontrolled Home meds:  Metformin, jardiance HgbA1c 9.7 , goal < 7.0 CBGs SSI  Other Stroke Risk Factors Cigarette smoker, advised to stop smoking ETOH use, alcohol level No results found for requested labs within last 26280 hours., advised to drink no more than 2 drink(s) a day Obesity, Body mass index is 37.09 kg/m., BMI >/= 30 associated with increased stroke risk,  recommend weight loss, diet and exercise  as appropriate  Obstructive sleep apnea  Other Active Problems Acute hypoxemic respiratory failure CCM consult for intubation and ventilator management Sedation with propofol and fentanyl Monitor triglycerides Difficult intubation r/t tracheal stenosis PRN paralytic for vent management  Hospital day # 4  Patient seen and examined by NP/APP with MD. MD to update note as needed.    [place] , MSN, AGACNP-BC [facility] See Amion for schedule and pager information 09/24/2021 12:58 PM   ATTENDING NOTE: I reviewed above note and agree with the assessment and plan. Pt was seen and examined.   No family at bedside.  Patient still intubated, currently off all sedations.  Patient more awake alert than before, eyes open on voice, however not following commands on my exam, not blinking to visual threat, no doll's eyes, however able to have vertical spontaneous rolling eyes.  Corneal present bilaterally, right stronger than left, positive gag.  On pain stimulation, right arm and leg mild withdrawal, left hemiplegia.  Afebrile today, off cooling blanket, sodium 164, will put on free water.  Continue tube feeding.  Discussed with CCM Dr. Lynetta Mare.  CT repeat in the a.m.  For detailed assessment and plan, please refer to above as I have made changes wherever appropriate.   Rosalin Hawking, MD PhD Stroke Neurology 09/24/2021 6:10 PM     To contact Stroke Continuity provider, please refer to [email or web address] After hours, contact General Neurology

## 2021-09-24 NOTE — Progress Notes (Signed)
SLP Cancellation Note  Patient Details Name: Caleb Vaughan MRN: [id number] DOB: [birth year]   Cancelled treatment:       Reason Eval/Treat Not Completed: Patient not medically ready (Pt remains on the vent at this time. SLP will follow up.)  Jaleen Finch I. Vear Clock, MS, CCC-SLP Acute Rehabilitation Services Office number [phone] Pager [phone]  [name] 09/24/2021, 8:08 AM

## 2021-09-24 NOTE — Progress Notes (Signed)
NAME:  Caleb Vaughan, MRN:  [id number], DOB:  Apr 30, 1961, LOS: 4 ADMISSION DATE:  09/20/2021, CONSULTATION DATE:  09/20/2021 REFERRING MD:  Dr. Carren Rang, CHIEF COMPLAINT:  Hemorrhagic stroke    History of Present Illness:  Caleb Vaughan is a 61 y.o. male with PMH listed below  who presented to the emergency department 1/22 as a code stroke, last known normal 2 AM.   Stroke CT on admit revealed, positive for acute right thalamic hemorrhage with intraventricular extension with estimated intra-axial blood volume 25 mL. Mild Regional brain edema. Trace leftward midline shift. Patient was intubated in ED and PCCM was consulted for further assessment.   Pertinent  Medical History  A-fin on coumadin  HTN HLD Previous smoker  Prior CVA  Diabetes  OSA   Significant Hospital Events: Including procedures, antibiotic start and stop dates in addition to other pertinent events   1/22 Admitted as code stroke found to have right thalamic bleed with trace leftward shift. Intubated in ED for increasing somnolence and inability to protect airway.  1/24 increased respiratory distress requiring increased sedation and intermittent neuromuscular blockade.  Improved with diuresis.  3% saline stopped for increasing sodium above 159  Interim History / Subjective:  Weaned off sedation yesterday and following commands. Started on free water for Na 162.  Fever curve improving.   Objective   Blood pressure (!) 183/75, pulse 86, temperature 98.5 F (36.9 C), temperature source Esophageal, resp. rate (!) 26, height 5\' 4"  (1.626 m), weight 98 kg, SpO2 98 %.    Vent Mode: PSV;CPAP FiO2 (%):  [40 %] 40 % Set Rate:  [16 bmp] 16 bmp Vt Set:  [470 mL] 470 mL PEEP:  [5 cmH20] 5 cmH20 Pressure Support:  [5 cmH20] 5 cmH20 Plateau Pressure:  [9 cmH20-16 cmH20] 14 cmH20   Intake/Output Summary (Last 24 hours) at 09/24/2021 0740 Last data filed at 09/24/2021 0700 Gross per 24 hour  Intake 2105.16 ml  Output 2245  ml  Net -139.84 ml    Filed Weights   09/20/21 0900 09/24/21 0500  Weight: 93 kg 98 kg    Examination: General: Acute on chronically ill appearing middle aged male on mechanical ventilation, in NAD HEENT: ETT, MM pink/moist, PERRL, small bore feeding tube in place Neuro: Does not open his eyes but will follow commands on the right side.  Extensor posturing on the left. CV: s1s2 regular rate and rhythm, no murmur, rubs, or gallops,  PULM:  chest clear, more synchronous with ventilation today GI: soft, bowel sounds active in all 4 quadrants, non-tender, non-distended Extremities: warm/dry, no edema  Skin: no rashes or lesions  Ancillary tests personally reviewed.   CT head shows right thalamic bleed with intraventricular extension. MRI shows no evidence of AVM or other source of bleeding.  Hypernatremia at 162 Creatinine 109 - improving. DM still suboptimally controlled.   Assessment & Plan:   Principal Problem:   Nontraumatic thalamic hemorrhage (HCC) Active Problems:   Respiratory failure with hypoxia (HCC)   Paroxysmal A-fib (HCC)   Obstructive sleep apnea   Type 2 diabetes mellitus (HCC)   Tracheal stenosis   Acute hypernatremia  Plan:   - Start daily SBT - tolerating SBT.  - extubation will likely be complicated by tracheal stenosis. May require tracheostomy. - stopped 3% saline - free water and correct to 150 over next 48h.  Fever and hyperglycemia have contributed to insensitive losses.  - Expect increasing alertness over next 48h as sodium corrects and patient moves to  4-5day post bleed - If remains stable/improves consider trial of extubation at 4-5 day mark. Otherwise proceed with tracheostomy. - Fever has improved on acetaminophen and Buspar.  - Intermittent sedation. keep RASS -1,  - off anticoagulation for at least 1 month. Long-term anticoagulation plan will depend on degree of neurological recovery.   - Currently in sinus rhythm, continue flecainide.  -  maintain SBP 110-160, if , restart home medications as hypertension increasing. Already on amlodipine, will restart carvedilol today.  - increase long-acting insulin   Best Practice (right click and "Reselect all SmartList Selections" daily)   Diet/type: tubefeeds DVT prophylaxis: SCD and heparin [place]  GI prophylaxis: PPI Lines: N/A Foley:  N/A Code Status:  full code Last date of multidisciplinary goals of care discussion: Son updated by phone1/25.  Informed him that edema accompanying initial bleed should start to subside in the next 2 to 3 days and that we should then have a better sense of what his neurological status is.  I did indicate to him that unless he was remarkably better he would likely require a tracheostomy as a bridge to recovery over the next 2 to 3 months.   Performed by: Kipp Brood  Total critical care time: 40 minutes  Critical care time was exclusive of separately billable procedures and treating other patients.  Critical care was necessary to treat or prevent imminent or life-threatening deterioration.  Critical care was time spent personally by me on the following activities: development of treatment plan with patient and/or surrogate as well as nursing, discussions with consultants, evaluation of patient's response to treatment, examination of patient, obtaining history from patient or surrogate, ordering and performing treatments and interventions, ordering and review of laboratory studies, ordering and review of radiographic studies, pulse oximetry and re-evaluation of patient's condition.  Kipp Brood, MD [facility] ICU Physician [place]  Pager: [phone] Or Epic Secure Chat After hours: [phone].  09/24/2021, 7:40 AM     09/24/2021, 7:40 AM

## 2021-09-24 NOTE — Progress Notes (Signed)
Critical lab Na+ 164. Dr. Mervyn Skeeters informed

## 2021-09-25 ENCOUNTER — Inpatient Hospital Stay (HOSPITAL_COMMUNITY): Payer: No Typology Code available for payment source

## 2021-09-25 DIAGNOSIS — J9601 Acute respiratory failure with hypoxia: Secondary | ICD-10-CM

## 2021-09-25 DIAGNOSIS — J690 Pneumonitis due to inhalation of food and vomit: Secondary | ICD-10-CM

## 2021-09-25 DIAGNOSIS — I4821 Permanent atrial fibrillation: Secondary | ICD-10-CM

## 2021-09-25 DIAGNOSIS — I480 Paroxysmal atrial fibrillation: Secondary | ICD-10-CM

## 2021-09-25 DIAGNOSIS — I161 Hypertensive emergency: Secondary | ICD-10-CM

## 2021-09-25 DIAGNOSIS — I619 Nontraumatic intracerebral hemorrhage, unspecified: Secondary | ICD-10-CM | POA: Diagnosis not present

## 2021-09-25 DIAGNOSIS — E870 Hyperosmolality and hypernatremia: Secondary | ICD-10-CM | POA: Diagnosis not present

## 2021-09-25 DIAGNOSIS — N179 Acute kidney failure, unspecified: Secondary | ICD-10-CM | POA: Diagnosis not present

## 2021-09-25 DIAGNOSIS — Z7901 Long term (current) use of anticoagulants: Secondary | ICD-10-CM | POA: Diagnosis not present

## 2021-09-25 LAB — GLUCOSE, CAPILLARY
Glucose-Capillary: 185 mg/dL — ABNORMAL HIGH (ref 70–99)
Glucose-Capillary: 205 mg/dL — ABNORMAL HIGH (ref 70–99)
Glucose-Capillary: 194 mg/dL — ABNORMAL HIGH (ref 70–99)
Glucose-Capillary: 199 mg/dL — ABNORMAL HIGH (ref 70–99)
Glucose-Capillary: 204 mg/dL — ABNORMAL HIGH (ref 70–99)
Glucose-Capillary: 176 mg/dL — ABNORMAL HIGH (ref 70–99)

## 2021-09-25 LAB — CBC
HCT: 41.4 % (ref 39.0–52.0)
Hemoglobin: 13.2 g/dL (ref 13.0–17.0)
MCH: 30.8 pg (ref 26.0–34.0)
MCHC: 31.9 g/dL (ref 30.0–36.0)
MCV: 96.5 fL (ref 80.0–100.0)
Platelets: 234 10*3/uL (ref 150–400)
RBC: 4.29 MIL/uL (ref 4.22–5.81)
RDW: 14.3 % (ref 11.5–15.5)
WBC: 10.5 10*3/uL (ref 4.0–10.5)
nRBC: 0 % (ref 0.0–0.2)

## 2021-09-25 LAB — CULTURE, RESPIRATORY W GRAM STAIN: Culture: NO GROWTH

## 2021-09-25 LAB — SODIUM
Sodium: 158 mmol/L — ABNORMAL HIGH (ref 135–145)
Sodium: 156 mmol/L — ABNORMAL HIGH (ref 135–145)
Sodium: 157 mmol/L — ABNORMAL HIGH (ref 135–145)

## 2021-09-25 LAB — BASIC METABOLIC PANEL
Anion gap: 8 (ref 5–15)
BUN: 32 mg/dL — ABNORMAL HIGH (ref 6–20)
CO2: 22 mmol/L (ref 22–32)
Calcium: 9 mg/dL (ref 8.9–10.3)
Chloride: 127 mmol/L — ABNORMAL HIGH (ref 98–111)
Creatinine, Ser: 1.11 mg/dL (ref 0.61–1.24)
GFR, Estimated: >60 mL/min (ref >60)
Glucose, Bld: 222 mg/dL — ABNORMAL HIGH (ref 70–99)
Potassium: 3.4 mmol/L — ABNORMAL LOW (ref 3.5–5.1)
Sodium: 157 mmol/L — ABNORMAL HIGH (ref 135–145)

## 2021-09-25 MED ORDER — POTASSIUM CHLORIDE 20 MEQ PO PACK
40.0000 meq | PACK | Freq: Once | ORAL | Status: AC
  Administered 2021-09-25: 40 meq
  Filled 2021-09-25: qty 2

## 2021-09-25 MED ORDER — INSULIN ASPART 100 UNIT/ML IJ SOLN
0.0000 [IU] | INTRAMUSCULAR | Status: DC
  Administered 2021-09-25: 4 [IU] via SUBCUTANEOUS
  Administered 2021-09-25: 7 [IU] via SUBCUTANEOUS
  Administered 2021-09-25: 4 [IU] via SUBCUTANEOUS
  Administered 2021-09-26: 7 [IU] via SUBCUTANEOUS
  Administered 2021-09-26: 4 [IU] via SUBCUTANEOUS
  Administered 2021-09-26: 7 [IU] via SUBCUTANEOUS
  Administered 2021-09-26 – 2021-09-27 (×8): 4 [IU] via SUBCUTANEOUS
  Administered 2021-09-27: 7 [IU] via SUBCUTANEOUS
  Administered 2021-09-27 – 2021-09-28 (×2): 4 [IU] via SUBCUTANEOUS
  Administered 2021-09-28 (×2): 3 [IU] via SUBCUTANEOUS
  Administered 2021-09-28: 4 [IU] via SUBCUTANEOUS
  Administered 2021-09-28: 3 [IU] via SUBCUTANEOUS
  Administered 2021-09-29: 4 [IU] via SUBCUTANEOUS
  Administered 2021-09-29 (×2): 3 [IU] via SUBCUTANEOUS
  Administered 2021-09-29: 4 [IU] via SUBCUTANEOUS
  Administered 2021-09-29: 3 [IU] via SUBCUTANEOUS
  Administered 2021-09-30: 4 [IU] via SUBCUTANEOUS
  Administered 2021-09-30: 3 [IU] via SUBCUTANEOUS
  Administered 2021-09-30: 4 [IU] via SUBCUTANEOUS
  Administered 2021-09-30 – 2021-10-01 (×2): 3 [IU] via SUBCUTANEOUS
  Administered 2021-10-01: 4 [IU] via SUBCUTANEOUS
  Administered 2021-10-01 (×2): 3 [IU] via SUBCUTANEOUS
  Administered 2021-10-01: 4 [IU] via SUBCUTANEOUS
  Administered 2021-10-01 – 2021-10-02 (×3): 3 [IU] via SUBCUTANEOUS
  Administered 2021-10-02 (×2): 15 [IU] via SUBCUTANEOUS
  Administered 2021-10-02: 3 [IU] via SUBCUTANEOUS
  Administered 2021-10-03: 11 [IU] via SUBCUTANEOUS
  Administered 2021-10-03: 4 [IU] via SUBCUTANEOUS
  Administered 2021-10-03: 7 [IU] via SUBCUTANEOUS
  Administered 2021-10-03: 4 [IU] via SUBCUTANEOUS
  Administered 2021-10-03: 7 [IU] via SUBCUTANEOUS
  Administered 2021-10-04 (×2): 3 [IU] via SUBCUTANEOUS
  Administered 2021-10-04 – 2021-10-05 (×5): 4 [IU] via SUBCUTANEOUS
  Administered 2021-10-05 (×2): 3 [IU] via SUBCUTANEOUS
  Administered 2021-10-05: 4 [IU] via SUBCUTANEOUS
  Administered 2021-10-06 (×2): 3 [IU] via SUBCUTANEOUS
  Administered 2021-10-06 – 2021-10-07 (×3): 4 [IU] via SUBCUTANEOUS
  Administered 2021-10-07 (×4): 3 [IU] via SUBCUTANEOUS
  Administered 2021-10-07: 7 [IU] via SUBCUTANEOUS
  Administered 2021-10-08 (×2): 4 [IU] via SUBCUTANEOUS
  Administered 2021-10-08: 3 [IU] via SUBCUTANEOUS
  Administered 2021-10-08: 4 [IU] via SUBCUTANEOUS
  Administered 2021-10-09 (×2): 3 [IU] via SUBCUTANEOUS
  Administered 2021-10-09: 4 [IU] via SUBCUTANEOUS
  Administered 2021-10-09 – 2021-10-10 (×2): 3 [IU] via SUBCUTANEOUS
  Administered 2021-10-10 – 2021-10-11 (×4): 4 [IU] via SUBCUTANEOUS
  Administered 2021-10-11: 7 [IU] via SUBCUTANEOUS
  Administered 2021-10-11 (×2): 3 [IU] via SUBCUTANEOUS
  Administered 2021-10-11: 7 [IU] via SUBCUTANEOUS
  Administered 2021-10-12 (×2): 3 [IU] via SUBCUTANEOUS
  Administered 2021-10-12: 4 [IU] via SUBCUTANEOUS
  Administered 2021-10-12 – 2021-10-13 (×2): 7 [IU] via SUBCUTANEOUS
  Administered 2021-10-13: 11 [IU] via SUBCUTANEOUS
  Administered 2021-10-13 (×3): 7 [IU] via SUBCUTANEOUS
  Administered 2021-10-13: 3 [IU] via SUBCUTANEOUS
  Administered 2021-10-14: 4 [IU] via SUBCUTANEOUS
  Administered 2021-10-14: 11 [IU] via SUBCUTANEOUS
  Administered 2021-10-14: 4 [IU] via SUBCUTANEOUS
  Administered 2021-10-14: 11 [IU] via SUBCUTANEOUS
  Administered 2021-10-15: 7 [IU] via SUBCUTANEOUS
  Administered 2021-10-15: 3 [IU] via SUBCUTANEOUS
  Administered 2021-10-15: 4 [IU] via SUBCUTANEOUS
  Administered 2021-10-15 – 2021-10-16 (×5): 7 [IU] via SUBCUTANEOUS
  Administered 2021-10-16: 4 [IU] via SUBCUTANEOUS
  Administered 2021-10-16: 7 [IU] via SUBCUTANEOUS

## 2021-09-25 MED ORDER — SENNOSIDES-DOCUSATE SODIUM 8.6-50 MG PO TABS: 1.0000 | ORAL_TABLET | Freq: Every evening | ORAL | Status: DC | PRN

## 2021-09-25 MED ORDER — LABETALOL HCL 5 MG/ML IV SOLN
20.0000 mg | INTRAVENOUS | Status: DC | PRN
  Administered 2021-09-25 – 2021-10-12 (×22): 20 mg via INTRAVENOUS
  Filled 2021-09-25 (×22): qty 4

## 2021-09-25 MED ORDER — ACETAMINOPHEN 325 MG PO TABS
650.0000 mg | ORAL_TABLET | Freq: Four times a day (QID) | ORAL | Status: DC | PRN
  Administered 2021-09-25 – 2021-10-16 (×19): 650 mg
  Filled 2021-09-25 (×20): qty 2

## 2021-09-25 MED ORDER — FREE WATER
400.0000 mL | Status: DC
  Administered 2021-09-25 – 2021-09-29 (×31): 400 mL

## 2021-09-25 MED ORDER — HYDRALAZINE HCL 20 MG/ML IJ SOLN
20.0000 mg | INTRAMUSCULAR | Status: DC | PRN
  Administered 2021-09-25 – 2021-10-03 (×8): 20 mg via INTRAVENOUS
  Filled 2021-09-25 (×8): qty 1

## 2021-09-25 MED ORDER — HYDRALAZINE HCL 25 MG PO TABS
25.0000 mg | ORAL_TABLET | Freq: Three times a day (TID) | ORAL | Status: DC
  Administered 2021-09-25 – 2021-09-28 (×10): 25 mg
  Filled 2021-09-25 (×10): qty 1

## 2021-09-25 NOTE — Progress Notes (Signed)
[facility] ADULT ICU REPLACEMENT PROTOCOL   The patient does apply for the [facility] Adult ICU Electrolyte Replacment Protocol based on the criteria listed below:   1.Exclusion criteria: TCTS patients, ECMO patients, and Dialysis patients 2. Is GFR >/= 30 ml/min? Yes.    Patient's GFR today is >60 3. Is SCr </= 2? Yes.   Patient's SCr is 1.1 mg/dL 4. Did SCr increase >/= 0.5 in 24 hours? No. 5.Pt's weight >40kg  Yes.   6. Abnormal electrolyte(s): K 3.4 7. Electrolytes replaced per protocol   Ardelle Park 09/25/2021 5:42 AM

## 2021-09-25 NOTE — Progress Notes (Signed)
RT NOTE:  Pt transported to CT without event.  

## 2021-09-25 NOTE — Progress Notes (Signed)
SLP Cancellation Note  Patient Details Name: Caleb Vaughan MRN: [id number] DOB: 12-Jul-1961   Cancelled treatment:       Reason Eval/Treat Not Completed:  (SLP has been following pt since 1/22, but has been unable to complete speech-language evaluation due to pt being on the vent. SLP will sign off at this time. Please re-consult as clinically indicated.)  Tenecia Ignasiak I. Vear Clock, MS, CCC-SLP Acute Rehabilitation Services Office number [phone] Pager [phone]  [name] 09/25/2021, 8:26 AM

## 2021-09-25 NOTE — Progress Notes (Addendum)
NAME:  Caleb Vaughan, MRN:  [id number], DOB:  [birth year], LOS: 5 ADMISSION DATE:  09/20/2021, CONSULTATION DATE:  09/20/2021 REFERRING MD:  Dr. Oretha Milch, CHIEF COMPLAINT:  Hemorrhagic stroke    History of Present Illness:  Caleb Vaughan is a 61 y.o. male with PMH listed below  who presented to the emergency department 1/22 as a code stroke, last known normal 2 AM.   Stroke CT on admit revealed, positive for acute right thalamic hemorrhage with intraventricular extension with estimated intra-axial blood volume 25 mL. Mild Regional brain edema. Trace leftward midline shift. Patient was intubated in ED and PCCM was consulted for further assessment.   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 Intubated in ED for increasing somnolence and inability to protect airway.  1/24 increased respiratory distress requiring increased sedation and intermittent neuromuscular blockade.  Improved with diuresis.  3% saline stopped for increasing sodium above 159  Interim History / Subjective:  No overnight events. Fever improving. Had BM.  Tmax 100.5 2.4L UOP, 700 stool, +1.2L admit  Unable to obtain subjective evaluation due to patient status  Objective   Blood pressure (!) 183/95, pulse 79, temperature 98.2 F (36.8 C), resp. rate 14, height 5\' 4"  (1.626 m), weight 95 kg, SpO2 94 %.    Vent Mode: PSV;CPAP FiO2 (%):  [30 %-40 %] 30 % Set Rate:  [16 bmp] 16 bmp Vt Set:  [470 mL] 470 mL PEEP:  [5 cmH20] 5 cmH20 Pressure Support:  [5 cmH20] 5 cmH20 Plateau Pressure:  [16 cmH20-18 cmH20] 16 cmH20   Intake/Output Summary (Last 24 hours) at 09/25/2021 0858 Last data filed at 09/25/2021 0743 Gross  per 24 hour  Intake 3472.65 ml  Output 2875 ml  Net 597.65 ml   Filed Weights   09/20/21 0900 09/24/21 0500 09/25/21 0500  Weight: 93 kg 98 kg 95 kg    Examination: General: In bed, NAD, appears comfortable HEENT: MM pink/moist, anicteric, atraumatic Neuro: RASS -1, PERRL 12mm, follows commands on right, scant movement on left CV: S1S2, NSR, no m/r/g appreciated PULM:  air movement in all lobes, trachea midline, chest expansion symmetric, weaning on ventilator GI: soft, bsx4 active, non-tender , flexi seal in place.  Extremities: warm/dry, no pretibial edema, capillary refill less than 3 seconds  Skin:  no rashes or lesions noted  Ancillary tests personally reviewed.   BC staph epi BG 155-243 WBC 11.1>10.5 BMP 161>157 K 3.4 Bun 22 Creat 1.09>1.11 CT head: No progression r thalamocapsular hematoma with intraventricular extension. No hydrocephalus, or mass effect   Assessment & Plan:  Nontraumatic thalamic hemorrhage Neuro suspects secondary to HTN in the setting of coumadin use. CT head: No progression r thalamocapsular hematoma with intraventricular extension. No hydrocephalus, or mass effect. - Management per neurology - maintain SBP 110-160mg  - Acetaminophen PRN for fever -Continue neuroprotective measures- normothermia, euglycemia, HOB greater than 30, head in neutral alignment, normocapnia, normoxia.  -Frequent neuro checks per unit protocol  Acute respiratory failure with hypoxia OSA Tracheal stenosis Extubation will likely be complicated by tracheal stenosis. May require tracheostomy. Weaning. -LTVV strategy with tidal volumes of 4-8 cc/kg ideal body weight -Goal plateau pressures less than 30 and driving pressures less than  15 -Wean PEEP/FiO2 for SpO2 92-98% -VAP bundle -Daily SAT and SBT. Eval for extubation on 1/28. -PAD bundle with fentanyl gtt. Currently paused -RASS goal 0 to -1 -Follow intermittent CXR and ABG PRN -Check for cuff leak, if not present  will start steroids  Paroxysmal afib HX HTN, HLD Currently in NSR -Continue flecanide  -Start 25mg  hydral q8h. On amlodipine 10mg  daily and coreg BID. -SBP goal as above -Off anticoagulation for at least 1 month. Long-term anticoagulation plan will depend on degree of neurological recovery.   -Goal K above 4, goal MG above 2. K given  Acute hypernatremia-iatrogenic (medication induced) Hypokelemia 3% stopped on 1/25-26 -Continue free h2o, q4h> increased to 08-17-1974 q3h -q6h Na checks. Monitor drift. Goal 150 over next 48 hours. -Goal K above 4, goal MG above 2. K given -Repeat K level in AM  DMII BG 155-243 -Blood Glucose goal 140-180. -SSI, changed to resistant -long acting increased on 1/27. Control improving. Monitor.  Best Practice (right click and "Reselect all SmartList Selections" daily)   Diet/type: tubefeeds DVT prophylaxis: SCD and heparin [place]  GI prophylaxis: PPI Lines: N/A Foley:  N/A Code Status:  full code Last date of multidisciplinary goals of care discussion: Son updated by phone1/25.  Informed him that edema accompanying initial bleed should start to subside in the next 2 to 3 days and that we should then have a better sense of what his neurological status is.  I did indicate to him that unless he was remarkably better he would likely require a tracheostomy as a bridge to recovery over the next 2 to 3 months.  Critical care time: 36 minutes  ., MSN, APRN, AGACNP-BC [facility]  09/25/2021 , 8:58 AM  Please see [email or web address] for pager details  If no response, please call [phone] After hours, please call Elink at [phone]

## 2021-09-25 NOTE — Progress Notes (Addendum)
STROKE TEAM PROGRESS NOTE   INTERVAL HISTORY Patient is seen in his room with no family at the bedside.  He has been hemodynamically stable overnight and has remained afebrile. FW increased by CCM. Plan to assess for extubation tomorrow. Patient is lethargic today, but able to follow simple commands on the right side with a lot of stimulation.   Vitals:   09/25/21 1125 09/25/21 1126 09/25/21 1200 09/25/21 1300  BP: (!) 167/73  (!) 155/60 (!) 162/70  Pulse: 87  92 93  Resp: (!) 22  (!) 25 (!) 21  Temp: (!) 94.1 F (34.5 C)  99.3 F (37.4 C) 99.5 F (37.5 C)  TempSrc:      SpO2: 93% 93% 92% (!) 89%  Weight:      Height:       CBC:  Recent Labs  Lab 09/20/21 0908 09/20/21 0913 09/24/21 0517 09/25/21 0500  WBC 9.1   < > 11.1* 10.5  NEUTROABS 6.6  --   --   --   HGB 16.2   < > 13.3 13.2  HCT 46.1   < > 42.3 41.4  MCV 86.7   < > 97.9 96.5  PLT 248   < > 238 234   < > = values in this interval not displayed.    Basic Metabolic Panel:  Recent Labs  Lab 09/22/21 0523 09/22/21 1114 09/22/21 1710 09/23/21 0020 09/24/21 0517 09/24/21 1135 09/25/21 0500 09/25/21 1131  NA 155*   < > 154*   < > 162*   < > 157* 158*  K 3.9  --   --    < > 3.8  --  3.4*  --   CL 123*  --   --    < > >130*  --  127*  --   CO2 23  --   --    < > 24  --  22  --   GLUCOSE 218*  --   --    < > 238*  --  222*  --   BUN 23*  --   --    < > 31*  --  32*  --   CREATININE 1.35*  --   --    < > 1.09  --  1.11  --   CALCIUM 9.0  --   --    < > 9.2  --  9.0  --   MG 2.6*  --  2.8*  --   --   --   --   --   PHOS 3.8  --  2.3*  --  3.4  --   --   --    < > = values in this interval not displayed.    Lipid Panel:  Recent Labs  Lab 09/21/21 0554 09/23/21 0556  CHOL 129  --   TRIG 242*   263* 330*  HDL 29*  --   CHOLHDL 4.4  --   VLDL 48*  --   LDLCALC 52  --     HgbA1c:  Recent Labs  Lab 09/21/21 0554  HGBA1C 9.7*    Urine Drug Screen:  Recent Labs  Lab 09/21/21 0303  LABOPIA NONE  DETECTED  COCAINSCRNUR NONE DETECTED  LABBENZ NONE DETECTED  AMPHETMU NONE DETECTED  THCU NONE DETECTED  LABBARB NONE DETECTED     Alcohol Level No results for input(s): ETH in the last 168 hours.  IMAGING past 24 hours CT HEAD WO CONTRAST (5MM)  Result Date:  09/25/2021 CLINICAL DATA:  Stroke follow-up EXAM: CT HEAD WITHOUT CONTRAST TECHNIQUE: Contiguous axial images were obtained from the base of the skull through the vertex without intravenous contrast. RADIATION DOSE REDUCTION: This exam was performed according to the departmental dose-optimization program which includes automated exposure control, adjustment of the mA and/or kV according to patient size and/or use of iterative reconstruction technique. COMPARISON:  Three days ago FINDINGS: Brain: Parenchymal hemorrhage centered at the right thalamus and internal capsule with intraventricular extension into the right more than left lateral ventricles. The hematoma measures up to 3.3 cm in diameter, non progressed. No interval infarct or hydrocephalus. Small remote infarcts in the left thalamus Vascular: No hyperdense vessel or unexpected calcification. Skull: Normal. Negative for fracture or focal lesion. Sinuses/Orbits: No acute finding. IMPRESSION: No progression of the right thalamocapsular hematoma with intraventricular extension. No hydrocephalus or worrisome mass effect. Electronically Signed   By: Jorje Guild M.D.   On: 09/25/2021 04:01    PHYSICAL EXAM  Physical Exam  Constitutional: Appears well-developed and well-nourished.  Cardiovascular: Normal rate and regular rhythm.  Respiratory: Respirations synchronous with ventilator  Neuro: Intubated off sedation.  Opens eyes to voice and follows commands on the right side. Able to wiggle toes to commands, hold up two fingers, and hand grasp and relax to command. PERRL with bilateral corneal reflexes, weaker on the left.  Cough reflex present  Will move RLE to noxious stimuli and RUE  purposefully, LUE and LEE do not move to stimuli.  ASSESSMENT/PLAN Caleb Vaughan is a 61 y.o. male with history of AFib on Coumadin s/p recent conversion, HTN, HLD, remote tobacco abuse, Cataract OU, glaucoma, obesity, CVA, DM II, and OSA who initially presented to the ED via EMS left sided weakness, dysarthria, and right fixed gaze. Coumadin reversed. Labetalol and cleviprex used for BP greater than 140 during CT. Patient was then intubated due to somnolence and inability to protect his airway. CT and MRI show stable right thalamic hemorrhage with intraventricular extension. ICH score 2. Febrile. Hypertonic saline discontinued 1/23. Most recent sodium is 155.  Currently in NSR with flecainide continued. Holding home antihypertensive medications currently. Propofol and fentanyl infusing. No longer using cleviprex or norepinephrine (r/t hypotension after RSI) for BP control. PRN IVP vecuronium for vent synchronicity per CCM. Repeat CT shows no significant change in the hemorrhage or ventricle size.  Stroke:  Acute right thalamic ICH and IVH likely secondary to hypertension in the setting of coumadin use Code stroke CT - Positive for acute right thalamic hemorrhage with intraventricular extension. Estimated intra-axial blood volume 25 mL. Mild Regional brain edema. Trace leftward midline shift. 1/22 Repeat CT shows no changes from code stroke CT MRI  Stable right thalamic hemorrhage since presentation. Regional edema, tracking into the midbrain. 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 No increased intracranial mass effect. MRA  Abundant chronic micro-hemorrhages in the brain superimposed on chronic lacunar infarcts compatible with advanced small vessel disease CT head 1/27 - unchanged hematoma and ventricular size Carotid Doppler  1-39% stenosis in Left and Right ICA 2D Echo EF 60-65% LDL 52 HgbA1c 9.7 VTE prophylaxis - heparin warfarin daily prior to admission, now on No  antithrombotic. ICH Therapy recommendations:  pending Disposition:  pending  Cerebral Edema with trace left midline shift Stable MRI and repeat CT scan Hypertonic saline discontinued Free water flushes given 200cc->400cc q4h NA- 150-> 154-> 156-> 155 -> 157 -> 164->158  Atrial Fibrillation Home meds: Flecainide, coreg, coumadin Flecainide continued, patient is  in NSR currently  Fever ? Central fever Blood culture 1/2 staph epidermidis Repeat blood culture pending  Hypertension Home meds:  Amlodipine, coreg, losartan-hydrochlorothiazide Restart gradually BP goal less than 160 BP labile post intubation, levophed d/c'd  Hyperlipidemia Home meds: Lipitor 80 LDL 52, goal < 70 Consider to resume Lipitor on discharge  Diabetes type II Uncontrolled Home meds:  Metformin, jardiance HgbA1c 9.7 , goal < 7.0 CBGs SSI Close PCP follow-up as outpatient for better DM control  Other Stroke Risk Factors Cigarette smoker, advised to stop smoking ETOH use, advised to drink no more than 2 drink(s) a day Obesity, Body mass index is 35.95 kg/m., BMI >/= 30 associated with increased stroke risk, recommend weight loss, diet and exercise as appropriate  Obstructive sleep apnea  Other Active Problems Acute hypoxemic respiratory failure CCM consult for intubation and ventilator management Sedation with propofol and fentanyl Monitor triglycerides Difficult intubation r/t tracheal stenosis PRN paralytic for vent management  Hospital day # 5   Patient seen and examined by NP/APP with MD. MD to update note as needed.   Janine Ores, DNP, FNP-BC [facility] Pager: [phone]  ATTENDING NOTE: I reviewed above note and agree with the assessment and plan. Pt was seen and examined.   No family at the bedside.  Patient still intubated, lying bed, eyes closed, did not open on voice, however with forced opening, seems able to track around right visual field.  Able to blink to  visual threat on the right, however not blinking to threat on the left.  PERRL.  Left upper and lower extremity flaccid, however right upper extremity proximal 0/5, biceps 3/5 in finger grip 2+/5.  Able to follow commands on the right fingers and right toe.  Right lower extremity withdraw to pain.  Overnight, patient still has low-grade fever 100.5 T-max, blood culture showed 1 out of 2 staph epidermidis, will repeat blood culture x2.  On free water and tube feeding, sodium 158 this morning.  Repeat CT in a.m. showed stable hematoma and ventricular size.  Patient mental status improving, off sedation, CCM on board for vent management, plan for trial of extubation during the weekend.  For detailed assessment and plan, please refer to above as I have made changes wherever appropriate.   Rosalin Hawking, MD PhD Stroke Neurology 09/25/2021 4:17 PM  This patient is critically ill due to [place], IVH, PAF on Coumadin, cerebral edema, respiratory failure, fever and at significant risk of neurological worsening, death form hematoma expansion, brain herniation, obstructive hydrocephalus, stroke, heart failure, seizure, sepsis. This patient's care requires constant monitoring of vital signs, hemodynamics, respiratory and cardiac monitoring, review of multiple databases, neurological assessment, discussion with family, other specialists and medical decision making of high complexity. I spent 40 minutes of neurocritical care time in the care of this patient.    To contact Stroke Continuity provider, please refer to [email or web address] After hours, contact General Neurology

## 2021-09-26 ENCOUNTER — Inpatient Hospital Stay (HOSPITAL_COMMUNITY): Payer: No Typology Code available for payment source

## 2021-09-26 DIAGNOSIS — E870 Hyperosmolality and hypernatremia: Secondary | ICD-10-CM | POA: Diagnosis not present

## 2021-09-26 DIAGNOSIS — I4821 Permanent atrial fibrillation: Secondary | ICD-10-CM | POA: Diagnosis not present

## 2021-09-26 DIAGNOSIS — I619 Nontraumatic intracerebral hemorrhage, unspecified: Secondary | ICD-10-CM | POA: Diagnosis not present

## 2021-09-26 DIAGNOSIS — J9601 Acute respiratory failure with hypoxia: Secondary | ICD-10-CM | POA: Diagnosis not present

## 2021-09-26 LAB — CBC
HCT: 39.6 % (ref 39.0–52.0)
Hemoglobin: 13 g/dL (ref 13.0–17.0)
MCH: 30.9 pg (ref 26.0–34.0)
MCHC: 32.8 g/dL (ref 30.0–36.0)
MCV: 94.1 fL (ref 80.0–100.0)
Platelets: 233 10*3/uL (ref 150–400)
RBC: 4.21 MIL/uL — ABNORMAL LOW (ref 4.22–5.81)
RDW: 14.2 % (ref 11.5–15.5)
WBC: 12.6 10*3/uL — ABNORMAL HIGH (ref 4.0–10.5)
nRBC: 0 % (ref 0.0–0.2)

## 2021-09-26 LAB — PHOSPHORUS: Phosphorus: 3.4 mg/dL (ref 2.5–4.6)

## 2021-09-26 LAB — SODIUM
Sodium: 146 mmol/L — ABNORMAL HIGH (ref 135–145)
Sodium: 151 mmol/L — ABNORMAL HIGH (ref 135–145)
Sodium: 149 mmol/L — ABNORMAL HIGH (ref 135–145)

## 2021-09-26 LAB — BASIC METABOLIC PANEL
Anion gap: 7 (ref 5–15)
BUN: 33 mg/dL — ABNORMAL HIGH (ref 6–20)
CO2: 22 mmol/L (ref 22–32)
Calcium: 9.2 mg/dL (ref 8.9–10.3)
Chloride: 125 mmol/L — ABNORMAL HIGH (ref 98–111)
Creatinine, Ser: 1.09 mg/dL (ref 0.61–1.24)
GFR, Estimated: >60 mL/min (ref >60)
Glucose, Bld: 199 mg/dL — ABNORMAL HIGH (ref 70–99)
Potassium: 3.4 mmol/L — ABNORMAL LOW (ref 3.5–5.1)
Sodium: 154 mmol/L — ABNORMAL HIGH (ref 135–145)

## 2021-09-26 LAB — GLUCOSE, CAPILLARY
Glucose-Capillary: 216 mg/dL — ABNORMAL HIGH (ref 70–99)
Glucose-Capillary: 198 mg/dL — ABNORMAL HIGH (ref 70–99)
Glucose-Capillary: 187 mg/dL — ABNORMAL HIGH (ref 70–99)
Glucose-Capillary: 188 mg/dL — ABNORMAL HIGH (ref 70–99)
Glucose-Capillary: 219 mg/dL — ABNORMAL HIGH (ref 70–99)
Glucose-Capillary: 192 mg/dL — ABNORMAL HIGH (ref 70–99)

## 2021-09-26 LAB — MAGNESIUM: Magnesium: 2.7 mg/dL — ABNORMAL HIGH (ref 1.7–2.4)

## 2021-09-26 MED ORDER — FUROSEMIDE 10 MG/ML IJ SOLN
40.0000 mg | Freq: Once | INTRAMUSCULAR | Status: AC
  Administered 2021-09-26: 40 mg via INTRAVENOUS
  Filled 2021-09-26: qty 4

## 2021-09-26 MED ORDER — FENTANYL CITRATE PF 50 MCG/ML IJ SOSY
25.0000 ug | PREFILLED_SYRINGE | INTRAMUSCULAR | Status: AC | PRN
  Administered 2021-09-27 – 2021-09-28 (×2): 25 ug via INTRAVENOUS
  Filled 2021-09-26 (×2): qty 1

## 2021-09-26 MED ORDER — METOPROLOL TARTRATE 5 MG/5ML IV SOLN
2.5000 mg | INTRAVENOUS | Status: DC | PRN
  Administered 2021-09-27: 5 mg via INTRAVENOUS
  Filled 2021-09-26: qty 5

## 2021-09-26 MED ORDER — INSULIN DETEMIR 100 UNIT/ML ~~LOC~~ SOLN
22.0000 [IU] | Freq: Two times a day (BID) | SUBCUTANEOUS | Status: DC
  Administered 2021-09-26 – 2021-09-28 (×4): 22 [IU] via SUBCUTANEOUS
  Filled 2021-09-26 (×5): qty 0.22

## 2021-09-26 MED ORDER — POTASSIUM CHLORIDE 10 MEQ/50ML IV SOLN
10.0000 meq | INTRAVENOUS | Status: AC
  Administered 2021-09-26 (×4): 10 meq via INTRAVENOUS
  Filled 2021-09-26 (×4): qty 50

## 2021-09-26 NOTE — Progress Notes (Signed)
eLink Physician-Brief Progress Note Patient Name: Caleb Vaughan DOB: [birth year] MRN: [id number]   Date of Service  09/26/2021  HPI/Events of Note  Notified of intermittent vent desynchrony off sedation  eICU Interventions  Ordered prn Fentanyl pushes     Intervention Category Minor Interventions: Agitation / anxiety - evaluation and management  Darl Pikes 09/26/2021, 5:18 AM

## 2021-09-26 NOTE — Progress Notes (Addendum)
STROKE TEAM PROGRESS NOTE   INTERVAL HISTORY Patient is seen in his room with no family at the bedside.  Patient is lethargic today, but able to follow simple commands on the right side. When asked he does report pain.  He is on a wean.   Vitals:   09/26/21 0730 09/26/21 0737 09/26/21 0800 09/26/21 0815  BP: (!) 181/71 (!) 166/64 (!) 172/63 (!) 149/63  Pulse: 90 94 99 93  Resp: (!) 32 (!) 31 (!) 27 (!) 27  Temp: 98.2 F (36.8 C) 98.1 F (36.7 C) 97.7 F (36.5 C) 98.1 F (36.7 C)  TempSrc:   Esophageal   SpO2: (!) 87% 94% 92% 96%  Weight:      Height:       CBC:  Recent Labs  Lab 09/20/21 0908 09/20/21 0913 09/25/21 0500 09/26/21 0429  WBC 9.1   < > 10.5 12.6*  NEUTROABS 6.6  --   --   --   HGB 16.2   < > 13.2 13.0  HCT 46.1   < > 41.4 39.6  MCV 86.7   < > 96.5 94.1  PLT 248   < > 234 233   < > = values in this interval not displayed.    Basic Metabolic Panel:  Recent Labs  Lab 09/22/21 1710 09/23/21 0020 09/24/21 0517 09/24/21 1135 09/25/21 0500 09/25/21 1131 09/25/21 2304 09/26/21 0429  NA 154*   < > 162*   < > 157*   < > 157* 154*  K  --    < > 3.8  --  3.4*  --   --  3.4*  CL  --    < > >130*  --  127*  --   --  125*  CO2  --    < > 24  --  22  --   --  22  GLUCOSE  --    < > 238*  --  222*  --   --  199*  BUN  --    < > 31*  --  32*  --   --  33*  CREATININE  --    < > 1.09  --  1.11  --   --  1.09  CALCIUM  --    < > 9.2  --  9.0  --   --  9.2  MG 2.8*  --   --   --   --   --   --  2.7*  PHOS 2.3*  --  3.4  --   --   --   --  3.4   < > = values in this interval not displayed.    Lipid Panel:  Recent Labs  Lab 09/21/21 0554 09/23/21 0556  CHOL 129  --   TRIG 242*   263* 330*  HDL 29*  --   CHOLHDL 4.4  --   VLDL 48*  --   LDLCALC 52  --     HgbA1c:  Recent Labs  Lab 09/21/21 0554  HGBA1C 9.7*    Urine Drug Screen:  Recent Labs  Lab 09/21/21 0303  LABOPIA NONE DETECTED  COCAINSCRNUR NONE DETECTED  LABBENZ NONE DETECTED   AMPHETMU NONE DETECTED  THCU NONE DETECTED  LABBARB NONE DETECTED     Alcohol Level No results for input(s): ETH in the last 168 hours.  IMAGING past 24 hours No results found.  PHYSICAL EXAM  Physical Exam  Constitutional: Appears well-developed and well-nourished.  Cardiovascular:  Normal rate and regular rhythm.  Respiratory: Respirations synchronous with ventilator  Neuro: Intubated off sedation.  Opens eyes to voice and follows commands on the right side. Able to wiggle toes to commands, hold up two fingers, and hand grasp and relax to command. PERRL with bilateral corneal reflexes, weaker on the left.  Cough reflex present  Will move RLE to noxious stimuli and RUE purposefully, LUE and LEE do not move to stimuli.  ASSESSMENT/PLAN [name] is a 61 y.o. male with history of AFib on Coumadin s/p recent conversion, HTN, HLD, remote tobacco abuse, Cataract OU, glaucoma, obesity, CVA, DM II, and OSA who initially presented to the ED via EMS left sided weakness, dysarthria, and right fixed gaze. Coumadin reversed. Labetalol and cleviprex used for BP greater than 140 during CT. Patient was then intubated due to somnolence and inability to protect his airway. CT and MRI show stable right thalamic hemorrhage with intraventricular extension. ICH score 2. Febrile. Hypertonic saline discontinued 1/23. Most recent sodium is 155.  Currently in NSR with flecainide continued. Holding home antihypertensive medications currently. Propofol and fentanyl infusing. No longer using cleviprex or norepinephrine (r/t hypotension after RSI) for BP control. PRN IVP vecuronium for vent synchronicity per CCM. Repeat CT shows no significant change in the hemorrhage or ventricle size.  Stroke:  Acute right thalamic ICH and IVH likely secondary to hypertension in the setting of coumadin use Code stroke CT - Positive for acute right thalamic hemorrhage with intraventricular extension. Estimated intra-axial  blood volume 25 mL. Mild Regional brain edema. Trace leftward midline shift. 1/22 Repeat CT shows no changes from code stroke CT MRI  Stable right thalamic hemorrhage since presentation. Regional edema, tracking into the midbrain. 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 No increased intracranial mass effect. MRA  Abundant chronic micro-hemorrhages in the brain superimposed on chronic lacunar infarcts compatible with advanced small vessel disease CT head 1/27 - unchanged hematoma and ventricular size Carotid Doppler  1-39% stenosis in Left and Right ICA 2D Echo EF 60-65% LDL 52 HgbA1c 9.7 VTE prophylaxis - heparin warfarin daily prior to admission, now on No antithrombotic. ICH Therapy recommendations:  pending Disposition:  pending  Cerebral Edema with trace left midline shift Stable MRI and repeat CT scan Hypertonic saline discontinued Free water flushes given 200cc->400cc q4h NA- 150-> 154-> 156-> 155 -> 157 -> 164->158-> 154  Atrial Fibrillation Home meds: Flecainide, coreg, coumadin Flecainide continued, patient is in NSR currently  Fever ? Central fever Blood culture 1/2 staph epidermidis Repeat blood culture pending  Hypertension Home meds:  Amlodipine, coreg, losartan-hydrochlorothiazide Restart gradually BP goal less than 160 BP labile post intubation, levophed d/c'd  Hyperlipidemia Home meds: Lipitor 80 LDL 52, goal < 70 Consider to resume Lipitor on discharge  Diabetes type II Uncontrolled Home meds:  Metformin, jardiance HgbA1c 9.7 , goal < 7.0 CBGs SSI Close PCP follow-up as outpatient for better DM control  Other Stroke Risk Factors Cigarette smoker, advised to stop smoking ETOH use, advised to drink no more than 2 drink(s) a day Obesity, Body mass index is 35.95 kg/m., BMI >/= 30 associated with increased stroke risk, recommend weight loss, diet and exercise as appropriate  Obstructive sleep apnea  Other Active Problems Acute  hypoxemic respiratory failure CCM consult for intubation and ventilator management Sedation with propofol and fentanyl Monitor triglycerides Difficult intubation r/t tracheal stenosis Weaning 30% 5/5 Vent dyssynchrony  PRN paralytic and sedation  Extubation vs tracheostomy   Hospital day # 6  Patient seen and examined by NP/APP with MD. MD to update note as needed.   Janine Ores, DNP, FNP-BC [facility] Pager: [phone]  ATTENDING ATTESTATION:  Pt with right thalamic ICH+IVH s/p warfarin reversal. He is off hyertonic saline. Na still elevated. Spiked a fever two nights ago. Repeat blood cx pending. Exam with left hemiplegia.  Hope to extubate today.  Dr. Reeves Forth evaluated pt independently, reviewed imaging, chart, labs. Discussed and formulated plan with the APP. Please see APP note above for details.     This patient is critically ill due to respiratory distress, stroke and at significant risk of neurological worsening, death form heart failure, respiratory failure, recurrent stroke, bleeding from [facility], seizure, sepsis. This patient's care requires constant monitoring of vital signs, hemodynamics, respiratory and cardiac monitoring, review of multiple databases, neurological assessment, discussion with family, other specialists and medical decision making of high complexity. I spent 35 minutes of neurocritical care time in the care of this patient.   Audry Kauzlarich,MD    To contact Stroke Continuity provider, please refer to [email or web address] After hours, contact General Neurology

## 2021-09-26 NOTE — Progress Notes (Addendum)
NAME:  Caleb Vaughan, MRN:  [id number], DOB:  Jun 13, 1961, LOS: 6 ADMISSION DATE:  09/20/2021, CONSULTATION DATE:  09/20/2021 REFERRING MD:  Dr. Carren Rang, CHIEF COMPLAINT:  Hemorrhagic stroke    History of Present Illness:  Caleb Vaughan is a 61 y.o. male with PMH listed below  who presented to the emergency department 1/22 as a code stroke, last known normal 2 AM.   Stroke CT on admit revealed, positive for acute right thalamic hemorrhage with intraventricular extension with estimated intra-axial blood volume 25 mL. Mild Regional brain edema. Trace leftward midline shift. Patient was intubated in ED and PCCM was consulted for further assessment.   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 Intubated in ED for increasing somnolence and inability to protect airway.  1/24 increased respiratory distress requiring increased sedation and intermittent neuromuscular blockade.  Improved with diuresis.  3% saline stopped for increasing sodium above 159  Interim History / Subjective:  No acute events overnight.  Weaned until 5pm yesterday. Lethargic this morning. 2mg  versed at midnight Some mild vent dyssynchrony overnight. ELINK ordered fentanyl, but none was given.  Tmax 100.4 WBC 10 >12  Objective   Blood pressure (!) 149/63, pulse 93, temperature 98.1 F (36.7 C), resp. rate (!) 27, height 5\' 4"  (1.626 m), weight 95 kg, SpO2 96 %.    Vent Mode: PSV;CPAP FiO2 (%):  [30 %] 30 % Set Rate:  [16 bmp] 16 bmp Vt Set:  [470 mL] 470 mL PEEP:  [5 cmH20] 5 cmH20 Pressure Support:  [5 cmH20] 5 cmH20 Plateau Pressure:  [15 cmH20] 15 cmH20   Intake/Output Summary (Last 24 hours) at 09/26/2021  1024 Last data filed at 09/26/2021 0953 Gross per 24 hour  Intake 4340 ml  Output 2895 ml  Net 1445 ml    Filed Weights   09/20/21 0900 09/24/21 0500 09/25/21 0500  Weight: 93 kg 98 kg 95 kg    Examination: General: In bed, NAD, appears comfortable HEENT: MM pink/moist, anicteric, atraumatic Neuro: RASS -1, PERRL 36mm, follows commands on right, scant movement on left CV: S1S2, NSR, no m/r/g appreciated PULM:  air movement in all lobes, trachea midline, chest expansion symmetric, weaning on ventilator GI: soft, bsx4 active, non-tender , flexi seal in place.  Extremities: warm/dry, no pretibial edema, capillary refill less than 3 seconds  Skin:  no rashes or lesions noted  Ancillary tests personally reviewed.    CT head: No progression r thalamocapsular hematoma with intraventricular extension. No hydrocephalus, or mass effect   Assessment & Plan:  Nontraumatic thalamic hemorrhage Neuro suspects secondary to HTN in the setting of coumadin use. CT head: No progression r thalamocapsular hematoma with intraventricular extension. No hydrocephalus, or mass effect. - Management per neurology - maintain SBP 110-160mg  - Acetaminophen PRN for fever - Frequent neuro checks per unit protocol  Acute respiratory failure with hypoxia OSA Tracheal stenosis Extubation will likely be complicated by tracheal stenosis. May require tracheostomy. Weaning. -Hopeful for extubation today initially, but quite lethargic today unfortunately.  -Rest on full support today. Try for extubation tomorrow -VAP bundle. -CXR -Daily SAT and SBT.  -Avoid sedating medications. PRN fentanyl/versed ordered.  -RASS goal 0 to -1  Paroxysmal afib  HX HTN, HLD Currently in NSR -Continue flecanide  -25mg  hydral q8h. On amlodipine 10mg  daily and coreg BID. -SBP goal as above -Off anticoagulation for at least 1 month. Long-term anticoagulation plan will depend on degree of neurological recovery.   -Goal K above 4,  goal MG above 2. 85meq K given  Acute hypernatremia-iatrogenic  Hypokelemia 3% stopped on 1/25-26 -Continue free h2o,  473ml q3h -q6h Na checks. Monitor drift. Goal 150 over next 48 hours. -Goal K above 4, goal MG above 2. 44meq K given  DMII BG 155-243 -Blood Glucose goal 140-180. -SSI, changed to resistant -long acting increased on 1/28. Control improving. Monitor.  Best Practice (right click and "Reselect all SmartList Selections" daily)   Diet/type: tubefeeds DVT prophylaxis: SCD and heparin [place]  GI prophylaxis: PPI Lines: N/A Foley:  N/A Code Status:  full code Last date of multidisciplinary goals of care discussion: Son updated by phone1/25.  Informed him that edema accompanying initial bleed should start to subside in the next 2 to 3 days and that we should then have a better sense of what his neurological status is.  I did indicate to him that unless he was remarkably better he would likely require a tracheostomy as a bridge to recovery over the next 2 to 3 months.  Critical care time: 36 minutes  Georgann Housekeeper, AGACNP-BC [facility]  See Amion for personal pager PCCM on call pager [phone] until 7pm. Please call Elink 7p-7a. [phone]  09/26/2021 10:35 AM   I agree with the Advanced Practitioner's note, impression, and recommendations as outlined. I have taken an independent interval history, reviewed the chart and examined the patient.  My medical decision making is as follows:   Subjective: Less alert this morning, not following commands. Breathing spontaneously  but appears slightly hypoxemic, diaphoretic. Low grade fever this morning  Objective: Vitals:   09/26/21 1105 09/26/21 1200  BP:  138/65  Pulse:  81  Resp: (!) 27 (!) 31  Temp:  98.6 F (37 C)  SpO2: 95% 93%    Gen:      Intubated, sedated, acutely ill appearing, diaphoretic and tachypnic HEENT:  ETT to vent Lungs:    sounds of mechanical ventilation auscultated  breath sounds diminshed on the left CV:         RRR no mrg Abd:      + bowel sounds; soft, non-tender; no palpable masses, no distension Ext:    No edema Skin:      Warm and dry; no rashes Neuro:   left sided hemiparesis, not following commands today   Labs/Imaging: Na 146 down from 154 K 3.4 Cl 125 Glucose 199  Chest xray reviewed this morning shows left sided basilar atelectasis. Otherwise no acute change.    Assessment and Plan:  Hypertensive Intracranial Hemorrhage Acute hypoxemic respiratory failure Question of tracheal stenosis?  Paroxysmal Atrial Fibrillation Type 2 DM  He is more lethargic today and does not appear awake enough for extubation. Will limited sedating medications. He is also a little more hypoxemic and tachypnic with SBT. Will diurese with IV lasix x1 given some secretions. He did have a cuff leak.  Consider precedex for continuous sedation if needing more than prn fentanyl Will re-evaluate tomorrow for extubation.  Increase basal insulin for hyperglycemia   The patient is critically ill due to respiratory failure with multiple organ systems failure and requires high complexity decision making for assessment and support, frequent evaluation and titration of therapies, application  of advanced monitoring technologies and extensive interpretation of multiple databases.   Critical Care Time devoted to patient care services described in this note is 36 minutes. This time reflects time of care of this [place] . This critical care time does not reflect separately billable procedures or procedure time, teaching time and supervisory time of PA/NP/Med student/Med Resident etc but could involve care discussion time.  Spero Geralds [facility] 09/26/2021 1:31 PM  Pager: see AMION  If no response to pager, please call critical care on call (see AMION) until 7pm After 7:00 pm call Elink

## 2021-09-27 ENCOUNTER — Inpatient Hospital Stay (HOSPITAL_COMMUNITY): Payer: No Typology Code available for payment source

## 2021-09-27 DIAGNOSIS — J9601 Acute respiratory failure with hypoxia: Secondary | ICD-10-CM

## 2021-09-27 DIAGNOSIS — Z794 Long term (current) use of insulin: Secondary | ICD-10-CM

## 2021-09-27 DIAGNOSIS — I619 Nontraumatic intracerebral hemorrhage, unspecified: Secondary | ICD-10-CM | POA: Diagnosis not present

## 2021-09-27 DIAGNOSIS — I161 Hypertensive emergency: Secondary | ICD-10-CM | POA: Diagnosis not present

## 2021-09-27 DIAGNOSIS — E1149 Type 2 diabetes mellitus with other diabetic neurological complication: Secondary | ICD-10-CM

## 2021-09-27 DIAGNOSIS — Z978 Presence of other specified devices: Secondary | ICD-10-CM

## 2021-09-27 DIAGNOSIS — J9600 Acute respiratory failure, unspecified whether with hypoxia or hypercapnia: Secondary | ICD-10-CM

## 2021-09-27 LAB — BASIC METABOLIC PANEL
Anion gap: 10 (ref 5–15)
Anion gap: 8 (ref 5–15)
BUN: 37 mg/dL — ABNORMAL HIGH (ref 6–20)
BUN: 35 mg/dL — ABNORMAL HIGH (ref 6–20)
CO2: 21 mmol/L — ABNORMAL LOW (ref 22–32)
CO2: 21 mmol/L — ABNORMAL LOW (ref 22–32)
Calcium: 9.1 mg/dL (ref 8.9–10.3)
Calcium: 8.8 mg/dL — ABNORMAL LOW (ref 8.9–10.3)
Chloride: 117 mmol/L — ABNORMAL HIGH (ref 98–111)
Chloride: 113 mmol/L — ABNORMAL HIGH (ref 98–111)
Creatinine, Ser: 1.07 mg/dL (ref 0.61–1.24)
Creatinine, Ser: 1.11 mg/dL (ref 0.61–1.24)
GFR, Estimated: >60 mL/min (ref >60)
GFR, Estimated: >60 mL/min (ref >60)
Glucose, Bld: 188 mg/dL — ABNORMAL HIGH (ref 70–99)
Glucose, Bld: 199 mg/dL — ABNORMAL HIGH (ref 70–99)
Potassium: 3.5 mmol/L (ref 3.5–5.1)
Potassium: 3.2 mmol/L — ABNORMAL LOW (ref 3.5–5.1)
Sodium: 148 mmol/L — ABNORMAL HIGH (ref 135–145)
Sodium: 142 mmol/L (ref 135–145)

## 2021-09-27 LAB — POCT I-STAT 7, (LYTES, BLD GAS, ICA,H+H)
Acid-base deficit: 1 mmol/L (ref 0.0–2.0)
Bicarbonate: 23 mmol/L (ref 20.0–28.0)
Calcium, Ion: 1.26 mmol/L (ref 1.15–1.40)
HCT: 34 % — ABNORMAL LOW (ref 39.0–52.0)
Hemoglobin: 11.6 g/dL — ABNORMAL LOW (ref 13.0–17.0)
O2 Saturation: 100 %
Patient temperature: 99
Potassium: 3.3 mmol/L — ABNORMAL LOW (ref 3.5–5.1)
Sodium: 147 mmol/L — ABNORMAL HIGH (ref 135–145)
TCO2: 24 mmol/L (ref 22–32)
pCO2 arterial: 35 mmHg (ref 32.0–48.0)
pH, Arterial: 7.427 (ref 7.350–7.450)
pO2, Arterial: 176 mmHg — ABNORMAL HIGH (ref 83.0–108.0)

## 2021-09-27 LAB — CBC WITH DIFFERENTIAL/PLATELET
Abs Immature Granulocytes: 0.04 10*3/uL (ref 0.00–0.07)
Basophils Absolute: 0 10*3/uL (ref 0.0–0.1)
Basophils Relative: 0 %
Eosinophils Absolute: 0.4 10*3/uL (ref 0.0–0.5)
Eosinophils Relative: 4 %
HCT: 38.8 % — ABNORMAL LOW (ref 39.0–52.0)
Hemoglobin: 12.5 g/dL — ABNORMAL LOW (ref 13.0–17.0)
Immature Granulocytes: 0 %
Lymphocytes Relative: 14 %
Lymphs Abs: 1.6 10*3/uL (ref 0.7–4.0)
MCH: 30.6 pg (ref 26.0–34.0)
MCHC: 32.2 g/dL (ref 30.0–36.0)
MCV: 94.9 fL (ref 80.0–100.0)
Monocytes Absolute: 0.9 10*3/uL (ref 0.1–1.0)
Monocytes Relative: 8 %
Neutro Abs: 8.7 10*3/uL — ABNORMAL HIGH (ref 1.7–7.7)
Neutrophils Relative %: 74 %
Platelets: 228 10*3/uL (ref 150–400)
RBC: 4.09 MIL/uL — ABNORMAL LOW (ref 4.22–5.81)
RDW: 13.7 % (ref 11.5–15.5)
WBC: 11.7 10*3/uL — ABNORMAL HIGH (ref 4.0–10.5)
nRBC: 0 % (ref 0.0–0.2)

## 2021-09-27 LAB — SODIUM
Sodium: 148 mmol/L — ABNORMAL HIGH (ref 135–145)
Sodium: 146 mmol/L — ABNORMAL HIGH (ref 135–145)

## 2021-09-27 LAB — GLUCOSE, CAPILLARY
Glucose-Capillary: 161 mg/dL — ABNORMAL HIGH (ref 70–99)
Glucose-Capillary: 161 mg/dL — ABNORMAL HIGH (ref 70–99)
Glucose-Capillary: 163 mg/dL — ABNORMAL HIGH (ref 70–99)
Glucose-Capillary: 180 mg/dL — ABNORMAL HIGH (ref 70–99)
Glucose-Capillary: 195 mg/dL — ABNORMAL HIGH (ref 70–99)
Glucose-Capillary: 219 mg/dL — ABNORMAL HIGH (ref 70–99)

## 2021-09-27 LAB — CULTURE, BLOOD (ROUTINE X 2)
Culture: NO GROWTH
Special Requests: ADEQUATE

## 2021-09-27 MED ORDER — MIDAZOLAM HCL 2 MG/2ML IJ SOLN: 1.0000 mg | Freq: Once | INTRAMUSCULAR | Status: AC

## 2021-09-27 MED ORDER — FUROSEMIDE 10 MG/ML IJ SOLN
40.0000 mg | Freq: Once | INTRAMUSCULAR | Status: AC
Start: 2021-09-27 — End: 2021-09-27
  Administered 2021-09-27: 40 mg via INTRAVENOUS
  Filled 2021-09-27: qty 4

## 2021-09-27 MED ORDER — FENTANYL CITRATE PF 50 MCG/ML IJ SOSY
25.0000 ug | PREFILLED_SYRINGE | INTRAMUSCULAR | Status: DC | PRN
  Administered 2021-09-28: 25 ug via INTRAVENOUS
  Filled 2021-09-27: qty 1

## 2021-09-27 MED ORDER — FENTANYL CITRATE PF 50 MCG/ML IJ SOSY
PREFILLED_SYRINGE | INTRAMUSCULAR | Status: AC
  Administered 2021-09-27: 50 ug via INTRAVENOUS
  Filled 2021-09-27: qty 2

## 2021-09-27 MED ORDER — FENTANYL CITRATE PF 50 MCG/ML IJ SOSY: 50.0000 ug | PREFILLED_SYRINGE | Freq: Once | INTRAMUSCULAR | Status: AC

## 2021-09-27 MED ORDER — FENTANYL CITRATE PF 50 MCG/ML IJ SOSY
25.0000 ug | PREFILLED_SYRINGE | INTRAMUSCULAR | Status: DC | PRN
  Administered 2021-09-28 (×2): 100 ug via INTRAVENOUS
  Administered 2021-09-28: 50 ug via INTRAVENOUS
  Administered 2021-09-28: 100 ug via INTRAVENOUS
  Administered 2021-09-28: 50 ug via INTRAVENOUS
  Administered 2021-09-29: 100 ug via INTRAVENOUS
  Filled 2021-09-27 (×3): qty 2
  Filled 2021-09-27 (×2): qty 1
  Filled 2021-09-27: qty 2

## 2021-09-27 MED ORDER — ETOMIDATE 2 MG/ML IV SOLN
INTRAVENOUS | Status: AC
  Administered 2021-09-27: 20 mg via INTRAVENOUS
  Filled 2021-09-27: qty 10

## 2021-09-27 MED ORDER — ROCURONIUM BROMIDE 50 MG/5ML IV SOLN: 80.0000 mg | Freq: Once | INTRAVENOUS | Status: AC

## 2021-09-27 MED ORDER — ROCURONIUM BROMIDE 10 MG/ML (PF) SYRINGE
PREFILLED_SYRINGE | INTRAVENOUS | Status: AC
  Administered 2021-09-27: 80 mg via INTRAVENOUS
  Filled 2021-09-27: qty 10

## 2021-09-27 MED ORDER — POTASSIUM CHLORIDE 10 MEQ/50ML IV SOLN
10.0000 meq | INTRAVENOUS | Status: AC
  Administered 2021-09-27 – 2021-09-28 (×4): 10 meq via INTRAVENOUS
  Filled 2021-09-27 (×4): qty 50

## 2021-09-27 MED ORDER — POTASSIUM CHLORIDE 20 MEQ PO PACK: 20.0000 meq | PACK | ORAL | Status: DC

## 2021-09-27 MED ORDER — MIDAZOLAM HCL 2 MG/2ML IJ SOLN
INTRAMUSCULAR | Status: AC
  Administered 2021-09-27: 1 mg via INTRAVENOUS
  Filled 2021-09-27: qty 2

## 2021-09-27 MED ORDER — ETOMIDATE 2 MG/ML IV SOLN: 20.0000 mg | Freq: Once | INTRAVENOUS | Status: AC

## 2021-09-27 NOTE — Progress Notes (Addendum)
STROKE TEAM PROGRESS NOTE   INTERVAL HISTORY Patient is seen in his room with no family at the bedside.  Patient is lethargic today, but able to follow simple commands on the right side. When asked he does report pain.  He is on a wean 30%, 5/5. Plan to extubate later today.  Update 0943: Extubated to [place] and then needed BiPAP due to snoring respirations.   Vitals:   09/27/21 0600 09/27/21 0700 09/27/21 0715 09/27/21 0800  BP: 133/63 (!) 143/66  (!) 143/68  Pulse: 75 83 80 80  Resp: 20 (!) 27 (!) 25 (!) 23  Temp: 98.6 F (37 C) 99 F (37.2 C) 99.1 F (37.3 C) 99 F (37.2 C)  TempSrc:      SpO2: 95% 94% 96% 93%  Weight:      Height:       CBC:  Recent Labs  Lab 09/20/21 0908 09/20/21 0913 09/25/21 0500 09/26/21 0429  WBC 9.1   < > 10.5 12.6*  NEUTROABS 6.6  --   --   --   HGB 16.2   < > 13.2 13.0  HCT 46.1   < > 41.4 39.6  MCV 86.7   < > 96.5 94.1  PLT 248   < > 234 233   < > = values in this interval not displayed.    Basic Metabolic Panel:  Recent Labs  Lab 09/22/21 1710 09/23/21 0020 09/24/21 0517 09/24/21 1135 09/25/21 0500 09/25/21 1131 09/26/21 0429 09/26/21 1227 09/26/21 2308 09/27/21 0407  NA 154*   < > 162*   < > 157*   < > 154*   < > 149* 148*  K  --    < > 3.8  --  3.4*  --  3.4*  --   --   --   CL  --    < > >130*  --  127*  --  125*  --   --   --   CO2  --    < > 24  --  22  --  22  --   --   --   GLUCOSE  --    < > 238*  --  222*  --  199*  --   --   --   BUN  --    < > 31*  --  32*  --  33*  --   --   --   CREATININE  --    < > 1.09  --  1.11  --  1.09  --   --   --   CALCIUM  --    < > 9.2  --  9.0  --  9.2  --   --   --   MG 2.8*  --   --   --   --   --  2.7*  --   --   --   PHOS 2.3*  --  3.4  --   --   --  3.4  --   --   --    < > = values in this interval not displayed.    Lipid Panel:  Recent Labs  Lab 09/21/21 0554 09/23/21 0556  CHOL 129  --   TRIG 242*   263* 330*  HDL 29*  --   CHOLHDL 4.4  --   VLDL 48*  --   LDLCALC  52  --     HgbA1c:  Recent Labs  Lab  09/21/21 0554  HGBA1C 9.7*    Urine Drug Screen:  Recent Labs  Lab 09/21/21 0303  LABOPIA NONE DETECTED  COCAINSCRNUR NONE DETECTED  LABBENZ NONE DETECTED  AMPHETMU NONE DETECTED  THCU NONE DETECTED  LABBARB NONE DETECTED     Alcohol Level No results for input(s): ETH in the last 168 hours.  IMAGING past 24 hours DG CHEST PORT 1 VIEW  Result Date: 09/26/2021 CLINICAL DATA:  Follow-up intubated patient. EXAM: PORTABLE CHEST 1 VIEW COMPARISON:  09/22/2021 and earlier studies. FINDINGS: Endotracheal tube tip projects 3 cm above the carina. Enteric tube passes below the diaphragm, tip in the distal stomach. Right PICC tip projects in the lower superior vena cava. Support apparatus is stable. Cardiac silhouette is normal in size. Small area of left lung base opacity, which may reflect atelectasis or infection. Remainder of the lungs is clear. Lungs otherwise clear. No convincing pleural effusion or pneumothorax. IMPRESSION: 1. Small area of left lung base opacity consistent with atelectasis or pneumonia. Remainder of the lungs is clear. 2. Support apparatus is stable and well positioned. Electronically Signed   By: Lajean Manes M.D.   On: 09/26/2021 10:42    PHYSICAL EXAM  Physical Exam  Constitutional: Appears well-developed and well-nourished.  Cardiovascular: Normal rate and regular rhythm.  Respiratory: Respirations synchronous with ventilator  Neuro: Intubated off sedation.  Tends to keep his eyes closed.  Opens eyes to voice but briefly and follows commands on the right side. Able to wiggle toes to commands, hold up two fingers, and hand grasp and relax to command. PERRL with bilateral corneal reflexes, weaker on the left.  Cough reflex present  Will move RLE to noxious stimuli and RUE purposefully, LUE and LEE do not move to stimuli.  ASSESSMENT/PLAN [name] is a 61 y.o. male with history of AFib on Coumadin s/p recent  conversion, HTN, HLD, remote tobacco abuse, Cataract OU, glaucoma, obesity, CVA, DM II, and OSA who initially presented to the ED via EMS left sided weakness, dysarthria, and right fixed gaze. Coumadin reversed. Labetalol and cleviprex used for BP greater than 140 during CT. Patient was then intubated due to somnolence and inability to protect his airway. CT and MRI show stable right thalamic hemorrhage with intraventricular extension. ICH score 2. Febrile. Hypertonic saline discontinued 1/23. Most recent sodium is 155.  Currently in NSR with flecainide continued. Holding home antihypertensive medications currently. Propofol and fentanyl infusing. No longer using cleviprex or norepinephrine (r/t hypotension after RSI) for BP control. PRN IVP vecuronium for vent synchronicity per CCM. Repeat CT shows no significant change in the hemorrhage or ventricle size. Weaning 1/28 and 1/29 at 30% , 5/5. Extubated 1/29 and now on BiPAP.  Stroke:  Acute right thalamic ICH and IVH likely secondary to hypertension in the setting of coumadin use Code stroke CT - Positive for acute right thalamic hemorrhage with intraventricular extension. Estimated intra-axial blood volume 25 mL. Mild Regional brain edema. Trace leftward midline shift. 1/22 Repeat CT shows no changes from code stroke CT MRI  Stable right thalamic hemorrhage since presentation. Regional edema, tracking into the midbrain. 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 No increased intracranial mass effect. MRA  Abundant chronic micro-hemorrhages in the brain superimposed on chronic lacunar infarcts compatible with advanced small vessel disease CT head 1/27 - unchanged hematoma and ventricular size Carotid Doppler  1-39% stenosis in Left and Right ICA 2D Echo EF 60-65% LDL 52 HgbA1c 9.7 VTE prophylaxis - heparin warfarin daily prior  to admission, now on No antithrombotic. ICH Therapy recommendations:  pending Disposition:  pending  Cerebral  Edema with trace left midline shift Stable MRI and repeat CT scan Hypertonic saline discontinued Free water flushes given 200cc->400cc q4h NA- 150-> 154-> 156-> 155 -> 157 -> 164->158-> 154->148  Atrial Fibrillation Home meds: Flecainide, coreg, coumadin Flecainide continued, patient is in NSR currently  Fever ? Central fever Blood culture 1/2 staph epidermidis Repeat blood culture pending  Hypertension Home meds:  Amlodipine, coreg, losartan-hydrochlorothiazide Restart gradually BP goal less than 160 BP labile post intubation, levophed d/c'd  Hyperlipidemia Home meds: Lipitor 80 LDL 52, goal < 70 Consider to resume Lipitor on discharge  Diabetes type II Uncontrolled Home meds:  Metformin, jardiance HgbA1c 9.7 , goal < 7.0 CBGs SSI Close PCP follow-up as outpatient for better DM control  Other Stroke Risk Factors Cigarette smoker, advised to stop smoking ETOH use, advised to drink no more than 2 drink(s) a day Obesity, Body mass index is 35.84 kg/m., BMI >/= 30 associated with increased stroke risk, recommend weight loss, diet and exercise as appropriate  Obstructive sleep apnea  Other Active Problems Acute hypoxemic respiratory failure CCM consult for intubation and ventilator management Sedation with propofol and fentanyl Monitor triglycerides Difficult intubation r/t tracheal stenosis Weaning 30% 5/5 Vent dyssynchrony  PRN paralytic and sedation  Extubation vs tracheostomy   Hospital day # 7   Patient seen and examined by NP/APP with MD. MD to update note as needed.   Janine Ores, DNP, FNP-BC [facility] Pager: [phone]  ATTENDING ATTESTATION:  Pt with right nontraumatic thalamic ICH+IVH s/p warfarin reversal. He is off hyertonic saline. Na trending down.   Exam with left hemiplegia, follows commands.  He is extubated today now on BiPAP.  CCM monitoring blood cultures.  Dr. Reeves Forth evaluated pt independently, reviewed imaging,  chart, labs. Discussed and formulated plan with the APP. Please see APP note above for details.      This patient is critically ill due to respiratory distress, intracranial hemorrhage and at significant risk of neurological worsening, death form heart failure, respiratory failure, recurrent stroke, bleeding from [facility], seizure, sepsis. This patient's care requires constant monitoring of vital signs, hemodynamics, respiratory and cardiac monitoring, review of multiple databases, neurological assessment, discussion with family, other specialists and medical decision making of high complexity. I spent 35 minutes of neurocritical care time in the care of this patient.   Jayli Fogleman,MD    To contact Stroke Continuity provider, please refer to [email or web address] After hours, contact General Neurology

## 2021-09-27 NOTE — Progress Notes (Signed)
NAME:  Caleb Vaughan, MRN:  [id number], DOB:  1961-02-25, LOS: 7 ADMISSION DATE:  09/20/2021, CONSULTATION DATE:  09/20/2021 REFERRING MD:  Dr. Carren Rang, CHIEF COMPLAINT:  Hemorrhagic stroke    History of Present Illness:  Caleb Vaughan is a 61 y.o. male with PMH listed below  who presented to the emergency department 1/22 as a code stroke, last known normal 2 AM.   Stroke CT on admit revealed, positive for acute right thalamic hemorrhage with intraventricular extension with estimated intra-axial blood volume 25 mL. Mild Regional brain edema. Trace leftward midline shift. Patient was intubated in ED and PCCM was consulted for further assessment.   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 Intubated in ED for increasing somnolence and inability to protect airway.  1/24 increased respiratory distress requiring increased sedation and intermittent neuromuscular blockade.  Improved with diuresis.  3% saline stopped for increasing sodium above 159 1/29 Continue to tolerated wean but gets agitated with decreased sedation   Interim History / Subjective:  Some issues with tachypnea and hypertension overnight when sedation lightened.   Objective   Blood pressure 133/63, pulse 75, temperature 98.6 F (37 C), resp. rate (!) 25, height 5\' 4"  (1.626 m), weight 94.7 kg, SpO2 96 %.    Vent Mode: PRVC FiO2 (%):  [30 %] 30 % Set Rate:  [16 bmp] 16 bmp Vt Set:  [470 mL] 470 mL PEEP:  [5 cmH20] 5 cmH20 Plateau Pressure:  [14 cmH20-17 cmH20] 14 cmH20   Intake/Output Summary (Last 24 hours) at 09/27/2021 0717 Last data filed at 09/27/2021 0600 Gross per 24 hour  Intake 4681.2 ml  Output 3775 ml   Net 906.2 ml    Filed Weights   09/24/21 0500 09/25/21 0500 09/27/21 0500  Weight: 98 kg 95 kg 94.7 kg    Examination: General: Acute on chronically ill appearing middle aged male lying in bed on mechanical ventilation, in NAD HEENT: ETT, MM pink/moist, PERRL,  Neuro: Will follow some simple commands on vent but will no open eyes to voice  CV: s1s2 regular rate and rhythm, no murmur, rubs, or gallops,  PULM:  Clear to ascultation, no increased work of breathing, no added breath sounds, tolerating vent well  GI: soft, bowel sounds active in all 4 quadrants, non-tender, non-distended, tolerating TF Extremities: warm/dry, no edema  Skin: no rashes or lesions  Ancillary tests personally reviewed.    CT head: No progression r thalamocapsular hematoma with intraventricular extension. No hydrocephalus, or mass effect   Assessment & Plan:  Nontraumatic thalamic hemorrhage -Neuro suspects secondary to HTN in the setting of coumadin use. CT head: No progression r thalamocapsular hematoma with intraventricular extension. No hydrocephalus, or mass effect. 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 May require tracheostomy. Weaning. P: Continue SBT trails with hope for extubation  Continue ventilator support with lung protective strategies  Wean PEEP and FiO2 for sats greater than 90%.  Head of bed elevated 30 degrees. Plateau pressures less than 30 cm H20.  Follow intermittent chest x-ray and ABG.   SAT/SBT as tolerated, mentation preclude extubation  Ensure adequate pulmonary hygiene  VAP bundle in place  PAD protocol  Paroxysmal afib HX  HTN, HLD -Currently in NSR P: Continuous telemetry  Continue Flecainide  Strict intake and output  Closely monitor renal function and electrolytes  Ensure hemodynamic control; continue Norvasc, Coreg, hydralazine,  SBP goal as above  Optimize electrolytes  Remains off anticoagulation for at leat 1 month resumption per neuro   Acute hypernatremia -iatrogenic  Hypokelemia -3% stopped on 1/25-26 P: Continue free water  Q6 NA checks to monitor for down drift   DMII -BG 155-243 P: Continue SSI  and long acting insulin  CBG goal 140-180  Best Practice (right click and "Reselect all SmartList Selections" daily)   Diet/type: tubefeeds DVT prophylaxis: SCD and heparin [place]  GI prophylaxis: PPI Lines: N/A Foley:  N/A Code Status:  full code Last date of multidisciplinary goals of care discussion: Son updated by phone1/25.  Informed him that edema accompanying initial bleed should start to subside in the next 2 to 3 days and that we should then have a better sense of what his neurological status is.  I did indicate to him that unless he was remarkably better he would likely require a tracheostomy as a bridge to recovery over the next 2 to 3 months.  CRITICAL CARE Performed by: Deidrea Gaetz D. Harris  Total critical care time: 38 minutes  Critical care time was exclusive of separately billable procedures and treating other patients.  Critical care was necessary to treat or prevent imminent or life-threatening deterioration.  Critical care was time spent personally by me on the following activities: development of treatment plan with patient and/or surrogate as well as nursing, discussions with consultants, evaluation of patient's response to treatment, examination of patient, obtaining history from patient or surrogate, ordering and performing treatments and interventions, ordering and review of laboratory studies, ordering and review of radiographic studies, pulse oximetry and  re-evaluation of patient's condition.  Ethan Clayburn D. Kenton Kingfisher, NP-C [facility] Personal contact information can be found on Amion  09/27/2021, 7:50 AM

## 2021-09-27 NOTE — Procedures (Signed)
Intubation Procedure Note  Caleb Vaughan  [id number]  06-16-1961  Date:09/27/21  Time:6:40 PM   Provider Performing:Pete E Tanja Port    Procedure: Intubation (31500)  Indication(s) Respiratory Failure  Consent Unable to obtain consent due to emergent nature of procedure.   Anesthesia Etomidate, Versed, Fentanyl, and Rocuronium   Time Out Verified patient identification, verified procedure, site/side was marked, verified correct patient position, special equipment/implants available, medications/allergies/relevant history reviewed, required imaging and test results available.   Sterile Technique Usual hand hygeine, masks, and gloves were used   Procedure Description Patient positioned in bed supine.  Sedation given as noted above.  Patient was intubated with endotracheal tube using Glidescope.  View was Grade 3 only epiglottis .  Number of attempts was 1.  Colorimetric CO2 detector was consistent with tracheal placement.   Complications/Tolerance None; patient tolerated the procedure well. Chest X-ray is ordered to verify placement.   EBL Minimal   Specimen(s) None  Simonne Martinet ACNP-BC [facility] Pulmonary/Critical Care Pager # [phone] OR # [phone] if no answer

## 2021-09-27 NOTE — Progress Notes (Signed)
Called to bedside. RR mid-30s. Requiring NTG sxn and now w/ epistaxis. Sats 89%.  From mental status stand-point he will localize w/ Right side but not initiating cough.  Given poor mental status, need for frequent airway clearance felt best to proceed w/ re-intubation.  Will most likely need trach  Plan Resume vent support  Cxr PAD protocol   Simonne Martinet ACNP-BC [facility] Pulmonary/Critical Care Pager # [phone] OR # [phone] if no answer

## 2021-09-27 NOTE — Progress Notes (Signed)
eLink Physician-Brief Progress Note Patient Name: Caleb Vaughan DOB: Aug 07, 1961 MRN: [id number]   Date of Service  09/27/2021  HPI/Events of Note  Asked to follow up on ABG post intubation CXR ETT looks ok  eICU Interventions  ABG also looks ok. On PRVC VT 470, RR 16, peep 8, fio2 70. O2 sat 97. Weaning fio2.      Intervention Category Major Interventions: Respiratory failure - evaluation and management  Oretha Milch 09/27/2021, 7:42 PM

## 2021-09-27 NOTE — Progress Notes (Signed)
[facility] ADULT ICU REPLACEMENT PROTOCOL   The patient does apply for the [facility] Adult ICU Electrolyte Replacment Protocol based on the criteria listed below:   1.Exclusion criteria: TCTS patients, ECMO patients, and Dialysis patients 2. Is GFR >/= 30 ml/min? Yes.    Patient's GFR today is > 60 3. Is SCr </= 2? Yes.   Patient's SCr is 1.11 mg/dL 4. Did SCr increase >/= 0.5 in 24 hours? No. 5.Pt's weight >40kg  Yes.   6. Abnormal electrolyte(s): K+ 3.2  7. Electrolytes replaced per protocol 8.  Call MD STAT for K+ </= 2.5, Phos </= 1, or Mag </= 1 Physician:  Dr Marquette Old, Elfredia Nevins 09/27/2021 10:08 PM

## 2021-09-27 NOTE — Procedures (Signed)
Extubation Procedure Note  Patient Details:   Name: Caleb Vaughan DOB: Feb 19, 1961 MRN: [id number]   Airway Documentation:    Vent end date: 09/27/21 Vent end time: 0934   Evaluation  O2 sats: stable throughout Complications: No apparent complications Patient did tolerate procedure well. Bilateral Breath Sounds: Diminished, Rhonchi   No, pt could not speak post extubation.  Pt extubated initally to 4 l/m [place] following check for presence of cuff leak.  Pt had snoring respirations post extubation and BiPAP was started per physician's order.  O2 saturations stable at 93%.  Audrie Lia 09/27/2021, 9:45 AM

## 2021-09-27 NOTE — Progress Notes (Signed)
Patient has PICC and extubated this morning. Patient's RN requested to put one more PIV access then discontinue PICC line. There is only prn IV meds not schedule or continue to infusion. Discussed patient's RN with this matter and continue to monitor patient's condition then make decision need 2nd PIV or not. HS [employer]

## 2021-09-28 DIAGNOSIS — I619 Nontraumatic intracerebral hemorrhage, unspecified: Secondary | ICD-10-CM | POA: Diagnosis not present

## 2021-09-28 DIAGNOSIS — I152 Hypertension secondary to endocrine disorders: Secondary | ICD-10-CM

## 2021-09-28 DIAGNOSIS — E1159 Type 2 diabetes mellitus with other circulatory complications: Secondary | ICD-10-CM

## 2021-09-28 LAB — GLUCOSE, CAPILLARY
Glucose-Capillary: 182 mg/dL — ABNORMAL HIGH (ref 70–99)
Glucose-Capillary: 148 mg/dL — ABNORMAL HIGH (ref 70–99)
Glucose-Capillary: 147 mg/dL — ABNORMAL HIGH (ref 70–99)
Glucose-Capillary: 119 mg/dL — ABNORMAL HIGH (ref 70–99)
Glucose-Capillary: 139 mg/dL — ABNORMAL HIGH (ref 70–99)
Glucose-Capillary: 177 mg/dL — ABNORMAL HIGH (ref 70–99)

## 2021-09-28 LAB — BASIC METABOLIC PANEL
Anion gap: 8 (ref 5–15)
BUN: 37 mg/dL — ABNORMAL HIGH (ref 6–20)
CO2: 22 mmol/L (ref 22–32)
Calcium: 8.8 mg/dL — ABNORMAL LOW (ref 8.9–10.3)
Chloride: 114 mmol/L — ABNORMAL HIGH (ref 98–111)
Creatinine, Ser: 1.12 mg/dL (ref 0.61–1.24)
GFR, Estimated: >60 mL/min (ref >60)
Glucose, Bld: 187 mg/dL — ABNORMAL HIGH (ref 70–99)
Potassium: 3.6 mmol/L (ref 3.5–5.1)
Sodium: 144 mmol/L (ref 135–145)

## 2021-09-28 LAB — CBC
HCT: 36.5 % — ABNORMAL LOW (ref 39.0–52.0)
Hemoglobin: 11.9 g/dL — ABNORMAL LOW (ref 13.0–17.0)
MCH: 30.4 pg (ref 26.0–34.0)
MCHC: 32.6 g/dL (ref 30.0–36.0)
MCV: 93.4 fL (ref 80.0–100.0)
Platelets: 220 10*3/uL (ref 150–400)
RBC: 3.91 MIL/uL — ABNORMAL LOW (ref 4.22–5.81)
RDW: 13.3 % (ref 11.5–15.5)
WBC: 16.3 10*3/uL — ABNORMAL HIGH (ref 4.0–10.5)
nRBC: 0 % (ref 0.0–0.2)

## 2021-09-28 LAB — SODIUM: Sodium: 143 mmol/L (ref 135–145)

## 2021-09-28 MED ORDER — INSULIN DETEMIR 100 UNIT/ML ~~LOC~~ SOLN
27.0000 [IU] | Freq: Two times a day (BID) | SUBCUTANEOUS | Status: DC
  Administered 2021-09-28 – 2021-10-11 (×25): 27 [IU] via SUBCUTANEOUS
  Filled 2021-09-28 (×32): qty 0.27

## 2021-09-28 MED ORDER — HYDRALAZINE HCL 50 MG PO TABS
50.0000 mg | ORAL_TABLET | Freq: Three times a day (TID) | ORAL | Status: DC
  Administered 2021-09-28 – 2021-10-07 (×25): 50 mg
  Filled 2021-09-28 (×26): qty 1

## 2021-09-28 MED ORDER — POTASSIUM CHLORIDE 20 MEQ PO PACK
40.0000 meq | PACK | Freq: Once | ORAL | Status: AC
  Administered 2021-09-28: 40 meq
  Filled 2021-09-28: qty 2

## 2021-09-28 MED ORDER — POTASSIUM CHLORIDE 20 MEQ PO PACK
20.0000 meq | PACK | Freq: Once | ORAL | Status: AC
  Administered 2021-09-28: 20 meq
  Filled 2021-09-28: qty 1

## 2021-09-28 MED ORDER — NUTRISOURCE FIBER PO PACK
1.0000 | PACK | Freq: Three times a day (TID) | ORAL | Status: DC
  Administered 2021-09-28 – 2021-10-16 (×53): 1
  Filled 2021-09-28 (×58): qty 1

## 2021-09-28 MED ORDER — OXYCODONE HCL 5 MG PO TABS
5.0000 mg | ORAL_TABLET | Freq: Three times a day (TID) | ORAL | Status: DC
  Administered 2021-09-28 – 2021-10-16 (×51): 5 mg
  Filled 2021-09-28 (×51): qty 1

## 2021-09-28 MED ORDER — BETHANECHOL CHLORIDE 10 MG PO TABS
25.0000 mg | ORAL_TABLET | Freq: Three times a day (TID) | ORAL | Status: DC
  Administered 2021-09-28 – 2021-10-16 (×51): 25 mg
  Filled 2021-09-28: qty 3
  Filled 2021-09-28: qty 1
  Filled 2021-09-28 (×2): qty 3
  Filled 2021-09-28: qty 1
  Filled 2021-09-28 (×2): qty 3
  Filled 2021-09-28: qty 1
  Filled 2021-09-28 (×2): qty 3
  Filled 2021-09-28 (×3): qty 1
  Filled 2021-09-28 (×3): qty 3
  Filled 2021-09-28: qty 1
  Filled 2021-09-28 (×6): qty 3
  Filled 2021-09-28: qty 1
  Filled 2021-09-28 (×2): qty 3
  Filled 2021-09-28: qty 1
  Filled 2021-09-28 (×4): qty 3
  Filled 2021-09-28: qty 1
  Filled 2021-09-28: qty 3
  Filled 2021-09-28 (×2): qty 1
  Filled 2021-09-28 (×4): qty 3
  Filled 2021-09-28: qty 1
  Filled 2021-09-28: qty 3
  Filled 2021-09-28: qty 1
  Filled 2021-09-28 (×2): qty 3
  Filled 2021-09-28: qty 1
  Filled 2021-09-28 (×2): qty 3
  Filled 2021-09-28 (×2): qty 1
  Filled 2021-09-28 (×2): qty 3

## 2021-09-28 MED ORDER — INSULIN DETEMIR 100 UNIT/ML ~~LOC~~ SOLN
5.0000 [IU] | Freq: Once | SUBCUTANEOUS | Status: AC
  Administered 2021-09-28: 5 [IU] via SUBCUTANEOUS
  Filled 2021-09-28: qty 0.05

## 2021-09-28 NOTE — Plan of Care (Signed)
°  Problem: Safety: Goal: Non-violent Restraint(s) Outcome: Progressing   Problem: Self-Care: Goal: Ability to communicate needs accurately will improve Outcome: Progressing

## 2021-09-28 NOTE — Assessment & Plan Note (Signed)
Adequate glycemic control on current insulin regimen.  Plan:  - Continue current therapy.

## 2021-09-28 NOTE — Assessment & Plan Note (Addendum)
Failed trial of extubation yesterday due to inability to manage secretions.  Presently comfortable on PSV with intermittent sedation.  Plan: - Open ended trach collar trial today.

## 2021-09-28 NOTE — Progress Notes (Signed)
NAME:  Caleb Vaughan, MRN:  [id number], DOB:  Mar 23, 1961, LOS: 25 ADMISSION DATE:  09/20/2021, CONSULTATION DATE:  09/20/2021 REFERRING MD:  Dr. Carren Rang, CHIEF COMPLAINT:  Hemorrhagic stroke    History of Present Illness:  Caleb Vaughan is a 61 y.o. male with PMH listed below  who presented to the emergency department 1/22 as a code stroke, last known normal 2 AM.   Stroke CT on admit revealed, positive for acute right thalamic hemorrhage with intraventricular extension with estimated intra-axial blood volume 25 mL. Mild Regional brain edema. Trace leftward midline shift. Patient was intubated in ED and PCCM was consulted for further assessment.   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 Intubated in ED for increasing somnolence and inability to protect airway.  1/24 increased respiratory distress requiring increased sedation and intermittent neuromuscular blockade.  Improved with diuresis.  3% saline stopped for increasing sodium above 159 1/29 Continue to tolerated wean but gets agitated with decreased sedation, failed extubation 2/2 secretions  Interim History / Subjective:  Reintubated yesterday due to copious oral secretions/ resp distress Foley reinserted for urinary retention Tmax 102  Objective   Blood pressure (!) 151/69, pulse 76, temperature 99.7 F (37.6 C), resp. rate (!) 22, height 5\' 4"  (1.626 m), weight 94.7 kg, SpO2 98 %.    Vent Mode: PSV;CPAP FiO2 (%):  [40 %-70 %] 40 % Set Rate:  [16 bmp] 16 bmp Vt Set:  [470 mL] 470 mL PEEP:  [8 cmH20-10 cmH20] 8 cmH20 Pressure Support:  [8 cmH20] 8 cmH20   Intake/Output Summary (Last 24 hours) at 09/28/2021 1111 Last  data filed at 09/28/2021 0900 Gross per 24 hour  Intake 4500 ml  Output 2140 ml  Net 2360 ml   Filed Weights   09/24/21 0500 09/25/21 0500 09/27/21 0500  Weight: 98 kg 95 kg 94.7 kg   Examination: General:  adult male on MV in NAD HEENT: MM pink/moist, ETT, cortrak, pupils 3/reactive Neuro:  does not open eyes but will follow commands on RUE and RLE, withdrawals to pain on LUE/ LLE CV: rr, no murmur PULM:  MV supported, currently weaning PSV 8/5 doing well, coarse scattered rhonchi on right, left clear, no secretions GI: soft, bs+, flexiseal, foley  Extremities: warm/dry, no LE edema  Skin: no rashes   UOP 2.7L/ 24hrs Net +5L   Ancillary tests personally reviewed.    CT head: No progression r thalamocapsular hematoma with intraventricular extension. No hydrocephalus, or mass effect   Assessment & Plan:  Nontraumatic thalamic hemorrhage Cerebral edema  -Neuro suspects secondary to HTN in the setting of coumadin use. CT head: No progression r thalamocapsular hematoma with intraventricular extension. No hydrocephalus, or mass effect. - 1/24, HTS stopped, at goal P: Per Neurology Continue supportive care- maintain neuro protective measures; goal for eurothermia, euglycemia, eunatermia, normoxia, and PCO2 goal of 35-40 SBP goal < 160   Acute respiratory failure with hypoxia Possible aspiration event 1/29 OSA Hx, Tracheal stenosis -  failed extubation 1/29  P: Continue MV support, PRVC Daily SBT trials but likely will need tracheostomy moving forward, will discuss with family today VAP prevention protocol/ PPI PAD protocol for sedation> prn fentanyl with bowel regimen,  adding enteral oxy today  Wean FiO2 as able for SpO2 >92%  No significant secretions today, monitor, if ongoing fever and worsening secretions, send for culture and start abx for aspiration  Paroxysmal afib HX HTN, HLD -Currently in NSR P: Cont telemetry  Continue Flecainide  To remain off  anticoagulation for at leat 1 month resumption per neuro  Continue norvasc, coreg, and increase hydralazine today as BP goal < 160 and he has been running on the higher end over 24hrs.  Prn labetalol/ hydralazine/ lopressor Optimize electrolytes, goal K > 4, Mag > 2 Monitor I/Os  Acute hypernatremia, resolved  -iatrogenic  Hypokelemia -3% stopped on 1/25-26 P: Continue free water  K supplementation  Trend BMET  DMII -BG 148- 219 P: Continue SSI resistant, TF coverage 5units q4, and increase levemir to 27 units BID    CBG goal 140-180  Urinary retention - continue foley, reinserted 1/29 - increase bethanechol 1/30  Fever Leukocytosis  - Suspected from aspiration event 1/29 vs central - repeat BC from 1/27 ngtd - monitor clinically for now  Best Practice (right click and "Reselect all SmartList Selections" daily)   Diet/type: tubefeeds, adding fiber to help with loose stool  DVT prophylaxis: SCD and heparin   GI prophylaxis: PPI Lines: Central line- PICC RUE- no longer needed, will d/c when 2 PIV established Foley:  Yes, and it is still needed Code Status:  full code Last date of multidisciplinary goals of care discussion: pending for 1/30  CRITICAL CARE Performed by: Kennieth Rad  Total critical care time: 30 minutes  Critical care time was exclusive of separately billable procedures and treating other patients.  Critical care was necessary to treat or prevent imminent or life-threatening deterioration.  Critical care was time spent personally by me on the following activities: development of treatment plan with patient and/or surrogate as well as nursing, discussions with consultants, evaluation of patient's response to treatment, examination of patient, obtaining history from patient or surrogate, ordering and performing treatments and interventions, ordering and review of laboratory studies, ordering and review of radiographic studies, pulse oximetry and  re-evaluation of patient's condition.   Kennieth Rad, ACNP [facility] 09/28/2021, 11:11 AM

## 2021-09-28 NOTE — Progress Notes (Addendum)
STROKE TEAM PROGRESS NOTE   INTERVAL HISTORY Patient is seen in his room with his cousins at the bedside.  He was reintubated yesterday evening due to respiratory failure with inability to handle his airway due to copious secretions.  He has been hemodynamically stable and his neurological exam is stable.  Vitals:   09/28/21 0900 09/28/21 0938 09/28/21 1108 09/28/21 1109  BP: (!) 147/68 (!) 151/69 (!) 120/56   Pulse: 76  72   Resp: (!) 22  19   Temp: 99.7 F (37.6 C)  99.7 F (37.6 C)   TempSrc:      SpO2: 98%  95% 95%  Weight:      Height:       CBC:  Recent Labs  Lab 09/27/21 0936 09/27/21 1927 09/28/21 0338  WBC 11.7*  --  16.3*  NEUTROABS 8.7*  --   --   HGB 12.5* 11.6* 11.9*  HCT 38.8* 34.0* 36.5*  MCV 94.9  --  93.4  PLT 228  --  [id number]    Basic Metabolic Panel:  Recent Labs  Lab 09/22/21 1710 09/23/21 0020 09/24/21 0517 09/24/21 1135 09/26/21 0429 09/26/21 1227 09/27/21 2114 09/28/21 0338 09/28/21 1147  NA 154*   < > 162*   < > 154*   < > 142 144 143  K  --    < > 3.8   < > 3.4*   < > 3.2* 3.6  --   CL  --    < > >130*   < > 125*   < > 113* 114*  --   CO2  --    < > 24   < > 22   < > 21* 22  --   GLUCOSE  --    < > 238*   < > 199*   < > 199* 187*  --   BUN  --    < > 31*   < > 33*   < > 35* 37*  --   CREATININE  --    < > 1.09   < > 1.09   < > 1.11 1.12  --   CALCIUM  --    < > 9.2   < > 9.2   < > 8.8* 8.8*  --   MG 2.8*  --   --   --  2.7*  --   --   --   --   PHOS 2.3*  --  3.4  --  3.4  --   --   --   --    < > = values in this interval not displayed.    Lipid Panel:  Recent Labs  Lab 09/23/21 0556  TRIG 330*    HgbA1c: No results for input(s): HGBA1C in the last 168 hours.  Urine Drug Screen:  No results for input(s): LABOPIA, COCAINSCRNUR, LABBENZ, AMPHETMU, THCU, LABBARB in the last 168 hours.   Alcohol Level No results for input(s): ETH in the last 168 hours.  IMAGING past 24 hours DG Chest Port 1 View  Result Date:  09/27/2021 CLINICAL DATA:  Chest congestion. EXAM: PORTABLE CHEST 1 VIEW COMPARISON:  Chest x-ray 09/26/2021. FINDINGS: Endotracheal tube tip is 3.3 cm above the carina. Enteric tube tip is in the distal stomach. Right upper extremity PICC terminates in the mid SVC. Cardiomediastinal silhouette is stable. There is stable left basilar atelectasis/airspace disease. There is no new lung consolidation, pleural effusion or pneumothorax. No acute fractures are seen. IMPRESSION: 1.  Lines and tubes appear in adequate position. 2. Stable left basilar atelectasis/airspace disease. Electronically Signed   By: Ronney Asters M.D.   On: 09/27/2021 19:07    PHYSICAL EXAM  Physical Exam  Constitutional: Appears well-developed and well-nourished.  Middle-aged Caucasian male not in distress Cardiovascular: Normal rate and regular rhythm.  Respiratory: Respirations synchronous with ventilator  Neuro: Intubated off sedation.  Easily arousable and follows.  Left pupil larger than right pupil, both reactive to light.  Will open eyes and to voice briefly.  Right gaze preference.  Will follow commands with RUE and RLE, no movement of LUE and LLE  ASSESSMENT/PLAN [name] is a 61 y.o. male with history of AFib on Coumadin s/p recent conversion, HTN, HLD, remote tobacco abuse, Cataract OU, glaucoma, obesity, CVA, DM II, and OSA who initially presented to the ED via EMS left sided weakness, dysarthria, and right fixed gaze. Coumadin reversed. Labetalol and cleviprex used for BP greater than 140 during CT. Patient was then intubated due to somnolence and inability to protect his airway. CT and MRI show stable right thalamic hemorrhage with intraventricular extension. ICH score 2. Febrile. Hypertonic saline discontinued 1/23. Most recent sodium is 155.  Currently in NSR with flecainide continued. Holding home antihypertensive medications currently. No longer using cleviprex or norepinephrine (r/t hypotension after RSI)  for BP control. Repeat CT shows no significant change in the hemorrhage or ventricle size. Weaning 1/28 and 1/29 at 30% , 5/5. Extubated 1/29 AM and reintubated 1/29 PM due to copious secretions.  May attempt extubation again in a few days or plan for tracheostomy.  Stroke:  Acute right thalamic ICH and IVH likely secondary to hypertension in the setting of coumadin use Code stroke CT - Positive for acute right thalamic hemorrhage with intraventricular extension. Estimated intra-axial blood volume 25 mL. Mild Regional brain edema. Trace leftward midline shift. 1/22 Repeat CT shows no changes from code stroke CT MRI  Stable right thalamic hemorrhage since presentation. Regional edema, tracking into the midbrain. 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 No increased intracranial mass effect. MRA  Abundant chronic micro-hemorrhages in the brain superimposed on chronic lacunar infarcts compatible with advanced small vessel disease CT head 1/27 - unchanged hematoma and ventricular size Carotid Doppler  1-39% stenosis in Left and Right ICA 2D Echo EF 60-65% LDL 52 HgbA1c 9.7 VTE prophylaxis - heparin warfarin daily prior to admission, now on No antithrombotic. ICH Therapy recommendations:  pending Disposition:  pending  Cerebral Edema with trace left midline shift Stable MRI and repeat CT scan Hypertonic saline discontinued Free water flushes given 200cc->400cc q4h NA- 150-> 154-> 156-> 155 -> 157 -> 164->158-> 154->148-> 143  Atrial Fibrillation Home meds: Flecainide, coreg, coumadin Flecainide continued, patient is in NSR currently  Fever ? Central fever Blood culture 1/2 staph epidermidis Repeat blood culture pending  Hypertension Home meds:  Amlodipine, coreg, losartan-hydrochlorothiazide Restart gradually BP goal less than 160 BP labile post intubation, levophed d/c'd  Hyperlipidemia Home meds: Lipitor 80 LDL 52, goal < 70 Consider to resume Lipitor on  discharge  Diabetes type II Uncontrolled Home meds:  Metformin, jardiance HgbA1c 9.7 , goal < 7.0 CBGs SSI Close PCP follow-up as outpatient for better DM control  Other Stroke Risk Factors Cigarette smoker, advised to stop smoking ETOH use, advised to drink no more than 2 drink(s) a day Obesity, Body mass index is 35.84 kg/m., BMI >/= 30 associated with increased stroke risk, recommend weight loss, diet and exercise as  appropriate  Obstructive sleep apnea  Other Active Problems Acute hypoxemic respiratory failure CCM consult for intubation and ventilator management Sedation with propofol and fentanyl Monitor triglycerides Difficult intubation r/t tracheal stenosis Weaning 30% 5/5 Extubated 1/29 and reintubated Vent dyssynchrony  PRN paralytic and sedation  Extubation vs tracheostomy   Hospital day # 8   Patient seen and examined by NP/APP with MD. MD to update note as needed.   [place] , MSN, AGACNP-BC [facility] See Amion for schedule and pager information 09/28/2021 1:08 PM   STROKE MD NOTE :  I have personally obtained history,examined this patient, reviewed notes, independently viewed imaging studies, participated in medical decision making and plan of care.ROS completed by me personally and pertinent positives fully documented  I have made any additions or clarifications directly to the above note. Agree with note above.  Continue strict blood pressure control with systolic goal below 0000000.  Continue ventilatory support for respiratory failure for few more days and then consider repeat trial of extubation versus early tracheostomy if she fails.  Long discussion with patient and wife at the bedside and answered questions.  Discussed with Dr. Lynetta Mare critical care medicine.This patient is critically ill and at significant risk of neurological worsening, death and care requires constant monitoring of vital signs, hemodynamics,respiratory and cardiac  monitoring, extensive review of multiple databases, frequent neurological assessment, discussion with family, other specialists and medical decision making of high complexity.I have made any additions or clarifications directly to the above note.This critical care time does not reflect procedure time, or teaching time or supervisory time of PA/NP/Med Resident etc but could involve care discussion time.  I spent 30 minutes of neurocritical care time  in the care of  this patient.      Antony Contras, MD Medical Director [place] Pager: [phone] 09/28/2021 4:35 PM   To contact Stroke Continuity provider, please refer to [email or web address] After hours, contact General Neurology

## 2021-09-28 NOTE — Assessment & Plan Note (Addendum)
Remains in sinus rhythm  Plan  - Continue flecainide.

## 2021-09-28 NOTE — Progress Notes (Signed)
OT Cancellation Note  Patient Details Name: Caleb Vaughan MRN: [id number] DOB: Jan 18, 1961   Cancelled Treatment:    Reason Eval/Treat Not Completed: Other (comment) PT assessed pt today. Will attempt to see tomorrow if appropriate.   Thornell Mule, OT/L   Acute OT Clinical Specialist Acute Rehabilitation Services Pager [phone] Office [phone]  09/28/2021, 1:19 PM

## 2021-09-28 NOTE — Evaluation (Signed)
Physical Therapy Evaluation Patient Details Name: Caleb Vaughan MRN: [id number] DOB: Apr 30, 1961 Today's Date: 09/28/2021  History of Present Illness  Patient is a 61 y/o male admitted due to R thalamic hemorrhage, edema and trace leftward midline shift, intubated on admission, attempted extubation 1/29, re-intubated that evening.  PMH positive for a-fib on coumadin, HTN, HLD, previous smoker, prior CVA, DM, OSA.  Clinical Impression  Patient presents with decreased mobility due to generalized weakness, decreased balance, L side hemiparesis, decreased arousal/awareness and attention and decreased sitting balance and activity tolerance.  Currently able to sit EOB about 6 minutes max to total A for bed mobility and not well tolerated with coughing on vent and thick secretions suctioned.  PT will continue to follow acutely       Recommendations for follow up therapy are one component of a multi-disciplinary discharge planning process, led by the attending physician.  Recommendations may be updated based on patient status, additional functional criteria and insurance authorization.  Follow Up Recommendations Acute inpatient rehab (3hours/day)    Assistance Recommended at Discharge Frequent or constant Supervision/Assistance  Patient can return home with the following  Two people to help with walking and/or transfers;Two people to help with bathing/dressing/bathroom    Equipment Recommendations Other (comment) (TBA)  Recommendations for Other Services       Functional Status Assessment Patient has had a recent decline in their functional status and demonstrates the ability to make significant improvements in function in a reasonable and predictable amount of time.     Precautions / Restrictions Precautions Precautions: Fall Precaution Comments: intubated, coretrack, rectal tube, L hemiparesis      Mobility  Bed Mobility Overal bed mobility: Needs Assistance Bed Mobility: Supine to  Sit, Sit to Supine     Supine to sit: Max assist, HOB elevated Sit to supine: +2 for physical assistance, Total assist   General bed mobility comments: assist for legs off bed and trunk up to sit using R UE, to supine A for all aspects    Transfers                   General transfer comment: NT, pt coughing on vent in sitting    Ambulation/Gait                  Stairs            Wheelchair Mobility    Modified Rankin (Stroke Patients Only) Modified Rankin (Stroke Patients Only) Pre-Morbid Rankin Score: No symptoms Modified Rankin: Severe disability     Balance Overall balance assessment: Needs assistance Sitting-balance support: Feet supported Sitting balance-Leahy Scale: Poor Sitting balance - Comments: mod A for sitting balance, up at EOB about 6 minutes                                     Pertinent Vitals/Pain Pain Assessment Facial Expression: Tense Body Movements: Protection Muscle Tension: Tense, rigid Compliance with ventilator (intubated pts.): Coughing but tolerating Vocalization (extubated pts.): N/A CPOT Total: 4    Home Living Family/patient expects to be discharged to:: Private residence Living Arrangements: Children (son) Available Help at Discharge: Family;Available PRN/intermittently Type of Home: House Home Access: Stairs to enter Entrance Stairs-Rails: None Entrance Stairs-Number of Steps: 2 in garage   Home Layout: One level Home Equipment: None Additional Comments: son works nights, mother close, but could only supervise    Prior Function Prior Level  of Function : Independent/Modified Independent;Working/employed             Mobility Comments: working at [employer]        Extremity/Trunk Assessment   Upper Extremity Assessment Upper Extremity Assessment: LUE deficits/detail LUE Deficits / Details: PROM WFL, some active movement but not antigravity LUE Coordination:  decreased fine motor;decreased gross motor    Lower Extremity Assessment Lower Extremity Assessment: LLE deficits/detail;RLE deficits/detail RLE Deficits / Details: AAROM limited ankle DF to about 10 from neutral; strength hip flexion 2/5, knee extension 3-/5 LLE Deficits / Details: AAROM tight heel cords; strength grossly 1/5       Communication   Communication: Other (comment) (Intubated)  Cognition Arousal/Alertness: Lethargic Behavior During Therapy: Flat affect Overall Cognitive Status: Difficult to assess                                 General Comments: eyes closed in supine, but followed simple commands for R UE/LE movement, but would not open eyes        General Comments General comments (skin integrity, edema, etc.): mother and aunt visiting in the room to give history; RN in to suction once supine    Exercises     Assessment/Plan    PT Assessment Patient needs continued PT services  PT Problem List Decreased strength;Decreased mobility;Decreased balance;Decreased knowledge of use of DME;Cardiopulmonary status limiting activity;Decreased coordination;Decreased activity tolerance;Decreased cognition;Decreased knowledge of precautions       PT Treatment Interventions DME instruction;Therapeutic activities;Patient/family education;Therapeutic exercise;Gait training;Balance training;Functional mobility training;Neuromuscular re-education;Wheelchair mobility training;Cognitive remediation    PT Goals (Current goals can be found in the Care Plan section)  Acute Rehab PT Goals Patient Stated Goal: mother hopeful for rehab PT Goal Formulation: With family Time For Goal Achievement: 10/12/21 Potential to Achieve Goals: Fair    Frequency Min 4X/week     Co-evaluation               AM-PAC PT "6 Clicks" Mobility  Outcome Measure Help needed turning from your back to your side while in a flat bed without using bedrails?: Total Help needed moving  from lying on your back to sitting on the side of a flat bed without using bedrails?: Total Help needed moving to and from a bed to a chair (including a wheelchair)?: Total Help needed standing up from a chair using your arms (e.g., wheelchair or bedside chair)?: Total Help needed to walk in hospital room?: Total Help needed climbing 3-5 steps with a railing? : Total 6 Click Score: 6    End of Session   Activity Tolerance: Treatment limited secondary to medical complications (Comment) (coughing, thick secretions) Patient left: in bed;with family/visitor present   PT Visit Diagnosis: Hemiplegia and hemiparesis;Other abnormalities of gait and mobility (R26.89);Muscle weakness (generalized) (M62.81) Hemiplegia - Right/Left: Left Hemiplegia - dominant/non-dominant: Non-dominant Hemiplegia - caused by: Nontraumatic intracerebral hemorrhage    Time: 1155-1224 PT Time Calculation (min) (ACUTE ONLY): 29 min   Charges:   PT Evaluation $PT Eval High Complexity: 1 High PT Treatments $Therapeutic Activity: 8-22 mins        Magda Kiel, PT Acute Rehabilitation Services [id number] Office:[phone] 09/28/2021   [name] 09/28/2021, 4:05 PM

## 2021-09-28 NOTE — Assessment & Plan Note (Addendum)
SBP ranging 140-150 with goal of <120  Plan:  -Continue Norvasc, hydralazine and carvedilol.

## 2021-09-28 NOTE — Assessment & Plan Note (Addendum)
Right thalamic hemorrhage with intraventricular extension.  61mL. ICH score 2 Examination off sedation has been consistent: will follow commands on the right side predictably, extension to pain on the left. Does not open eyes.  Level of alertness is variable.   Plan:  - Expect slow recovery but may be substantial given age and with clearance of interventricular clot.  - Tracheostomy today

## 2021-09-28 NOTE — Progress Notes (Signed)
Patient working with PT. RN will re enter order when patient done with PT.

## 2021-09-29 ENCOUNTER — Inpatient Hospital Stay (HOSPITAL_COMMUNITY): Payer: No Typology Code available for payment source

## 2021-09-29 DIAGNOSIS — I619 Nontraumatic intracerebral hemorrhage, unspecified: Secondary | ICD-10-CM | POA: Diagnosis not present

## 2021-09-29 LAB — CBC
HCT: 34.3 % — ABNORMAL LOW (ref 39.0–52.0)
Hemoglobin: 11.3 g/dL — ABNORMAL LOW (ref 13.0–17.0)
MCH: 30.2 pg (ref 26.0–34.0)
MCHC: 32.9 g/dL (ref 30.0–36.0)
MCV: 91.7 fL (ref 80.0–100.0)
Platelets: 224 10*3/uL (ref 150–400)
RBC: 3.74 MIL/uL — ABNORMAL LOW (ref 4.22–5.81)
RDW: 12.9 % (ref 11.5–15.5)
WBC: 10.6 10*3/uL — ABNORMAL HIGH (ref 4.0–10.5)
nRBC: 0 % (ref 0.0–0.2)

## 2021-09-29 LAB — APTT: aPTT: 27 seconds (ref 24–36)

## 2021-09-29 LAB — RENAL FUNCTION PANEL
Albumin: 2.6 g/dL — ABNORMAL LOW (ref 3.5–5.0)
Anion gap: 9 (ref 5–15)
BUN: 32 mg/dL — ABNORMAL HIGH (ref 6–20)
CO2: 20 mmol/L — ABNORMAL LOW (ref 22–32)
Calcium: 8.9 mg/dL (ref 8.9–10.3)
Chloride: 111 mmol/L (ref 98–111)
Creatinine, Ser: 0.99 mg/dL (ref 0.61–1.24)
GFR, Estimated: >60 mL/min (ref >60)
Glucose, Bld: 122 mg/dL — ABNORMAL HIGH (ref 70–99)
Phosphorus: 3.3 mg/dL (ref 2.5–4.6)
Potassium: 3.9 mmol/L (ref 3.5–5.1)
Sodium: 140 mmol/L (ref 135–145)

## 2021-09-29 LAB — PROTIME-INR
INR: 0.9 (ref 0.8–1.2)
Prothrombin Time: 12.5 seconds (ref 11.4–15.2)

## 2021-09-29 LAB — MAGNESIUM: Magnesium: 2.6 mg/dL — ABNORMAL HIGH (ref 1.7–2.4)

## 2021-09-29 LAB — GLUCOSE, CAPILLARY
Glucose-Capillary: 114 mg/dL — ABNORMAL HIGH (ref 70–99)
Glucose-Capillary: 125 mg/dL — ABNORMAL HIGH (ref 70–99)
Glucose-Capillary: 130 mg/dL — ABNORMAL HIGH (ref 70–99)
Glucose-Capillary: 135 mg/dL — ABNORMAL HIGH (ref 70–99)
Glucose-Capillary: 163 mg/dL — ABNORMAL HIGH (ref 70–99)
Glucose-Capillary: 164 mg/dL — ABNORMAL HIGH (ref 70–99)

## 2021-09-29 MED ORDER — FUROSEMIDE 10 MG/ML IJ SOLN
40.0000 mg | Freq: Once | INTRAMUSCULAR | Status: AC
  Administered 2021-09-29: 40 mg via INTRAVENOUS
  Filled 2021-09-29: qty 4

## 2021-09-29 MED ORDER — MIDAZOLAM HCL 2 MG/2ML IJ SOLN
4.0000 mg | Freq: Once | INTRAMUSCULAR | Status: AC
  Administered 2021-09-29: 4 mg via INTRAVENOUS
  Filled 2021-09-29: qty 4

## 2021-09-29 MED ORDER — POTASSIUM CHLORIDE 20 MEQ PO PACK
40.0000 meq | PACK | Freq: Once | ORAL | Status: AC
  Administered 2021-09-29: 40 meq
  Filled 2021-09-29: qty 2

## 2021-09-29 MED ORDER — FENTANYL CITRATE PF 50 MCG/ML IJ SOSY
200.0000 ug | PREFILLED_SYRINGE | Freq: Once | INTRAMUSCULAR | Status: AC
  Administered 2021-09-29: 200 ug via INTRAVENOUS

## 2021-09-29 MED ORDER — FENTANYL 2500MCG IN NS 250ML (10MCG/ML) PREMIX INFUSION
0.0000 ug/h | INTRAVENOUS | Status: DC
  Administered 2021-09-29: 25 ug/h via INTRAVENOUS
  Filled 2021-09-29: qty 250

## 2021-09-29 MED ORDER — FREE WATER
200.0000 mL | Status: DC
  Administered 2021-09-29 – 2021-10-16 (×128): 200 mL

## 2021-09-29 MED ORDER — VECURONIUM BROMIDE 10 MG IV SOLR
10.0000 mg | Freq: Once | INTRAVENOUS | Status: AC
  Administered 2021-09-29: 10 mg via INTRAVENOUS
  Filled 2021-09-29: qty 10

## 2021-09-29 MED ORDER — GERHARDT'S BUTT CREAM
TOPICAL_CREAM | Freq: Two times a day (BID) | CUTANEOUS | Status: DC
  Administered 2021-09-30 – 2021-10-08 (×5): 1 via TOPICAL
  Filled 2021-09-29 (×2): qty 1

## 2021-09-29 MED ORDER — PROPOFOL 10 MG/ML IV BOLUS
50.0000 mg | Freq: Once | INTRAVENOUS | Status: AC
  Administered 2021-09-29: 50 mg via INTRAVENOUS
  Filled 2021-09-29: qty 20

## 2021-09-29 NOTE — Progress Notes (Signed)
NAME:  Caleb Vaughan, MRN:  [id number], DOB:  1961/08/10, LOS: 9 ADMISSION DATE:  09/20/2021, CONSULTATION DATE:  09/20/2021 REFERRING MD:  Dr. Oretha Milch, CHIEF COMPLAINT:  Hemorrhagic stroke    History of Present Illness:  Caleb Vaughan is a 61 y.o. male with PMH listed below  who presented to the emergency department 1/22 as a code stroke, last known normal 2 AM.   Stroke CT on admit revealed, positive for acute right thalamic hemorrhage with intraventricular extension with estimated intra-axial blood volume 25 mL. Mild Regional brain edema. Trace leftward midline shift. Patient was intubated in ED and PCCM was consulted for further assessment.   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 Intubated in ED for increasing somnolence and inability to protect airway.  1/24 increased respiratory distress requiring increased sedation and intermittent neuromuscular blockade.  Improved with diuresis.  3% saline stopped for increasing sodium above 159 1/29 Continue to tolerated wean but gets agitated with decreased sedation, failed extubation 2/2 secretions, foley placed for urinary retention, tmax 102 1/30 PICC removed, increased bethanechol, weaned x 12 hrs   Interim History / Subjective:  No events overnight Tmax 99.7 Trach today around 2pm  Objective   Blood pressure (!) 115/51, pulse 63, temperature 98.1 F (36.7 C), resp. rate 17, height 5\' 4"  (1.626 m), weight 94.7 kg, SpO2 96 %.    Vent Mode: PSV;CPAP FiO2 (%):  [40 %] 40 % Set Rate:  [16 bmp] 16 bmp Vt Set:  [470 mL] 470 mL PEEP:  [5 cmH20] 5 cmH20 Pressure Support:  [5 cmH20] 5 cmH20 Plateau Pressure:  [13 cmH20-15 cmH20] 15  cmH20   Intake/Output Summary (Last 24 hours) at 09/29/2021 0758 Last data filed at 09/29/2021 0600 Gross per 24 hour  Intake 2805.42 ml  Output 1735 ml  Net 1070.42 ml   Filed Weights   09/24/21 0500 09/25/21 0500 09/27/21 0500  Weight: 98 kg 95 kg 94.7 kg   Examination: Fentanyl gtt at 100 mcg/hr General:  acute on chronically ill appearing male on MV in NAD HEENT: MM pink/moist, ETT, left nare cortrak, pupils 4/reactive Neuro: will open eyes to verbal and f/c on RUE and RLE, left hemiplegia  CV:  rr, NSR  PULM:  non labored, coarse, no wheezing, minimal tan secretions GI: soft, bs+, NT, foley, flexiseal  Extremities: warm/dry, no LE edema  Skin: no rashes   UOP 1.5L/ 24hrs Net +6L   CBG 114- 177 Labs K 3.9, Na 140, WBC 16.3 > 10.6, Hgb 11.9 > 11.3  Ancillary tests   1/23 MRI >  Stable right thalamic hemorrhage since presentation. Regional edema, tracking into the midbrain. 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 No increased intracranial mass effect. 1/23 MRA>  Abundant chronic micro-hemorrhages in the brain superimposed on chronic lacunar infarcts compatible with advanced small vessel disease 1/23 TTE> LVEF 60-65, no wall abnormalities, normal RV 1/27 CT head: No progression r thalamocapsular hematoma with intraventricular extension. No hydrocephalus, or mass effect   Assessment & Plan:  Nontraumatic thalamic hemorrhage Cerebral edema  -Neuro suspects secondary to HTN in the setting of coumadin use. CT head: No progression r thalamocapsular hematoma with intraventricular extension. No hydrocephalus, or  mass effect. - 1/24, HTS stopped, at goal P: - Per Neurology - Continue supportive care- maintain neuro protective measures; goal for eurothermia, euglycemia, eunatermia, normoxia, and PCO2 goal of 35-40 - SBP goal < 160  - serial neuro exams   Acute respiratory failure with hypoxia Possible aspiration event 1/29 OSA Hx, Tracheal stenosis -  failed  extubation 1/29 due to secretions P: - full MV support - trach planned today ~ 2pm - daily SBT, has been weaning well for last 2 days, hopeful to get to trach collar soon - VAP /PPI  - prn BD, ongoing aggressive pulmonary hygiene  - PAD protocol > hopeful to not need fentanyl gtt after trach.  Enteral oxy added 1/30  Paroxysmal afib HX HTN, HLD -Currently in NSR P: - tele monitoring - cont flecainide - continue norvasc, coreg, hydralazine with prn's  - goal K > 4, Mag > 2 - lasix x 1 today   Acute hypernatremia, resolved  -iatrogenic  Hypokelemia -3% stopped on 1/25-26 P: - decrease free water today - Trend BMET  DMII P: - glucose in better range today - cont SSI resistant, TF coverage 5units q4, and levemir 27 units BID    - CBG goal < 180  Urinary retention - continue foley, reinserted 1/29 - increase bethanechol 1/30  Fever Leukocytosis  - Suspected from aspiration event 1/29 vs central.  WBC improving 1/31 - repeat BC from 1/27 ngtd - monitor clinically for now  Best Practice (right click and "Reselect all SmartList Selections" daily)   Diet/type: tubefeeds, w/ fiber  DVT prophylaxis: SCD and heparin [place]  GI prophylaxis: PPI Lines: N/A Foley:  Yes, and it is still needed Code Status:  full code Last date of multidisciplinary goals of care discussion:  ongoing  CRITICAL CARE Performed by: Posey Boyer  Total critical care time: 30 minutes  Critical care time was exclusive of separately billable procedures and treating other patients.  Critical care was necessary to treat or prevent imminent or life-threatening deterioration.  Critical care was time spent personally by me on the following activities: development of treatment plan with patient and/or surrogate as well as nursing, discussions with consultants, evaluation of patient's response to treatment, examination of patient, obtaining history from patient or surrogate, ordering and performing  treatments and interventions, ordering and review of laboratory studies, ordering and review of radiographic studies, pulse oximetry and re-evaluation of patient's condition.   Posey Boyer, ACNP [facility] 09/29/2021, 7:58 AM

## 2021-09-29 NOTE — Progress Notes (Addendum)
STROKE TEAM PROGRESS NOTE   INTERVAL HISTORY Patient is seen in his room with no family at the bedside.  He has remained hemodynamically stable and his neurological exam is unchanged.  Plan for tracheostomy later today by critical care team.  Remains on ventilatory support for respiratory failure to inability to handle secretions and protect his airway  Vitals:   09/29/21 1100 09/29/21 1200 09/29/21 1211 09/29/21 1212  BP: 132/65 (!) 148/78 (!) 148/78   Pulse: 71 74 73   Resp: 18 16 16    Temp: 98.6 F (37 C) 99 F (37.2 C) 99 F (37.2 C)   TempSrc:      SpO2: 90% 96% 96% 96%  Weight:      Height:       CBC:  Recent Labs  Lab 09/27/21 0936 09/27/21 1927 09/28/21 0338 09/29/21 0358  WBC 11.7*  --  16.3* 10.6*  NEUTROABS 8.7*  --   --   --   HGB 12.5*   < > 11.9* 11.3*  HCT 38.8*   < > 36.5* 34.3*  MCV 94.9  --  93.4 91.7  PLT 228  --  220 224   < > = values in this interval not displayed.    Basic Metabolic Panel:  Recent Labs  Lab 09/26/21 0429 09/26/21 1227 09/28/21 0338 09/28/21 1147 09/29/21 0358  NA 154*   < > 144 143 140  K 3.4*   < > 3.6  --  3.9  CL 125*   < > 114*  --  111  CO2 22   < > 22  --  20*  GLUCOSE 199*   < > 187*  --  122*  BUN 33*   < > 37*  --  32*  CREATININE 1.09   < > 1.12  --  0.99  CALCIUM 9.2   < > 8.8*  --  8.9  MG 2.7*  --   --   --  2.6*  PHOS 3.4  --   --   --  3.3   < > = values in this interval not displayed.    Lipid Panel:  Recent Labs  Lab 09/23/21 0556  TRIG 330*    HgbA1c: No results for input(s): HGBA1C in the last 168 hours.  Urine Drug Screen:  No results for input(s): LABOPIA, COCAINSCRNUR, LABBENZ, AMPHETMU, THCU, LABBARB in the last 168 hours.   Alcohol Level No results for input(s): ETH in the last 168 hours.  IMAGING past 24 hours No results found.  PHYSICAL EXAM  Physical Exam  Constitutional: Appears well-developed and well-nourished.  Middle-aged Caucasian male not in distress Cardiovascular:  Normal rate and regular rhythm.  Respiratory: Respirations synchronous with ventilator  Neuro: Intubated on sedation with fentanyl.  Easily arousable and follows.  Left pupil larger than right pupil, both reactive to light.  Right gaze preference.  Will follow commands with RUE and RLE, no movement of LUE and LLE  ASSESSMENT/PLAN [name] is a 61 y.o. male with history of AFib on Coumadin s/p recent conversion, HTN, HLD, remote tobacco abuse, Cataract OU, glaucoma, obesity, CVA, DM II, and OSA who initially presented to the ED via EMS left sided weakness, dysarthria, and right fixed gaze. Coumadin reversed. Labetalol and cleviprex used for BP greater than 140 during CT. Patient was then intubated due to somnolence and inability to protect his airway. CT and MRI show stable right thalamic hemorrhage with intraventricular extension. ICH score 2. Febrile. Hypertonic saline discontinued  1/23. Most recent sodium is 155.  Currently in NSR with flecainide continued. Holding home antihypertensive medications currently. No longer using cleviprex or norepinephrine (r/t hypotension after RSI) for BP control. Repeat CT shows no significant change in the hemorrhage or ventricle size. Weaning 1/28 and 1/29 at 30% , 5/5. Extubated 1/29 AM and reintubated 1/29 PM due to copious secretions.  May attempt extubation again in a few days or plan for tracheostomy.  Stroke:  Acute right thalamic ICH and IVH likely secondary to hypertension in the setting of coumadin use Code stroke CT - Positive for acute right thalamic hemorrhage with intraventricular extension. Estimated intra-axial blood volume 25 mL. Mild Regional brain edema. Trace leftward midline shift. 1/22 Repeat CT shows no changes from code stroke CT MRI  Stable right thalamic hemorrhage since presentation. Regional edema, tracking into the midbrain. 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 No increased intracranial mass effect. MRA   Abundant chronic micro-hemorrhages in the brain superimposed on chronic lacunar infarcts compatible with advanced small vessel disease CT head 1/27 - unchanged hematoma and ventricular size Carotid Doppler  1-39% stenosis in Left and Right ICA 2D Echo EF 60-65% LDL 52 HgbA1c 9.7 VTE prophylaxis - heparin warfarin daily prior to admission, now on No antithrombotic. ICH Therapy recommendations:  pending Disposition:  pending  Cerebral Edema with trace left midline shift Stable MRI and repeat CT scan Hypertonic saline discontinued Free water flushes given 200cc->400cc q4h NA- 150-> 154-> 156-> 155 -> 157 -> 164->158-> 154->148-> 143  Atrial Fibrillation Home meds: Flecainide, coreg, coumadin Flecainide continued, patient is in NSR currently  Fever ? Central fever Blood culture 1/2 staph epidermidis Repeat blood culture no growth  Hypertension Home meds:  Amlodipine, coreg, losartan-hydrochlorothiazide Restart gradually BP goal less than 160 BP labile post intubation, levophed d/c'd  Hyperlipidemia Home meds: Lipitor 80 LDL 52, goal < 70 Consider to resume Lipitor on discharge  Diabetes type II Uncontrolled Home meds:  Metformin, jardiance HgbA1c 9.7 , goal < 7.0 CBGs SSI Close PCP follow-up as outpatient for better DM control  Other Stroke Risk Factors Cigarette smoker, advised to stop smoking ETOH use, advised to drink no more than 2 drink(s) a day Obesity, Body mass index is 35.84 kg/m., BMI >/= 30 associated with increased stroke risk, recommend weight loss, diet and exercise as appropriate  Obstructive sleep apnea  Other Active Problems Acute hypoxemic respiratory failure CCM consult for intubation and ventilator management Sedation with propofol and fentanyl Monitor triglycerides Difficult intubation r/t tracheal stenosis Weaning 30% 5/5 Extubated 1/29 and reintubated Vent dyssynchrony  PRN paralytic and sedation  Extubation vs tracheostomy   Hospital  day # 9   Patient seen and examined by NP/APP with MD. MD to update note as needed.   Cortney E Ernestina Columbiae La Torre , MSN, AGACNP-BC [facility] See Amion for schedule and pager information 09/29/2021 1:45 PM  I have personally obtained history,examined this patient, reviewed notes, independently viewed imaging studies, participated in medical decision making and plan of care.ROS completed by me personally and pertinent positives fully documented  I have made any additions or clarifications directly to the above note. Agree with note above.  Continue ventilatory support for respiratory failure and tracheostomy as per critical care team.  Continue strict blood pressure control with systolic goal below 160.  No family available at the bedside for discussion today.  Discussed with Dr. Denese KillingsAgarwala critical care medicine.This patient is critically ill and at significant risk of neurological worsening, death and  care requires constant monitoring of vital signs, hemodynamics,respiratory and cardiac monitoring, extensive review of multiple databases, frequent neurological assessment, discussion with family, other specialists and medical decision making of high complexity.I have made any additions or clarifications directly to the above note.This critical care time does not reflect procedure time, or teaching time or supervisory time of PA/NP/Med Resident etc but could involve care discussion time.  I spent 30 minutes of neurocritical care time  in the care of  this patient.       Delia Heady, MD Medical Director [facility] Pager: [phone] 09/29/2021 3:23 PM   To contact Stroke Continuity provider, please refer to [email or web address]. After hours, contact General Neurology

## 2021-09-29 NOTE — Progress Notes (Signed)
Inpatient Rehab Admissions Coordinator:   Per PT recommendation,  patient was screened for CIR candidacy by Megan Salon, MS, CCC-SLP  At this time, Pt. is not medically ready for CIR (Pt. Remains on vent). I will not pursue a rehab consult for this Pt. at this time, but CIR admissions team will follow and monitor for medical readiness and place consult order if Pt. appears to be an appropriate candidate. Please contact me with any questions.   Megan Salon, MS, CCC-SLP Rehab Admissions Coordinator  [phone] (celll) [phone] (office)

## 2021-09-29 NOTE — Progress Notes (Signed)
Physical Therapy Treatment Patient Details Name: Caleb Vaughan MRN: [id number] DOB: [birth year] Today's Date: 09/29/2021   History of Present Illness Patient is a 61 y/o male admitted due to R thalamic hemorrhage, edema and trace leftward midline shift, intubated on admission, attempted extubation 1/29, re-intubated that evening.  PMH positive for a-fib on coumadin, HTN, HLD, previous smoker, prior CVA, DM, OSA.    PT Comments    Patient progressing this session with level of arousal, command following and tolerating upright.  Able to sit EOB with A for keeping R hand from pushing and with mod A overall.  Exhibited less coughing and vitals remained stable with SBP max at 158.  Patient remains appropriate for acute inpatient rehab at d/c.  PT will continue to follow.   Recommendations for follow up therapy are one component of a multi-disciplinary discharge planning process, led by the attending physician.  Recommendations may be updated based on patient status, additional functional criteria and insurance authorization.  Follow Up Recommendations  Acute inpatient rehab (3hours/day)     Assistance Recommended at Discharge Frequent or constant Supervision/Assistance  Patient can return home with the following Two people to help with walking and/or transfers;Two people to help with bathing/dressing/bathroom   Equipment Recommendations  Other (comment) (TBA)    Recommendations for Other Services       Precautions / Restrictions Precautions Precautions: Fall Precaution Comments: intubated, coretrack, rectal tube, L hemiparesis     Mobility  Bed Mobility Overal bed mobility: Needs Assistance Bed Mobility: Supine to Sit     Supine to sit: HOB elevated, Max assist, +2 for physical assistance Sit to supine: Total assist, +2 for physical assistance   General bed mobility comments: increased time to engage with attempting to get legs off bed, assist for trunk and legs; to supine pt  leaning R and assist for legs and scooting hips    Transfers                   General transfer comment: NT    Ambulation/Gait                   Stairs             Wheelchair Mobility    Modified Rankin (Stroke Patients Only) Modified Rankin (Stroke Patients Only) Pre-Morbid Rankin Score: No symptoms Modified Rankin: Severe disability     Balance Overall balance assessment: Needs assistance Sitting-balance support: Feet supported Sitting balance-Leahy Scale: Poor Sitting balance - Comments: mod/max A for balance at EOB at times pusing with R UE to L and continuous cues to keep R hand in his lap, sat about 8 minutes working on kicking R leg, relaxing R UE in lap and turning head Postural control: Left lateral lean                                  Cognition Arousal/Alertness: Awake/alert Behavior During Therapy: Flat affect Overall Cognitive Status: Difficult to assess Area of Impairment: Following commands, Attention, Safety/judgement                   Current Attention Level: Focused   Following Commands: Follows one step commands consistently, Follows one step commands with increased time Safety/Judgement: Decreased awareness of deficits, Decreased awareness of safety              Exercises      General Comments General comments (skin integrity,  edema, etc.): 40%'Peep 5; CPAP      Pertinent Vitals/Pain Pain Assessment Pain Assessment: CPOT Facial Expression: Tense Body Movements: Protection Muscle Tension: Tense, rigid Compliance with ventilator (intubated pts.): Tolerating ventilator or movement Vocalization (extubated pts.): N/A CPOT Total: 3 Pain Intervention(s): Monitored during session    Home Living Family/patient expects to be discharged to:: Private residence Living Arrangements: Children (son) Available Help at Discharge: Family;Available PRN/intermittently Type of Home: House Home Access:  Stairs to enter Entrance Stairs-Rails: None Entrance Stairs-Number of Steps: 2 in garage   Home Layout: One level Home Equipment: None Additional Comments: son works nights, mother close, but could only supervise    Prior Function            PT Goals (current goals can now be found in the care plan section) Progress towards PT goals: Progressing toward goals    Frequency           PT Plan Current plan remains appropriate    Co-evaluation PT/OT/SLP Co-Evaluation/Treatment: Yes Reason for Co-Treatment: Complexity of the patient's impairments (multi-system involvement);For patient/therapist safety;Necessary to address cognition/behavior during functional activity PT goals addressed during session: Mobility/safety with mobility;Balance OT goals addressed during session: ADL's and self-care      AM-PAC PT "6 Clicks" Mobility   Outcome Measure  Help needed turning from your back to your side while in a flat bed without using bedrails?: Total Help needed moving from lying on your back to sitting on the side of a flat bed without using bedrails?: Total Help needed moving to and from a bed to a chair (including a wheelchair)?: Total Help needed standing up from a chair using your arms (e.g., wheelchair or bedside chair)?: Total Help needed to walk in hospital room?: Total Help needed climbing 3-5 steps with a railing? : Total 6 Click Score: 6    End of Session Equipment Utilized During Treatment: Oxygen Activity Tolerance: Patient tolerated treatment well Patient left: in bed Nurse Communication: Mobility status PT Visit Diagnosis: Hemiplegia and hemiparesis;Other abnormalities of gait and mobility (R26.89);Muscle weakness (generalized) (M62.81) Hemiplegia - Right/Left: Left Hemiplegia - dominant/non-dominant: Non-dominant Hemiplegia - caused by: Nontraumatic intracerebral hemorrhage     Time: [id number] PT Time Calculation (min) (ACUTE ONLY): 27 min  Charges:   $Therapeutic Activity: 8-22 mins                     Caleb Vaughan, PT Acute Rehabilitation Services [id number] [phone] 09/29/2021    Caleb Vaughan 09/29/2021, 2:06 PM

## 2021-09-29 NOTE — Progress Notes (Signed)
SLP Cancellation Note  Patient Details Name: Caleb Vaughan MRN: [id number] DOB: 1961/05/12  Cancelled treatment:    SLP orders received. Pt now with trach; on the vent.  Will follow along for readiness for PMV/Swallow.  [name] Frederic, MA CCC/SLP Acute Rehabilitation Services Office number [phone] Pager [phone]       [name] 09/29/2021, 3:10 PM

## 2021-09-29 NOTE — Progress Notes (Signed)
[place] Progress Note Patient Name: Caleb Vaughan DOB: [birth year] MRN: [id number]   Date of Service  09/29/2021  HPI/Events of Note  Agitation - Patient getting Fentanyl IV every hour. Nursing request for Fentanyl IV infusion.  eICU Interventions  Plan: Low Dose Fentanyl IV infusion (0-200 mcg/hour). Titrate to RASS = 0.     Intervention Category Major Interventions: Delirium, psychosis, severe agitation - evaluation and management  [name] 09/29/2021, 2:54 AM

## 2021-09-29 NOTE — Procedures (Signed)
Diagnostic Bronchoscopy  Alfonse Garringer  [id number]  [birth year]  Date:09/29/21  Time:2:45 PM   Provider Performing:Brooke Lodema Hong   Procedure: Diagnostic Bronchoscopy (98921)  Indication(s) Assist with direct visualization of tracheostomy placement  Consent Risks of the procedure as well as the alternatives and risks of each were explained to the patient and/or caregiver.  Consent for the procedure was obtained.   Anesthesia See separate tracheostomy note   Time Out Verified patient identification, verified procedure, site/side was marked, verified correct patient position, special equipment/implants available, medications/allergies/relevant history reviewed, required imaging and test results available.   Sterile Technique Usual hand hygiene, masks, gowns, and gloves were used   Procedure Description Bronchoscope advanced through endotracheal tube and into airway.  After suctioning out tracheal secretions, bronchoscope used to provide direct visualization of tracheostomy placement.   Complications/Tolerance None; patient tolerated the procedure well.   EBL None  Specimen(s) None     Posey Boyer, ACNP [facility] 09/29/2021, 2:45 PM

## 2021-09-29 NOTE — Evaluation (Signed)
Occupational Therapy Evaluation Patient Details Name: Caleb Vaughan MRN: [id number] DOB: 1961/07/13 Today's Date: 09/29/2021   History of Present Illness Patient is a 61 y/o male admitted due to R thalamic hemorrhage, edema and trace leftward midline shift, intubated on admission, attempted extubation 1/29, re-intubated that evening.  PMH positive for a-fib on coumadin, HTN, HLD, previous smoker, prior CVA, DM, OSA.   Clinical Impression   PTA pt lives independently with his son/Mom and works at [employer].  Pt seen on vent (settings below) and able to progress to EOB with Max A +2. Following commands and helping with mobility by pushing through his strong RUE. Able to switch yonker on/off and suction his mouth once EOB. Pushing L in sitting, however midline postural control improved once pt's hand placed on his lap. VSS, although pt did demonstrate a drop in systolic BP in sitting as noted in PT note. Currently requires max A +2 with supine to sit and total A with ADL tasks due to deficits listed below. PT reports improved session today. Given PLOF, recommend AIR to maximize functional level of independence to facilitate safe DC home with at least min A of family.      Recommendations for follow up therapy are one component of a multi-disciplinary discharge planning process, led by the attending physician.  Recommendations may be updated based on patient status, additional functional criteria and insurance authorization.   Follow Up Recommendations  Acute inpatient rehab (3hours/day)    Assistance Recommended at Discharge Frequent or constant Supervision/Assistance  Patient can return home with the following      Functional Status Assessment  Patient has had a recent decline in their functional status and demonstrates the ability to make significant improvements in function in a reasonable and predictable amount of time.  Equipment Recommendations  BSC/3in1;Wheelchair (measurements  OT);Wheelchair cushion (measurements OT);Tub/shower bench    Recommendations for Other Services Rehab consult     Precautions / Restrictions Precautions Precautions: Fall Precaution Comments: intubated, coretrack, rectal tube, L hemiparesis      Mobility Bed Mobility Overal bed mobility: Needs Assistance Bed Mobility: Supine to Sit     Supine to sit: HOB elevated, Max assist, +2 for physical assistance Sit to supine: Total assist, +2 for physical assistance   General bed mobility comments: increased time to engage with attempting to get legs off bed, assist for trunk and legs; to supine pt leaning R and assist for legs and scooting hips    Transfers                   General transfer comment: NT      Balance Overall balance assessment: Needs assistance Sitting-balance support: Feet supported Sitting balance-Leahy Scale: Poor Sitting balance - Comments: mod/max A for balance at EOB at times pusing with R UE to L and continuous cues to keep R hand in his lap, sat about 8 minutes working on kicking R leg, relaxing R UE in lap and turning head Postural control: Left lateral lean                                 ADL either performed or assessed with clinical judgement   ADL Overall ADL's : Needs assistance/impaired  Functional mobility during ADLs: Maximal assistance;+2 for physical assistance General ADL Comments: Total A at this itme; Pt helping to suction mouth out with yonker; able to turn suction on/off after demonstration     Vision   Vision Assessment?: Vision impaired- to be further tested in functional context Additional Comments: decreased EOM; will further assess     Perception Perception Comments: ? L inattention; decreased midlein awareness; pushing L   Praxis Praxis Praxis-Other Comments: motor planning difficultie siwht LUE; will assess    Pertinent Vitals/Pain Pain  Assessment Pain Assessment: CPOT Facial Expression: Relaxed, neutral Body Movements: Restlessness Muscle Tension: Tense, rigid Compliance with ventilator (intubated pts.): Coughing but tolerating Vocalization (extubated pts.): N/A CPOT Total: 4 Pain Intervention(s): Limited activity within patient's tolerance     Hand Dominance Right   Extremity/Trunk Assessment Upper Extremity Assessment Upper Extremity Assessment: LUE deficits/detail LUE Deficits / Details: PROM WFL, edematous hand; diffiuclt to assess; moves into an extensor pattern however fist flexed; most likely sensory motor deficits; will continue to assess LUE Sensation: decreased proprioception;decreased light touch LUE Coordination: decreased fine motor;decreased gross motor (not using functionally)   Lower Extremity Assessment Lower Extremity Assessment: Defer to PT evaluation   Cervical / Trunk Assessment Cervical / Trunk Assessment: Other exceptions (L bias/lateral push)   Communication Communication Communication: Other (comment) (Intubated)   Cognition Arousal/Alertness: Awake/alert Behavior During Therapy: Flat affect Overall Cognitive Status: Difficult to assess Area of Impairment: Following commands, Attention, Safety/judgement, Awareness, Problem solving                   Current Attention Level: Focused   Following Commands: Follows one step commands consistently, Follows one step commands with increased time Safety/Judgement: Decreased awareness of deficits, Decreased awareness of safety Awareness: Intellectual Problem Solving: Slow processing General Comments: following commands; PT reports improvement as compared ot session with him yesterday     General Comments  40%'Peep 5; CPAP    Exercises     Shoulder Instructions      Home Living Family/patient expects to be discharged to:: Private residence Living Arrangements: Children (son) Available Help at Discharge: Family;Available  PRN/intermittently Type of Home: House Home Access: Stairs to enter [employer] of Steps: 2 in garage Entrance Stairs-Rails: None Home Layout: One level     Bathroom Shower/Tub: Chief Strategy Officer: Standard     Home Equipment: None   Additional Comments: son works nights, mother close, but could only supervise      Prior Functioning/Environment Prior Level of Function : Independent/Modified Independent;Working/employed             Mobility Comments: working at [employer]          OT Problem List: Decreased strength;Decreased range of motion;Decreased activity tolerance;Impaired balance (sitting and/or standing);Impaired vision/perception;Decreased coordination;Decreased cognition;Decreased safety awareness;Decreased knowledge of use of DME or AE;Cardiopulmonary status limiting activity;Obesity;Impaired tone;Impaired sensation;Impaired UE functional use;Increased edema      OT Treatment/Interventions: Self-care/ADL training;Therapeutic exercise;Neuromuscular education;DME and/or AE instruction;Therapeutic activities;Cognitive remediation/compensation;Visual/perceptual remediation/compensation;Patient/family education;Balance training    OT Goals(Current goals can be found in the care plan section) Acute Rehab OT Goals Patient Stated Goal: pt unable to state; intubated OT Goal Formulation: Patient unable to participate in goal setting Time For Goal Achievement: 10/13/21 Potential to Achieve Goals: Good  OT Frequency: Min 2X/week    Co-evaluation PT/OT/SLP Co-Evaluation/Treatment: Yes Reason for Co-Treatment: Complexity of the patient's impairments (multi-system involvement);For patient/therapist safety;Necessary to address cognition/behavior during functional activity PT goals addressed during session: Mobility/safety with  mobility;Balance OT goals addressed during session: ADL's and self-care      AM-PAC OT "6 Clicks" Daily Activity      Outcome Measure Help from another person eating meals?: Total Help from another person taking care of personal grooming?: Total Help from another person toileting, which includes using toliet, bedpan, or urinal?: Total Help from another person bathing (including washing, rinsing, drying)?: Total Help from another person to put on and taking off regular upper body clothing?: Total Help from another person to put on and taking off regular lower body clothing?: Total 6 Click Score: 6   End of Session Equipment Utilized During Treatment: Other (comment) (vent) Nurse Communication: Mobility status  Activity Tolerance: Patient tolerated treatment well Patient left: in bed;with call bell/phone within reach;with bed alarm set;with restraints reapplied;with SCD's reapplied  OT Visit Diagnosis: Unsteadiness on feet (R26.81);Other abnormalities of gait and mobility (R26.89);Muscle weakness (generalized) (M62.81);Other symptoms and signs involving the nervous system (R29.898);Other symptoms and signs involving cognitive function;Hemiplegia and hemiparesis Hemiplegia - Right/Left: Left Hemiplegia - dominant/non-dominant: Non-Dominant Hemiplegia - caused by: Nontraumatic intracerebral hemorrhage                Time: [id number] OT Time Calculation (min): 27 min Charges:  OT General Charges $OT Visit: 1 Visit OT Evaluation $OT Eval High Complexity: 1 High  Khaya Theissen, OT/L   Acute OT Clinical Specialist Acute Rehabilitation Services Pager [phone] Office [phone]   [facility] CampusWARD,HILLARY 09/29/2021, 1:44 PM

## 2021-09-29 NOTE — Plan of Care (Signed)
°  Problem: Safety: Goal: Non-violent Restraint(s) Outcome: Progressing   Problem: Respiratory: Goal: Patent airway maintenance will improve Outcome: Progressing   Problem: Coping: Goal: Will identify appropriate support needs Outcome: Not Progressing   Problem: Self-Care: Goal: Ability to participate in self-care as condition permits will improve Outcome: Not Progressing

## 2021-09-30 DIAGNOSIS — I619 Nontraumatic intracerebral hemorrhage, unspecified: Secondary | ICD-10-CM | POA: Diagnosis not present

## 2021-09-30 DIAGNOSIS — J9601 Acute respiratory failure with hypoxia: Secondary | ICD-10-CM | POA: Diagnosis not present

## 2021-09-30 LAB — CBC
HCT: 33.8 % — ABNORMAL LOW (ref 39.0–52.0)
Hemoglobin: 11.3 g/dL — ABNORMAL LOW (ref 13.0–17.0)
MCH: 30.6 pg (ref 26.0–34.0)
MCHC: 33.4 g/dL (ref 30.0–36.0)
MCV: 91.6 fL (ref 80.0–100.0)
Platelets: 259 10*3/uL (ref 150–400)
RBC: 3.69 MIL/uL — ABNORMAL LOW (ref 4.22–5.81)
RDW: 12.8 % (ref 11.5–15.5)
WBC: 11.9 10*3/uL — ABNORMAL HIGH (ref 4.0–10.5)
nRBC: 0 % (ref 0.0–0.2)

## 2021-09-30 LAB — BASIC METABOLIC PANEL
Anion gap: 10 (ref 5–15)
BUN: 36 mg/dL — ABNORMAL HIGH (ref 6–20)
CO2: 20 mmol/L — ABNORMAL LOW (ref 22–32)
Calcium: 8.9 mg/dL (ref 8.9–10.3)
Chloride: 111 mmol/L (ref 98–111)
Creatinine, Ser: 1.09 mg/dL (ref 0.61–1.24)
GFR, Estimated: >60 mL/min (ref >60)
Glucose, Bld: 132 mg/dL — ABNORMAL HIGH (ref 70–99)
Potassium: 3.6 mmol/L (ref 3.5–5.1)
Sodium: 141 mmol/L (ref 135–145)

## 2021-09-30 LAB — CULTURE, BLOOD (ROUTINE X 2)
Culture: NO GROWTH
Culture: NO GROWTH
Special Requests: ADEQUATE
Special Requests: ADEQUATE

## 2021-09-30 LAB — URINALYSIS, MICROSCOPIC (REFLEX)

## 2021-09-30 LAB — URINALYSIS, ROUTINE W REFLEX MICROSCOPIC
Bilirubin Urine: NEGATIVE
Glucose, UA: NEGATIVE mg/dL
Ketones, ur: NEGATIVE mg/dL
Nitrite: NEGATIVE
Protein, ur: 100 mg/dL — AB
Specific Gravity, Urine: 1.025 (ref 1.005–1.030)
pH: 5.5 (ref 5.0–8.0)

## 2021-09-30 LAB — GLUCOSE, CAPILLARY
Glucose-Capillary: 117 mg/dL — ABNORMAL HIGH (ref 70–99)
Glucose-Capillary: 132 mg/dL — ABNORMAL HIGH (ref 70–99)
Glucose-Capillary: 144 mg/dL — ABNORMAL HIGH (ref 70–99)
Glucose-Capillary: 153 mg/dL — ABNORMAL HIGH (ref 70–99)
Glucose-Capillary: 161 mg/dL — ABNORMAL HIGH (ref 70–99)
Glucose-Capillary: 200 mg/dL — ABNORMAL HIGH (ref 70–99)

## 2021-09-30 LAB — MAGNESIUM: Magnesium: 2.6 mg/dL — ABNORMAL HIGH (ref 1.7–2.4)

## 2021-09-30 MED ORDER — CLONAZEPAM 0.5 MG PO TABS
0.5000 mg | ORAL_TABLET | Freq: Two times a day (BID) | ORAL | Status: DC | PRN
  Administered 2021-10-01 – 2021-10-04 (×5): 0.5 mg
  Filled 2021-09-30 (×6): qty 1

## 2021-09-30 MED ORDER — POTASSIUM CHLORIDE 20 MEQ PO PACK
40.0000 meq | PACK | Freq: Once | ORAL | Status: AC
  Administered 2021-09-30: 40 meq
  Filled 2021-09-30: qty 2

## 2021-09-30 MED ORDER — SODIUM CHLORIDE 0.9 % IV SOLN
2.0000 g | INTRAVENOUS | Status: AC
  Administered 2021-09-30 – 2021-10-04 (×5): 2 g via INTRAVENOUS
  Filled 2021-09-30 (×7): qty 20

## 2021-09-30 MED ORDER — FENTANYL CITRATE PF 50 MCG/ML IJ SOSY
50.0000 ug | PREFILLED_SYRINGE | INTRAMUSCULAR | Status: DC | PRN
  Administered 2021-10-01: 50 ug via INTRAVENOUS
  Filled 2021-09-30: qty 1

## 2021-09-30 NOTE — Progress Notes (Signed)
Physical Therapy Treatment Patient Details Name: Caleb Vaughan MRN: [id number] DOB: 1960-10-11 Today's Date: 09/30/2021   History of Present Illness Patient is a 61 y/o male admitted due to R thalamic hemorrhage, edema and trace leftward midline shift, intubated on admission, attempted extubation 1/29, re-intubated that evening.  PMH positive for a-fib on coumadin, HTN, HLD, previous smoker, prior CVA, DM, OSA.    PT Comments    Patient more lethargic today and more inconsistent with command following.  Seemed fatigued faster and pushing more with fatigue.  Patient now s/p tracheostomy and seemed more comfortable than with ETT.  No active movement on L LE today with ROM activities, but did pull L UE towards him x 1 during ROM activities.  Feel continued skilled PT indicated to address deficits and maximize mobility and safety.  Remains appropriate for acute inpatient rehab at d/c.    Recommendations for follow up therapy are one component of a multi-disciplinary discharge planning process, led by the attending physician.  Recommendations may be updated based on patient status, additional functional criteria and insurance authorization.  Follow Up Recommendations  Acute inpatient rehab (3hours/day)     Assistance Recommended at Discharge Frequent or constant Supervision/Assistance  Patient can return home with the following Two people to help with walking and/or transfers;Two people to help with bathing/dressing/bathroom   Equipment Recommendations  Other (comment) (TBA)    Recommendations for Other Services       Precautions / Restrictions Precautions Precautions: Fall Precaution Comments: intubated, coretrack, rectal tube, L hemiparesis Restrictions Weight Bearing Restrictions: No     Mobility  Bed Mobility Overal bed mobility: Needs Assistance Bed Mobility: Rolling, Sidelying to Sit Rolling: Max assist Sidelying to sit: Max assist, +2 for safety/equipment, HOB elevated    Sit to supine: Total assist, +2 for physical assistance   General bed mobility comments: used bed pad to help roll then side to sit with assist for legs and trunk, to supine total A for all aspects    Transfers                        Ambulation/Gait                   Stairs             Wheelchair Mobility    Modified Rankin (Stroke Patients Only) Modified Rankin (Stroke Patients Only) Pre-Morbid Rankin Score: No symptoms Modified Rankin: Severe disability     Balance Overall balance assessment: Needs assistance Sitting-balance support: Feet supported Sitting balance-Leahy Scale: Poor Sitting balance - Comments: mod/max A for sitting balance, pushing to L at times with R UE Postural control: Left lateral lean                                  Cognition Arousal/Alertness: Lethargic Behavior During Therapy: Flat affect Overall Cognitive Status: Difficult to assess Area of Impairment: Following commands, Safety/judgement, Attention, Problem solving                   Current Attention Level: Focused   Following Commands: Follows one step commands with increased time, Follows one step commands inconsistently Safety/Judgement: Decreased awareness of deficits, Decreased awareness of safety   Problem Solving: Slow processing General Comments: more lethargic today after trach yesterday, but following one step commands intermittently; demonstrated fatigue pushing further over to L  Exercises Total Joint Exercises Ankle Circles/Pumps: PROM, 5 reps, Left, Supine Heel Slides: PROM, 5 reps, Left, Supine Hip ABduction/ADduction: PROM, Left, 5 reps, Supine Long Arc Quad: AAROM, PROM, Both, 5 reps, Seated    General Comments General comments (skin integrity, edema, etc.): RT in to assist as pt on weaning mode on vent via trach, but they suctioned him prior to mobilizing and after return to supine with obvious distress improved  after suctioning and HOB up      Pertinent Vitals/Pain Pain Assessment Facial Expression: Relaxed, neutral Body Movements: Protection Muscle Tension: Relaxed Compliance with ventilator (intubated pts.): Tolerating ventilator or movement Vocalization (extubated pts.): N/A CPOT Total: 1    Home Living                          Prior Function            PT Goals (current goals can now be found in the care plan section) Progress towards PT goals: Not progressing toward goals - comment    Frequency    Min 4X/week      PT Plan Current plan remains appropriate    Co-evaluation              AM-PAC PT "6 Clicks" Mobility   Outcome Measure  Help needed turning from your back to your side while in a flat bed without using bedrails?: Total Help needed moving from lying on your back to sitting on the side of a flat bed without using bedrails?: Total Help needed moving to and from a bed to a chair (including a wheelchair)?: Total Help needed standing up from a chair using your arms (e.g., wheelchair or bedside chair)?: Total Help needed to walk in hospital room?: Total Help needed climbing 3-5 steps with a railing? : Total 6 Click Score: 6    End of Session   Activity Tolerance: Patient limited by fatigue Patient left: in bed;with bed alarm set;with family/visitor present   PT Visit Diagnosis: Hemiplegia and hemiparesis;Other abnormalities of gait and mobility (R26.89);Muscle weakness (generalized) (M62.81) Hemiplegia - Right/Left: Left Hemiplegia - dominant/non-dominant: Non-dominant Hemiplegia - caused by: Nontraumatic intracerebral hemorrhage     Time: [id number] PT Time Calculation (min) (ACUTE ONLY): 23 min  Charges:  $Therapeutic Activity: 23-37 mins                     Magda Kiel, PT Acute Rehabilitation Services [id number] Office:[phone] 09/30/2021    [name] 09/30/2021, 11:09 AM

## 2021-09-30 NOTE — Progress Notes (Signed)
NAME:  Caleb Vaughan, MRN:  [id number], DOB:  1960/09/10, LOS: 19 ADMISSION DATE:  09/20/2021, CONSULTATION DATE:  09/20/2021 REFERRING MD:  Dr. Carren Rang, CHIEF COMPLAINT:  Hemorrhagic stroke    History of Present Illness:  Caleb Vaughan is a 61 y.o. male with PMH listed below  who presented to the emergency department 1/22 as a code stroke, last known normal 2 AM.   Stroke CT on admit revealed, positive for acute right thalamic hemorrhage with intraventricular extension with estimated intra-axial blood volume 25 mL. Mild Regional brain edema. Trace leftward midline shift. Patient was intubated in ED and PCCM was consulted for further assessment.   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 Intubated in ED for increasing somnolence and inability to protect airway.  1/24 increased respiratory distress requiring increased sedation and intermittent neuromuscular blockade.  Improved with diuresis.  3% saline stopped for increasing sodium above 159 1/29 Continue to tolerated wean but gets agitated with decreased sedation, failed extubation 2/2 secretions, foley placed for urinary retention, tmax 102 1/30 PICC removed, increased bethanechol, weaned x 12 hrs  1/31 trached   Interim History / Subjective:  No events overnight   Objective   Blood pressure (!) 141/60, pulse 85, temperature 98.9 F (37.2 C), temperature source Oral, resp. rate 19, height 5\' 4"  (1.626 m), weight 92.6 kg, SpO2 99 %.    Vent Mode: PRVC FiO2 (%):  [40 %-100 %] 40 % Set Rate:  [16 bmp] 16 bmp Vt Set:  [470 mL] 470 mL PEEP:  [5 cmH20] 5 cmH20 Pressure Support:  [5 cmH20] 5 cmH20 Plateau Pressure:  [10 [id number]  cmH20] 13 cmH20   Intake/Output Summary (Last 24 hours) at 09/30/2021 0735 Last data filed at 09/30/2021 0700 Gross per 24 hour  Intake 2278.85 ml  Output 2080 ml  Net 198.85 ml   Filed Weights   09/25/21 0500 09/27/21 0500 09/30/21 0126  Weight: 95 kg 94.7 kg 92.6 kg   Examination: Off fentanyl gtt since ~ 0500 General:  Adult male lying in bed in NAD HEENT: MM pink/moist, pupils 3/sluggish, anicteric, sutured trach- shiley XLT proximal, left nare cortrak  Neuro: does not open eyes, follows commands in RUE and RLL, left hemiplegia  CV: rr, NSR, no murmur PULM:  non labored, currently on PSV 5/5, coarse more on R, otherwise clear, some residual bloody secretions GI: soft, bs+, foley, rectal pouch Extremities: warm/dry, no LE edema  Skin: no rashes  UOP 1.8L/ 24hrs Net +6.2L    Ancillary tests   1/23 MRI >  Stable right thalamic hemorrhage since presentation. Regional edema, tracking into the midbrain. 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 No increased intracranial mass effect. 1/23 MRA>  Abundant chronic micro-hemorrhages in the brain superimposed on chronic lacunar infarcts compatible with advanced small vessel disease 1/23 TTE> LVEF 60-65, no wall abnormalities, normal RV 1/27 CT head: No progression r thalamocapsular hematoma with intraventricular extension. No hydrocephalus, or mass effect   Assessment & Plan:  Nontraumatic thalamic hemorrhage Cerebral edema  -Neuro suspects secondary to HTN in the setting of coumadin use. CT head: No progression r thalamocapsular hematoma with intraventricular extension. No hydrocephalus, or mass effect. 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  Goal is to get to [facility]  - VAP /PPI  - prn BD, ongoing aggressive pulmonary hygiene  - PAD protocol > prn fentanyl, continue scheduled oxy and add prn klonopin  - trach care per protocol - may need PEG prior to discharge.  Continue cortrak for now.  Hopeful we can get him to [facility] then pending SLP prior to deciding on PEG.   Paroxysmal afib HX HTN, HLD P: - tele monitoring, remains in NSR - cont flecainide - continue norvasc, coreg, hydralazine with prn's  - goal K > 4, Mag > 2  Acute hypernatremia, resolved  -iatrogenic  Hypokelemia -3% stopped on 1/25-26 P: - cont. free water  - Trend BMET  DMII P: - cont SSI resistant, TF coverage 5units q4, and levemir 27 units BID    - CBG goal < 180  Urinary retention - continue foley, reinserted 1/29.  This is his third foley due to retention.  - increase bethanechol 1/30 - will likely need urology input   Fever Leukocytosis  - Suspected from aspiration event 1/29 vs central.  WBC improving 1/31.  Fever x 1 overnight likely reactive from trach procedure.  Today afebrile - repeat BC from 1/27 ngtd - continue to monitor clinically off abx  Best Practice (right click and "Reselect all SmartList Selections" daily)   Diet/type: tubefeeds, w/ fiber  DVT prophylaxis: SCD and heparin [place]  GI prophylaxis: PPI Lines: N/A Foley:  Yes, and it is still needed Code Status:  full code Last date of multidisciplinary goals of care discussion:  ongoing  CCT 30 mins    Kennieth Rad, ACNP  Pulmonary & Critical Care 09/30/2021, 7:35 AM

## 2021-09-30 NOTE — Progress Notes (Addendum)
STROKE TEAM PROGRESS NOTE   INTERVAL HISTORY Patient is seen in his room with his uncle at the bedside.  He received a tracheostomy yesterday.  The procedure was uneventful.  He has been hemodynamically stable and his neurological exam is unchanged. He is currently being weaned off ventilatory support as of this morning.  Remains sedated but is arousable and follows commands.  Blood pressure adequately controlled. Vitals:   09/30/21 1100 09/30/21 1138 09/30/21 1200 09/30/21 1205  BP: (!) 111/56  (!) 146/73 (!) 146/73  Pulse: 74  80 82  Resp: (!) 21  (!) 25 13  Temp:  100.1 F (37.8 C)    TempSrc:  Axillary    SpO2: 94%  96% 95%  Weight:      Height:       CBC:  Recent Labs  Lab 09/27/21 0936 09/27/21 1927 09/29/21 0358 09/30/21 0707  WBC 11.7*   < > 10.6* 11.9*  NEUTROABS 8.7*  --   --   --   HGB 12.5*   < > 11.3* 11.3*  HCT 38.8*   < > 34.3* 33.8*  MCV 94.9   < > 91.7 91.6  PLT 228   < > 224 259   < > = values in this interval not displayed.    Basic Metabolic Panel:  Recent Labs  Lab 09/26/21 0429 09/26/21 1227 09/29/21 0358 09/30/21 0707  NA 154*   < > 140 141  K 3.4*   < > 3.9 3.6  CL 125*   < > 111 111  CO2 22   < > 20* 20*  GLUCOSE 199*   < > 122* 132*  BUN 33*   < > 32* 36*  CREATININE 1.09   < > 0.99 1.09  CALCIUM 9.2   < > 8.9 8.9  MG 2.7*  --  2.6* 2.6*  PHOS 3.4  --  3.3  --    < > = values in this interval not displayed.    Lipid Panel:  No results for input(s): CHOL, TRIG, HDL, CHOLHDL, VLDL, LDLCALC in the last 168 hours.  HgbA1c: No results for input(s): HGBA1C in the last 168 hours.  Urine Drug Screen:  No results for input(s): LABOPIA, COCAINSCRNUR, LABBENZ, AMPHETMU, THCU, LABBARB in the last 168 hours.   Alcohol Level No results for input(s): ETH in the last 168 hours.  IMAGING past 24 hours DG Chest Port 1 View  Result Date: 09/29/2021 CLINICAL DATA:  Tracheostomy present EXAM: PORTABLE CHEST 1 VIEW.  Patient is rotated COMPARISON:   Chest x-ray 09/27/2021 FINDINGS: Tracheostomy with tip terminating approximately 2 cm above the carina. Enteric tube coursing below the hemidiaphragm with tip collimated off view. The heart and mediastinal contours are unchanged. Slightly more prominent hilar vasculature. Interval development of linear airspace opacity overlying the right mid lung zone. Slight increased bilateral lower lung zone patchy airspace opacities. No pulmonary edema. No pleural effusion. No pneumothorax. No acute osseous abnormality. IMPRESSION: 1. Slight increased bilateral lower lung zone patchy airspace opacities. Finding may represent a combination of atelectasis versus infection/inflammation. Followup PA and lateral chest X-ray is recommended in 3-4 weeks following therapy to ensure resolution. 2. Pulmonary venous congestion. Electronically Signed   By: Iven Finn M.D.   On: 09/29/2021 15:18    PHYSICAL EXAM  Physical Exam  Constitutional: Appears well-developed and well-nourished.  Middle-aged Caucasian male not in distress.  Status post tracheostomy Cardiovascular: Normal rate and regular rhythm.  Respiratory: Respirations synchronous with ventilator  Neuro:  s/p tracheostomyon sedation with fentanyl.  Easily arousable and follows.  Left pupil larger than right pupil, both reactive to light.  Right gaze preference.  Will follow commands with RUE and RLE, no movement of LUE and LLE  ASSESSMENT/PLAN [name] is a 61 y.o. male with history of AFib on Coumadin s/p recent conversion, HTN, HLD, remote tobacco abuse, Cataract OU, glaucoma, obesity, CVA, DM II, and OSA who initially presented to the ED via EMS left sided weakness, dysarthria, and right fixed gaze. Coumadin reversed. Labetalol and cleviprex used for BP greater than 140 during CT. Patient was then intubated due to somnolence and inability to protect his airway. CT and MRI show stable right thalamic hemorrhage with intraventricular extension. ICH  score 2. Febrile. Hypertonic saline discontinued 1/23. Most recent sodium is 155.  Currently in NSR with flecainide continued. Holding home antihypertensive medications currently. No longer using cleviprex or norepinephrine (r/t hypotension after RSI) for BP control. Repeat CT shows no significant change in the hemorrhage or ventricle size. Weaning 1/28 and 1/29 at 30% , 5/5. Extubated 1/29 AM and reintubated 1/29 PM due to copious secretions.  Tracheostomy was placed 1/31.  Stroke:  Acute right thalamic ICH and IVH likely secondary to hypertension in the setting of coumadin use Code stroke CT - Positive for acute right thalamic hemorrhage with intraventricular extension. Estimated intra-axial blood volume 25 mL. Mild Regional brain edema. Trace leftward midline shift. 1/22 Repeat CT shows no changes from code stroke CT MRI  Stable right thalamic hemorrhage since presentation. Regional edema, tracking into the midbrain. 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 No increased intracranial mass effect. MRA  Abundant chronic micro-hemorrhages in the brain superimposed on chronic lacunar infarcts compatible with advanced small vessel disease CT head 1/27 - unchanged hematoma and ventricular size Carotid Doppler  1-39% stenosis in Left and Right ICA 2D Echo EF 60-65% LDL 52 HgbA1c 9.7 VTE prophylaxis - heparin warfarin daily prior to admission, now on No antithrombotic. ICH Therapy recommendations:  CIR Disposition:  pending  Cerebral Edema with trace left midline shift Stable MRI and repeat CT scan Hypertonic saline discontinued Free water flushes given 200cc->400cc q4h NA- 150-> 154-> 156-> 155 -> 157 -> 164->158-> 154->148-> 143-> 141  Atrial Fibrillation Home meds: Flecainide, coreg, coumadin Flecainide continued, patient is in NSR currently  Fever ? Central fever Blood culture 1/2 staph epidermidis Repeat blood culture no growth  Hypertension Home meds:  Amlodipine,  coreg, losartan-hydrochlorothiazide Restart gradually BP goal less than 160 BP labile post intubation, levophed d/c'd  Hyperlipidemia Home meds: Lipitor 80 LDL 52, goal < 70 Consider to resume Lipitor on discharge  Diabetes type II Uncontrolled Home meds:  Metformin, jardiance HgbA1c 9.7 , goal < 7.0 CBGs SSI Close PCP follow-up as outpatient for better DM control  Other Stroke Risk Factors Cigarette smoker, advised to stop smoking ETOH use, advised to drink no more than 2 drink(s) a day Obesity, Body mass index is 35.04 kg/m., BMI >/= 30 associated with increased stroke risk, recommend weight loss, diet and exercise as appropriate  Obstructive sleep apnea  Other Active Problems Acute hypoxemic respiratory failure CCM consult for intubation and ventilator management Sedation with propofol and fentanyl Monitor triglycerides Difficult intubation r/t tracheal stenosis Weaning 30% 5/5 Extubated 1/29 and reintubated Tracheostomy 1/31 Vent dyssynchrony  PRN sedation    Hospital day # 10   Patient seen and examined by NP/APP with MD. MD to update note as needed.   Cortney  Golf , MSN, AGACNP-BC [facility] See Amion for schedule and pager information 09/30/2021 12:58 PM I have personally obtained history,examined this patient, reviewed notes, independently viewed imaging studies, participated in medical decision making and plan of care.ROS completed by me personally and pertinent positives fully documented  I have made any additions or clarifications directly to the above note. Agree with note above.  Patient remains on ventilatory support for respiratory failure.  Continue weaning off ventilator as tolerated as per critical care team.  Will likely need PEG tube placement for feeding and nutrition as well for the next few days.  Long discussion with patient's uncle and his wife at the bedside and answered questions.  Discussed with Dr. Lynetta Mare critical care  medicine.This patient is critically ill and at significant risk of neurological worsening, death and care requires constant monitoring of vital signs, hemodynamics,respiratory and cardiac monitoring, extensive review of multiple databases, frequent neurological assessment, discussion with family, other specialists and medical decision making of high complexity.I have made any additions or clarifications directly to the above note.This critical care time does not reflect procedure time, or teaching time or supervisory time of PA/NP/Med Resident etc but could involve care discussion time.  I spent 30 minutes of neurocritical care time  in the care of  this patient.      Antony Contras, MD Medical Director [facility] Pager: [phone] 09/30/2021 2:48 PM  To contact Stroke Continuity provider, please refer to [email or web address] After hours, contact General Neurology

## 2021-09-30 NOTE — Progress Notes (Signed)
SLP Cancellation Note  Patient Details Name: Jamyson Mitcham MRN: [id number] DOB: 18-Aug-1961   Cancelled treatment:       Reason Eval/Treat Not Completed: Medical issues which prohibited therapy (Pt remains on teh vent at this time. SLP will follow up.)  Malisha Mabey I. Hardin Negus, Ricardo, [place] Office number [phone] Pager [place] 09/30/2021, 8:08 AM

## 2021-09-30 NOTE — Progress Notes (Signed)
RT assisted PT with patient therapy while on the vent.

## 2021-10-01 ENCOUNTER — Encounter (HOSPITAL_COMMUNITY): Payer: Self-pay | Admitting: Neurology

## 2021-10-01 ENCOUNTER — Inpatient Hospital Stay (HOSPITAL_COMMUNITY): Payer: No Typology Code available for payment source

## 2021-10-01 DIAGNOSIS — R509 Fever, unspecified: Secondary | ICD-10-CM | POA: Diagnosis not present

## 2021-10-01 DIAGNOSIS — I619 Nontraumatic intracerebral hemorrhage, unspecified: Secondary | ICD-10-CM | POA: Diagnosis not present

## 2021-10-01 DIAGNOSIS — J9601 Acute respiratory failure with hypoxia: Secondary | ICD-10-CM | POA: Diagnosis not present

## 2021-10-01 DIAGNOSIS — D72829 Elevated white blood cell count, unspecified: Secondary | ICD-10-CM

## 2021-10-01 DIAGNOSIS — Z930 Tracheostomy status: Secondary | ICD-10-CM

## 2021-10-01 LAB — BASIC METABOLIC PANEL
Anion gap: 9 (ref 5–15)
BUN: 27 mg/dL — ABNORMAL HIGH (ref 6–20)
CO2: 20 mmol/L — ABNORMAL LOW (ref 22–32)
Calcium: 8.9 mg/dL (ref 8.9–10.3)
Chloride: 111 mmol/L (ref 98–111)
Creatinine, Ser: 0.95 mg/dL (ref 0.61–1.24)
GFR, Estimated: >60 mL/min (ref >60)
Glucose, Bld: 158 mg/dL — ABNORMAL HIGH (ref 70–99)
Potassium: 3.5 mmol/L (ref 3.5–5.1)
Sodium: 140 mmol/L (ref 135–145)

## 2021-10-01 LAB — CBC WITH DIFFERENTIAL/PLATELET
Abs Immature Granulocytes: 0.1 10*3/uL — ABNORMAL HIGH (ref 0.00–0.07)
Basophils Absolute: 0 10*3/uL (ref 0.0–0.1)
Basophils Relative: 0 %
Eosinophils Absolute: 0.3 10*3/uL (ref 0.0–0.5)
Eosinophils Relative: 2 %
HCT: 32.7 % — ABNORMAL LOW (ref 39.0–52.0)
Hemoglobin: 11.1 g/dL — ABNORMAL LOW (ref 13.0–17.0)
Immature Granulocytes: 1 %
Lymphocytes Relative: 7 %
Lymphs Abs: 1 10*3/uL (ref 0.7–4.0)
MCH: 30.7 pg (ref 26.0–34.0)
MCHC: 33.9 g/dL (ref 30.0–36.0)
MCV: 90.6 fL (ref 80.0–100.0)
Monocytes Absolute: 1.5 10*3/uL — ABNORMAL HIGH (ref 0.1–1.0)
Monocytes Relative: 10 %
Neutro Abs: 11.4 10*3/uL — ABNORMAL HIGH (ref 1.7–7.7)
Neutrophils Relative %: 80 %
Platelets: 257 10*3/uL (ref 150–400)
RBC: 3.61 MIL/uL — ABNORMAL LOW (ref 4.22–5.81)
RDW: 12.7 % (ref 11.5–15.5)
WBC: 14.2 10*3/uL — ABNORMAL HIGH (ref 4.0–10.5)
nRBC: 0 % (ref 0.0–0.2)

## 2021-10-01 LAB — GLUCOSE, CAPILLARY
Glucose-Capillary: 172 mg/dL — ABNORMAL HIGH (ref 70–99)
Glucose-Capillary: 128 mg/dL — ABNORMAL HIGH (ref 70–99)
Glucose-Capillary: 138 mg/dL — ABNORMAL HIGH (ref 70–99)
Glucose-Capillary: 135 mg/dL — ABNORMAL HIGH (ref 70–99)
Glucose-Capillary: 146 mg/dL — ABNORMAL HIGH (ref 70–99)
Glucose-Capillary: 142 mg/dL — ABNORMAL HIGH (ref 70–99)

## 2021-10-01 MED ORDER — POTASSIUM CHLORIDE 20 MEQ PO PACK
40.0000 meq | PACK | Freq: Two times a day (BID) | ORAL | Status: AC
  Administered 2021-10-01 (×2): 40 meq
  Filled 2021-10-01 (×2): qty 2

## 2021-10-01 MED ORDER — LOPERAMIDE HCL 1 MG/7.5ML PO SUSP
4.0000 mg | Freq: Once | ORAL | Status: AC
  Administered 2021-10-01: 4 mg
  Filled 2021-10-01: qty 30

## 2021-10-01 MED ORDER — LOPERAMIDE HCL 1 MG/7.5ML PO SUSP
2.0000 mg | ORAL | Status: DC | PRN
  Administered 2021-10-01 – 2021-10-04 (×2): 2 mg
  Filled 2021-10-01 (×4): qty 15

## 2021-10-01 NOTE — Consult Note (Signed)
Caleb Vaughan 1960/12/06  [id number].    Requesting MD: Dr. Antony Contras Chief Complaint/Reason for Consult: CVA, dysphagia, needs PEG  HPI:  This is a 61 yo male with a history of a fib on coumadin s/p recent conversion, HTN, HLD, tobacco abuse, obesity, CVA, DM, and OSA who presented to the ED secondary to left sided weakness.  He was found to have a R thalamic hemorrhage with intraventricular extension.  He was intubated and his coumadin reversed.  He remains on the ventilator and was trached on 1/31 as he failed his first attempt at extubation on 1/29.  Request has been made for PEG tube placement for feeding access given his persistent deficits.  ROS: ROS: on vent.  Unable.  History reviewed. No pertinent family history.  History reviewed. No pertinent past medical history.  History reviewed. No pertinent surgical history.  Social History:  has no history on file for tobacco use, alcohol use, and drug use.  Allergies:  Allergies  Allergen Reactions   Apixaban Anaphylaxis    Other reaction(s): Myalgias (intolerance)   Iodinated Contrast Media Hives   Shellfish Allergy Itching and Hives    Throat feels a little scratchy    Medications Prior to Admission  Medication Sig Dispense Refill   amLODipine (NORVASC) 10 MG tablet Take 10 mg by mouth daily.     atorvastatin (LIPITOR) 80 MG tablet Take 80 mg by mouth daily.     carvedilol (COREG) 25 MG tablet Take 25 mg by mouth 2 (two) times daily.     flecainide (TAMBOCOR) 150 MG tablet Take 150 mg by mouth 2 (two) times daily.     JARDIANCE 25 MG TABS tablet Take 25 mg by mouth daily.     LEVEMIR FLEXTOUCH 100 UNIT/ML FlexTouch Pen Inject 25-35 Units into the skin See admin instructions. Inject 35 units every morning and 35 units every evening. Max of 100 units w/n a day.     losartan-hydrochlorothiazide (HYZAAR) 100-25 MG tablet Take 1 tablet by mouth daily.     metFORMIN (GLUCOPHAGE) 1000 MG tablet Take 1,000 mg by mouth 2  (two) times daily.     warfarin (COUMADIN) 5 MG tablet Take 10-15 mg by mouth See admin instructions. Take 2 tablets QD, except for Thursdays and Saturdays pt takes 3 tabs or as directed by [facility].       Physical Exam: Blood pressure 140/63, pulse 81, temperature 99.8 F (37.7 C), temperature source Axillary, resp. rate (!) 29, height 5\' 4"  (1.626 m), weight 92.6 kg, SpO2 96 %. General: ill-appearing,WD, WN white male who is laying in bed in NAD HEENT: head is normocephalic, atraumatic.  Sclera are noninjected.  L pupil is slightly larger than right, but both equally reactive.  Ears and nose without any masses or lesions, PANDA in place.  Mouth is pink.  Neck is supple.  Trachea midline with tracheostomy in place. Heart: regular, rate, and rhythm.  Normal s1,s2. No obvious murmurs, gallops, or rubs noted.  Palpable radial and pedal pulses bilaterally Lungs: CTAB, no wheezes, rhonchi, or rales noted.  Respiratory effort nonlabored on trach collar Abd: soft, seems NT, ND, +BS, no masses, hernias, or organomegaly MS: flaccid LUE and LLE.  No cyanosis or clubbing.   Skin: warm and dry with no masses, lesions, or rashes Neuro: flaccid left side, moves right side, does follow commands on right side.  Gave me a thumbs up spontaneously when I told him about a g-tube placement. Psych: unable due  to limited interaction ability   Results for orders placed or performed during the hospital encounter of 09/20/21 (from the past 48 hour(s))  Glucose, capillary     Status: Abnormal   Collection Time: 09/29/21  3:40 PM  Result Value Ref Range   Glucose-Capillary 163 (H) 70 - 99 mg/dL    Comment: Glucose reference range applies only to samples taken after fasting for at least 8 hours.  Glucose, capillary     Status: Abnormal   Collection Time: 09/29/21  7:20 PM  Result Value Ref Range   Glucose-Capillary 164 (H) 70 - 99 mg/dL    Comment: Glucose reference range applies only to samples taken after  fasting for at least 8 hours.  Glucose, capillary     Status: Abnormal   Collection Time: 09/29/21 11:43 PM  Result Value Ref Range   Glucose-Capillary 130 (H) 70 - 99 mg/dL    Comment: Glucose reference range applies only to samples taken after fasting for at least 8 hours.  Glucose, capillary     Status: Abnormal   Collection Time: 09/30/21  3:07 AM  Result Value Ref Range   Glucose-Capillary 132 (H) 70 - 99 mg/dL    Comment: Glucose reference range applies only to samples taken after fasting for at least 8 hours.  CBC     Status: Abnormal   Collection Time: 09/30/21  7:07 AM  Result Value Ref Range   WBC 11.9 (H) 4.0 - 10.5 K/uL   RBC 3.69 (L) 4.22 - 5.81 MIL/uL   Hemoglobin 11.3 (L) 13.0 - 17.0 g/dL   HCT 33.8 (L) 39.0 - 52.0 %   MCV 91.6 80.0 - 100.0 fL   MCH 30.6 26.0 - 34.0 pg   MCHC 33.4 30.0 - 36.0 g/dL   RDW 12.8 11.5 - 15.5 %   Platelets 259 150 - 400 K/uL   nRBC 0.0 0.0 - 0.2 %    Comment: Performed at [facility], [place] [address] [place], [place] [id number]  Basic metabolic panel     Status: Abnormal   Collection Time: 09/30/21  7:07 AM  Result Value Ref Range   Sodium 141 135 - 145 mmol/L   Potassium 3.6 3.5 - 5.1 mmol/L   Chloride 111 98 - 111 mmol/L   CO2 20 (L) 22 - 32 mmol/L   Glucose, Bld 132 (H) 70 - 99 mg/dL    Comment: Glucose reference range applies only to samples taken after fasting for at least 8 hours.   BUN 36 (H) 6 - 20 mg/dL   Creatinine, Ser 1.09 0.61 - 1.24 mg/dL   Calcium 8.9 8.9 - 10.3 mg/dL   GFR, Estimated >60 >60 mL/min    Comment: (NOTE) Calculated using the CKD-EPI Creatinine Equation (2021)    Anion gap 10 5 - 15    Comment: Performed at [facility] [address] [place], [place] [address]  Magnesium     Status: Abnormal   Collection Time: 09/30/21  7:07 AM  Result Value Ref Range   Magnesium 2.6 (H) 1.7 - 2.4 mg/dL    Comment: Performed at [facility] [address], [place], [place] [address]  Glucose,  capillary     Status: Abnormal   Collection Time: 09/30/21  7:40 AM  Result Value Ref Range   Glucose-Capillary 144 (H) 70 - 99 mg/dL    Comment: Glucose reference range applies only to samples taken after fasting for at least 8 hours.  Glucose, capillary     Status: Abnormal   Collection Time: 09/30/21 11:19 AM  Result Value Ref Range   Glucose-Capillary 153 (H) 70 - 99 mg/dL    Comment: Glucose reference range applies only to samples taken after fasting for at least 8 hours.  Glucose, capillary     Status: Abnormal   Collection Time: 09/30/21  3:55 PM  Result Value Ref Range   Glucose-Capillary 161 (H) 70 - 99 mg/dL    Comment: Glucose reference range applies only to samples taken after fasting for at least 8 hours.  Urinalysis, Routine w reflex microscopic Urine, Catheterized     Status: Abnormal   Collection Time: 09/30/21  6:45 PM  Result Value Ref Range   Color, Urine YELLOW YELLOW   APPearance CLEAR CLEAR   Specific Gravity, Urine 1.025 1.005 - 1.030   pH 5.5 5.0 - 8.0   Glucose, UA NEGATIVE NEGATIVE mg/dL   Hgb urine dipstick TRACE (A) NEGATIVE   Bilirubin Urine NEGATIVE NEGATIVE   Ketones, ur NEGATIVE NEGATIVE mg/dL   Protein, ur 100 (A) NEGATIVE mg/dL   Nitrite NEGATIVE NEGATIVE   Leukocytes,Ua SMALL (A) NEGATIVE    Comment: Performed at [facility] [address], [place], [place] [address]  Urinalysis, Microscopic (reflex)     Status: Abnormal   Collection Time: 09/30/21  6:45 PM  Result Value Ref Range   RBC / HPF 6-10 0 - 5 RBC/hpf   WBC, UA 0-5 0 - 5 WBC/hpf   Bacteria, UA FEW (A) NONE SEEN   Squamous Epithelial / LPF 0-5 0 - 5   Mucus PRESENT    Hyaline Casts, UA PRESENT     Comment: Performed at [facility], [place] [address], [place], [place] [address]  Glucose, capillary     Status: Abnormal   Collection Time: 09/30/21  7:43 PM  Result Value Ref Range   Glucose-Capillary 117 (H) 70 - 99 mg/dL    Comment: Glucose reference range applies only  to samples taken after fasting for at least 8 hours.   Comment 1 Notify RN    Comment 2 Document in Chart   Glucose, capillary     Status: Abnormal   Collection Time: 09/30/21 11:32 PM  Result Value Ref Range   Glucose-Capillary 200 (H) 70 - 99 mg/dL    Comment: Glucose reference range applies only to samples taken after fasting for at least 8 hours.   Comment 1 Notify RN    Comment 2 Document in Chart   Glucose, capillary     Status: Abnormal   Collection Time: 10/01/21  3:56 AM  Result Value Ref Range   Glucose-Capillary 172 (H) 70 - 99 mg/dL    Comment: Glucose reference range applies only to samples taken after fasting for at least 8 hours.   Comment 1 Notify RN    Comment 2 Document in Chart   Basic metabolic panel     Status: Abnormal   Collection Time: 10/01/21  6:14 AM  Result Value Ref Range   Sodium 140 135 - 145 mmol/L   Potassium 3.5 3.5 - 5.1 mmol/L   Chloride 111 98 - 111 mmol/L   CO2 20 (L) 22 - 32 mmol/L   Glucose, Bld 158 (H) 70 - 99 mg/dL    Comment: Glucose reference range applies only to samples taken after fasting for at least 8 hours.   BUN 27 (H) 6 - 20 mg/dL   Creatinine, Ser 0.95 0.61 -  1.24 mg/dL   Calcium 8.9 8.9 - 10.3 mg/dL   GFR, Estimated >60 >60 mL/min    Comment: (NOTE) Calculated using the CKD-EPI Creatinine Equation (2021)    Anion gap 9 5 - 15    Comment: Performed at [facility] [address] [place], [place] [address]  CBC with Differential/Platelet     Status: Abnormal   Collection Time: 10/01/21  6:14 AM  Result Value Ref Range   WBC 14.2 (H) 4.0 - 10.5 K/uL   RBC 3.61 (L) 4.22 - 5.81 MIL/uL   Hemoglobin 11.1 (L) 13.0 - 17.0 g/dL   HCT 32.7 (L) 39.0 - 52.0 %   MCV 90.6 80.0 - 100.0 fL   MCH 30.7 26.0 - 34.0 pg   MCHC 33.9 30.0 - 36.0 g/dL   RDW 12.7 11.5 - 15.5 %   Platelets 257 150 - 400 K/uL   nRBC 0.0 0.0 - 0.2 %   Neutrophils Relative % 80 %   Neutro Abs 11.4 (H) 1.7 - 7.7 K/uL   Lymphocytes Relative 7 %   Lymphs  Abs 1.0 0.7 - 4.0 K/uL   Monocytes Relative 10 %   Monocytes Absolute 1.5 (H) 0.1 - 1.0 K/uL   Eosinophils Relative 2 %   Eosinophils Absolute 0.3 0.0 - 0.5 K/uL   Basophils Relative 0 %   Basophils Absolute 0.0 0.0 - 0.1 K/uL   Immature Granulocytes 1 %   Abs Immature Granulocytes 0.10 (H) 0.00 - 0.07 K/uL    Comment: Performed at [facility] [address], [place], [place] [address]  Glucose, capillary     Status: Abnormal   Collection Time: 10/01/21  8:05 AM  Result Value Ref Range   Glucose-Capillary 128 (H) 70 - 99 mg/dL    Comment: Glucose reference range applies only to samples taken after fasting for at least 8 hours.  Glucose, capillary     Status: Abnormal   Collection Time: 10/01/21 11:34 AM  Result Value Ref Range   Glucose-Capillary 138 (H) 70 - 99 mg/dL    Comment: Glucose reference range applies only to samples taken after fasting for at least 8 hours.   DG Chest Port 1 View  Result Date: 09/29/2021 CLINICAL DATA:  Tracheostomy present EXAM: PORTABLE CHEST 1 VIEW.  Patient is rotated COMPARISON:  Chest x-ray 09/27/2021 FINDINGS: Tracheostomy with tip terminating approximately 2 cm above the carina. Enteric tube coursing below the hemidiaphragm with tip collimated off view. The heart and mediastinal contours are unchanged. Slightly more prominent hilar vasculature. Interval development of linear airspace opacity overlying the right mid lung zone. Slight increased bilateral lower lung zone patchy airspace opacities. No pulmonary edema. No pleural effusion. No pneumothorax. No acute osseous abnormality. IMPRESSION: 1. Slight increased bilateral lower lung zone patchy airspace opacities. Finding may represent a combination of atelectasis versus infection/inflammation. Followup PA and lateral chest X-ray is recommended in 3-4 weeks following therapy to ensure resolution. 2. Pulmonary venous congestion. Electronically Signed   By: Iven Finn M.D.   On: 09/29/2021 15:18       Assessment/Plan Hemorrhagic CVA The patient's chart, vitals, labs, imaging, and notes have been reviewed.  We will likely plan to proceed with PEG tube placement tomorrow pending endo availability. Will plan to return to vent prior to the procedure and sedate at the bedside and then wean back to trach collar after sedation wears off.  I have called son, Xan Jasso, and described the procedure and discussed risks  and complications including but not limited to bleeding, infection, risk of injury to surrounding structures, inability to percutaneously, etc.  He understands.  Questions answered and verbal consent obtained with dual witness by myself and Merrilee Seashore, Therapist, sports.   FEN - hold TFs at MN VTE - heparin ID - Rocephin  Moderate Medical Decision Making  Respiratory failure A fib DM HTN  Henreitta Cea, [facility] 10/01/2021, 12:34 PM Please see Amion for pager number during day hours 7:00am-4:30pm or 7:00am -11:30am on weekends

## 2021-10-01 NOTE — Progress Notes (Addendum)
STROKE TEAM PROGRESS NOTE   INTERVAL HISTORY Patient is seen in his room with his mother and aunt at the bedside.  He is on trach collar and appears to be doing well.  His neurological exam is unchanged.  Overnight, he has been hemodynamically stable but febrile.  Chest x-ray and UA ordered. Vitals:   10/01/21 0900 10/01/21 1000 10/01/21 1010 10/01/21 1100  BP: (!) 148/60 138/61  (!) 164/79  Pulse: 84 81 82 84  Resp: (!) 33 (!) 26 (!) 28 (!) 36  Temp:      TempSrc:      SpO2: 97% 95% 98% 96%  Weight:      Height:       CBC:  Recent Labs  Lab 09/27/21 0936 09/27/21 1927 09/30/21 0707 10/01/21 0614  WBC 11.7*   < > 11.9* 14.2*  NEUTROABS 8.7*  --   --  11.4*  HGB 12.5*   < > 11.3* 11.1*  HCT 38.8*   < > 33.8* 32.7*  MCV 94.9   < > 91.6 90.6  PLT 228   < > 259 257   < > = values in this interval not displayed.    Basic Metabolic Panel:  Recent Labs  Lab 09/26/21 0429 09/26/21 1227 09/29/21 0358 09/30/21 0707 10/01/21 0614  NA 154*   < > 140 141 140  K 3.4*   < > 3.9 3.6 3.5  CL 125*   < > 111 111 111  CO2 22   < > 20* 20* 20*  GLUCOSE 199*   < > 122* 132* 158*  BUN 33*   < > 32* 36* 27*  CREATININE 1.09   < > 0.99 1.09 0.95  CALCIUM 9.2   < > 8.9 8.9 8.9  MG 2.7*  --  2.6* 2.6*  --   PHOS 3.4  --  3.3  --   --    < > = values in this interval not displayed.    Lipid Panel:  No results for input(s): CHOL, TRIG, HDL, CHOLHDL, VLDL, LDLCALC in the last 168 hours.  HgbA1c: No results for input(s): HGBA1C in the last 168 hours.  Urine Drug Screen:  No results for input(s): LABOPIA, COCAINSCRNUR, LABBENZ, AMPHETMU, THCU, LABBARB in the last 168 hours.   Alcohol Level No results for input(s): ETH in the last 168 hours.  IMAGING past 24 hours No results found.  PHYSICAL EXAM  Physical Exam  Constitutional: Appears well-developed and well-nourished.  Middle-aged Caucasian male not in distress.  Status post tracheostomy Cardiovascular: Normal rate and regular  rhythm.  Respiratory: Respirations even and unlabored on trach collar  Neuro: Eyes closed but easily arousable and follows, but does not open eyes spontaneously.  Will follow commands with RUE and RLE, no movement of LUE and LLE with hypotonia and dense left hemiplegia  ASSESSMENT/PLAN [name] is a 61 y.o. male with history of AFib on Coumadin s/p recent conversion, HTN, HLD, remote tobacco abuse, Cataract OU, glaucoma, obesity, CVA, DM II, and OSA who initially presented to the ED via EMS left sided weakness, dysarthria, and right fixed gaze. Coumadin reversed. Labetalol and cleviprex used for BP greater than 140 during CT. Patient was then intubated due to somnolence and inability to protect his airway. CT and MRI show stable right thalamic hemorrhage with intraventricular extension. ICH score 2. Febrile. Hypertonic saline discontinued 1/23. Most recent sodium is 155.  Currently in NSR with flecainide continued. Holding home antihypertensive medications currently. No longer  using cleviprex or norepinephrine (r/t hypotension after RSI) for BP control. Repeat CT shows no significant change in the hemorrhage or ventricle size. Weaning 1/28 and 1/29 at 30% , 5/5. Extubated 1/29 AM and reintubated 1/29 PM due to copious secretions.  Tracheostomy was placed 1/31.  Stroke:  Acute right thalamic ICH and IVH likely secondary to hypertension in the setting of coumadin use Code stroke CT - Positive for acute right thalamic hemorrhage with intraventricular extension. Estimated intra-axial blood volume 25 mL. Mild Regional brain edema. Trace leftward midline shift. 1/22 Repeat CT shows no changes from code stroke CT MRI  Stable right thalamic hemorrhage since presentation. Regional edema, tracking into the midbrain. 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 No increased intracranial mass effect. MRA  Abundant chronic micro-hemorrhages in the brain superimposed on chronic lacunar infarcts  compatible with advanced small vessel disease CT head 1/27 - unchanged hematoma and ventricular size Carotid Doppler  1-39% stenosis in Left and Right ICA 2D Echo EF 60-65% LDL 52 HgbA1c 9.7 VTE prophylaxis - heparin warfarin daily prior to admission, now on No antithrombotic. ICH Therapy recommendations:  CIR Disposition:  pending  Cerebral Edema with trace left midline shift Stable MRI and repeat CT scan Hypertonic saline discontinued Free water flushes given 200cc->400cc q4h NA- 150-> 154-> 156-> 155 -> 157 -> 164->158-> 154->148-> 143-> 141-> 140  Atrial Fibrillation Home meds: Flecainide, coreg, coumadin Flecainide continued, patient is in NSR currently  Fever ? Central fever Blood culture 1/2 staph epidermidis Repeat blood culture no growth Tmax 2/1 102.4 WBC 14.2 Chest x-ray and UA ordered  Hypertension Home meds:  Amlodipine, coreg, losartan-hydrochlorothiazide Restart gradually BP goal less than 160 BP labile post intubation, levophed d/c'd  Hyperlipidemia Home meds: Lipitor 80 LDL 52, goal < 70 Consider to resume Lipitor on discharge  Diabetes type II Uncontrolled Home meds:  Metformin, jardiance HgbA1c 9.7 , goal < 7.0 CBGs SSI Close PCP follow-up as outpatient for better DM control  Other Stroke Risk Factors Cigarette smoker, advised to stop smoking ETOH use, advised to drink no more than 2 drink(s) a day Obesity, Body mass index is 35.04 kg/m., BMI >/= 30 associated with increased stroke risk, recommend weight loss, diet and exercise as appropriate  Obstructive sleep apnea  Other Active Problems Acute hypoxemic respiratory failure CCM consult for intubation and ventilator management Sedation with propofol and fentanyl Monitor triglycerides Difficult intubation r/t tracheal stenosis Weaning 30% 5/5 Extubated 1/29 and reintubated Tracheostomy 1/31 Vent dyssynchrony  PRN sedation    Hospital day # 11   Patient seen and examined by NP/APP  with MD. MD to update note as needed.   [place] , MSN, AGACNP-BC [facility] See Amion for schedule and pager information 10/01/2021 11:46 AM  I have personally obtained history,examined this patient, reviewed notes, independently viewed imaging studies, participated in medical decision making and plan of care.ROS completed by me personally and pertinent positives fully documented  I have made any additions or clarifications directly to the above note. Agree with note above.  Continue weaning off ventilatory support on trach collar as tolerated as per CCM team.  Consult trauma team for PEG tube in the next few days.  Continue ongoing physical occupational therapy.  Continue strict blood pressure control with systolic goal below 0000000.  Okay to transfer to inpatient rehab hospital PEG tube in the next few days.  Long discussion with patient mother at the bedside and answered questions.  Discussed with Dr.  Agarwala and Dr. Grandville Silos.This patient is critically ill and at significant risk of neurological worsening, death and care requires constant monitoring of vital signs, hemodynamics,respiratory and cardiac monitoring, extensive review of multiple databases, frequent neurological assessment, discussion with family, other specialists and medical decision making of high complexity.I have made any additions or clarifications directly to the above note.This critical care time does not reflect procedure time, or teaching time or supervisory time of PA/NP/Med Resident etc but could involve care discussion time.  I spent 30 minutes of neurocritical care time  in the care of  this patient.      Antony Contras, MD Medical Director [facility] Pager: [phone] 10/01/2021 2:02 PM   To contact Stroke Continuity provider, please refer to [email or web address] After hours, contact General Neurology

## 2021-10-01 NOTE — Progress Notes (Signed)
Occupational Therapy Treatment Patient Details Name: Caleb Vaughan MRN: [id number] DOB: 08/18/1961 Today's Date: 10/01/2021   History of present illness Patient is a 61 y/o male admitted due to R thalamic hemorrhage, edema and trace leftward midline shift, intubated on admission, attempted extubation 1/29, re-intubated that evening.  Trach 1/31. PMH positive for a-fib on coumadin, HTN, HLD, previous smoker, prior CVA, DM, OSA.   OT comments  Pt seen on TC (8L/40% FiO2) with VSS; Able to progress to EOB and assist with grooming using toothette. L lateral push improved when R hand placed on thigh. Following commands during session. Eyes open occasionally. Per Dr Leonie Man, pt with eye lid apraxia. Continue to recommend intensive rehab at [facility] with expected assistance required at DC. Acute OT to follow.    Recommendations for follow up therapy are one component of a multi-disciplinary discharge planning process, led by the attending physician.  Recommendations may be updated based on patient status, additional functional criteria and insurance authorization.    Follow Up Recommendations  Acute inpatient rehab (3hours/day)    Assistance Recommended at Discharge Frequent or constant Supervision/Assistance  Patient can return home with the following      Equipment Recommendations  BSC/3in1;Wheelchair (measurements OT);Wheelchair cushion (measurements OT)    Recommendations for Other Services Rehab consult    Precautions / Restrictions Precautions Precautions: Fall Precaution Comments: trach, coretrack, rectal tube Restrictions Weight Bearing Restrictions: No       Mobility Bed Mobility Overal bed mobility: Needs Assistance Bed Mobility: Rolling, Sidelying to Sit, Sit to Sidelying Rolling: Max assist, +2 for physical assistance Sidelying to sit: Max assist, +2 for physical assistance   Sit to supine: Total assist, +2 for physical assistance   General bed mobility comments: Pt holding  bed rail, then unable to release bed rail; ? apraxic    Transfers                   General transfer comment: NA this date     Balance Overall balance assessment: Needs assistance   Sitting balance-Leahy Scale: Poor   Postural control: Posterior lean, Left lateral lean/push                                 ADL either performed or assessed with clinical judgement   ADL Overall ADL's : Needs assistance/impaired     Grooming: Maximal assistance Grooming Details (indicate cue type and reason): using toothette for oral care with assistance                             Functional mobility during ADLs: Maximal assistance;+2 for physical assistance      Extremity/Trunk Assessment Upper Extremity Assessment Upper Extremity Assessment: LUE deficits/detail LUE Deficits / Details: PROM WFL; no AROM observed; edematous L hand; elevated on 2 pillows LUE Coordination: decreased fine motor;decreased gross motor (non-functional)   Lower Extremity Assessment Lower Extremity Assessment: Defer to PT evaluation        Vision   Vision Assessment?: Vision impaired- to be further tested in functional context (eyes opened occasionally howver closed majority of session)   Perception Perception Perception: Impaired (pushes L at times)   Praxis Praxis Praxis:  (will assess) Praxis-Other Comments: ? eyelid apraxia    Cognition Arousal/Alertness: Lethargic, Suspect due to medications Behavior During Therapy: Flat affect Overall Cognitive Status: Difficult to assess  General Comments: following commands with occaisional eye opening; assisted with brushin gteeth with toothette and turning off/on suction        Exercises Exercises: General Upper Extremity General Exercises - Upper Extremity Shoulder Flexion: Left, 5 reps, PROM Shoulder ABduction: Left, 5 reps Elbow Flexion: Left, 5 reps, PROM, Supine Elbow  Extension: PROM, Left, 5 reps Wrist Flexion: PROM, Left, 5 reps Wrist Extension: PROM, Left, 5 reps Digit Composite Flexion: PROM, Left, 10 reps Composite Extension: PROM, Left, 10 reps    Shoulder Instructions       General Comments VSS on TC howeer pt appeared to have increased mucous in sitting; ableto cough with large amount of secretions out of trach; occusred agian once returned to supine; nsg in to deep suction pt    Pertinent Vitals/ Pain       Pain Assessment Pain Assessment: Faces Pain Score: 2  Pain Location: generalzied Pain Descriptors / Indicators: Grimacing Pain Intervention(s): Limited activity within patient's tolerance  Home Living                                          Prior Functioning/Environment              Frequency  Min 2X/week        Progress Toward Goals  OT Goals(current goals can now be found in the care plan section)  Progress towards OT goals: Progressing toward goals  Acute Rehab OT Goals Patient Stated Goal: per family to go to rehab OT Goal Formulation: Patient unable to participate in goal setting Time For Goal Achievement: 10/13/21 Potential to Achieve Goals: Good ADL Goals Pt Will Perform Grooming: with set-up;sitting;with supervision Pt Will Perform Upper Body Bathing: with min assist;sitting Pt Will Perform Lower Body Bathing: with mod assist;sit to/from stand Pt Will Transfer to Toilet: with mod assist;with +2 assist;bedside commode Additional ADL Goal #1: MAintain midline postural control with S to increase independence with ADL and mobility  Plan Discharge plan remains appropriate    Co-evaluation    PT/OT/SLP Co-Evaluation/Treatment: Yes Reason for Co-Treatment: Complexity of the patient's impairments (multi-system involvement);For patient/therapist safety   OT goals addressed during session: ADL's and self-care;Strengthening/ROM      AM-PAC OT "6 Clicks" Daily Activity     Outcome  Measure   Help from another person eating meals?: Total Help from another person taking care of personal grooming?: A Lot Help from another person toileting, which includes using toliet, bedpan, or urinal?: Total Help from another person bathing (including washing, rinsing, drying)?: Total Help from another person to put on and taking off regular upper body clothing?: Total Help from another person to put on and taking off regular lower body clothing?: Total 6 Click Score: 7    End of Session Equipment Utilized During Treatment: Oxygen (TC 40%FiO2; 8L)  OT Visit Diagnosis: Unsteadiness on feet (R26.81);Other abnormalities of gait and mobility (R26.89);Muscle weakness (generalized) (M62.81);Other symptoms and signs involving the nervous system (R29.898);Other symptoms and signs involving cognitive function;Hemiplegia and hemiparesis Hemiplegia - Right/Left: Left Hemiplegia - dominant/non-dominant: Non-Dominant Hemiplegia - caused by: Nontraumatic intracerebral hemorrhage   Activity Tolerance Patient tolerated treatment well   Patient Left in bed;with call bell/phone within reach;with bed alarm set;with restraints reapplied;with family/visitor present;with SCD's reapplied   Nurse Communication Mobility status;Other (comment) (respiratory status)        Time: [id number] OT Time Calculation (min): 32 min  Charges: OT General Charges $OT Visit: 1 Visit OT Treatments $Therapeutic Activity: 8-22 mins  Maurie Boettcher, OT/L   Acute OT Clinical Specialist [place] Pager [phone] Office [phone]   [facility] 10/01/2021, 10:48 AM

## 2021-10-01 NOTE — Evaluation (Signed)
[facility] Speaking Valve - Evaluation Patient Details  Name: Caleb Vaughan MRN: [id number] Date of Birth: 1961-08-11  Today's Date: 10/01/2021 Time: 1400-1415 SLP Time Calculation (min) (ACUTE ONLY): 15 min  Past Medical History: History reviewed. No pertinent past medical history. Past Surgical History: History reviewed. No pertinent surgical history. HPI:  Pt is a 61 y/o male who presented with left sided weakness, dysarthria, and right fixed gaze. Imaging revealed R thalamic hemorrhage, edema and trace leftward midline shift. Pt intubated in ED for increasing somnolence and concerns regarding airway protection. ETT 1/22-1/29 failed extubation due to secretions; reintubated 1/29-trach 1/31. PMH: A-fib on coumadin, HTN, HLD, previous smoker, prior CVA, DM, OSA.    Assessment / Plan / Recommendation  Clinical Impression  Pt was seen for PMSV evaluation. He was lethargic during the evaluation, but intermittently demonstrated physical movement in response to questions/activity. Pt was educated regarding the anatomy of the larynx, the impact of the trach on voicing, and the goals of the session; comprehension is questioned due to lethargy. Cuff was inflated upon SLP's arrival and pt tolerated cuff deflation well. Pt exhibited coughing upon deflation and tracheally expectorated tan-colored secretions. He presented with vitals RR 21, SpO2 94, and HR 77 at baseline and of RR 25, SpO2 98, and HR 79 upon cuff deflation. Pt tolerated intermittent PMSV placement without evidence of air trapping. He tolerated PMSV continuously for 2 minutes with vitals ranging RR 18-24, SpO2 98, and HR 81-82. Despite prompts and cues, no vocalization/verbalization attempts were made and PMSV was ultimately removed due to pt's increasing WOB. PMSV was removed at the end of session and cuff was re-inflated per Caleb Brass, RN's request. PMSV may be used with SLP only at this time and SLP will follow pt. SLP Visit Diagnosis: Aphonia  (R49.1)    SLP Assessment  Patient needs continued Speech Lanaguage Pathology Services    Recommendations for follow up therapy are one component of a multi-disciplinary discharge planning process, led by the attending physician.  Recommendations may be updated based on patient status, additional functional criteria and insurance authorization.  Follow Up Recommendations  Acute inpatient rehab (3hours/day)    Assistance Recommended at Discharge Frequent or constant Supervision/Assistance  Functional Status Assessment Patient has had a recent decline in their functional status and demonstrates the ability to make significant improvements in function in a reasonable and predictable amount of time.  Frequency and Duration min 2x/week  2 weeks    PMSV Trial PMSV was placed for: 2 Able to redirect subglottic air through upper airway: No Able to Attain Phonation: No Voice Quality:  (not observed) Able to Expectorate Secretions: Yes Level of Secretion Expectoration with PMSV: Tracheal Respirations During Trial:  (18-24) SpO2 During Trial:  (98) Pulse During Trial:  (81-82) Behavior: Lethargic;Poor eye contact;No attempt to communicate   Tracheostomy Tube       Vent Dependency  FiO2 (%): 40 %    Cuff Deflation Trial Tolerated Cuff Deflation: Yes Length of Time for Cuff Deflation Trial: 20 Behavior: Poor eye contact;Restless   Caleb Vaughan I. Caleb Clock, Vaughan, Caleb Vaughan Acute Rehabilitation Services Office number [phone] Pager [phone]        Caleb Vaughan 10/01/2021, 4:08 PM

## 2021-10-01 NOTE — Progress Notes (Signed)
NAME:  Caleb Vaughan, MRN:  [id number], DOB:  February 06, 1961, LOS: 22 ADMISSION DATE:  09/20/2021, CONSULTATION DATE:  09/20/2021 REFERRING MD:  Dr. Carren Rang, CHIEF COMPLAINT:  Hemorrhagic stroke    History of Present Illness:  Caleb Vaughan is a 61 y.o. male with PMH listed below  who presented to the emergency department 1/22 as a code stroke, last known normal 2 AM.   Stroke CT on admit revealed, positive for acute right thalamic hemorrhage with intraventricular extension with estimated intra-axial blood volume 25 mL. Mild Regional brain edema. Trace leftward midline shift. Patient was intubated in ED and PCCM was consulted for further assessment.   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 Intubated in ED for increasing somnolence and inability to protect airway.  1/24 increased respiratory distress requiring increased sedation and intermittent neuromuscular blockade.  Improved with diuresis.  3% saline stopped for increasing sodium above 159 1/29 Continue to tolerated wean but gets agitated with decreased sedation, failed extubation 2/2 secretions, foley placed for urinary retention, tmax 102 1/30 PICC removed, increased bethanechol, weaned x 12 hrs  1/31 trach  2/1 trach collar for a few hours, back on vent after PT. Started rocephin for fever + WBC 14 (?UTI)   Interim History / Subjective:  NAEO  On vent overnight Temp 99.5 -- 2.6g APAP   Objective   Blood pressure (!) 148/60, pulse 84, temperature 99.8 F (37.7 C), temperature source Axillary, resp. rate (!) 33, height 5\' 4"  (1.626 m), weight 92.6 kg, SpO2 97 %.    Vent Mode: PRVC FiO2 (%):  [40 %] 40 % Set Rate:  [16 bmp]  16 bmp Vt Set:  [470 mL] 470 mL PEEP:  [5 cmH20] 5 cmH20 Plateau Pressure:  [9 cmH20-12 cmH20] 9 cmH20   Intake/Output Summary (Last 24 hours) at 10/01/2021 1004 Last data filed at 10/01/2021 0900 Gross per 24 hour  Intake 2060 ml  Output 2290 ml  Net -230 ml   Filed Weights   09/25/21 0500 09/27/21 0500 09/30/21 0126  Weight: 95 kg 94.7 kg 92.6 kg   Examination:  General:  acutely ill middle aged M reclined in bed NAD  HEENT: NCAT pink mm Trach secure anicteric sclera, Cortrak. Neuro: Does not open eyes. PERRL 63mm. Following RUE RLE commands  CV: rr s1s2 no rgm cap refill brisk  PULM:  symmetrical chest expansion, even unlabored on ATC  GI: soft ndnt + bowel sounds  Extremities: no acute joint deformity. Non-pitting edema  Skin: c/d/w no rash    Ancillary tests   1/23 MRI >  Stable right thalamic hemorrhage since presentation. Regional edema, tracking into the midbrain. 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 No increased intracranial mass effect. 1/23 MRA>  Abundant chronic micro-hemorrhages in the brain superimposed on chronic lacunar infarcts compatible with advanced small vessel disease 1/23 TTE> LVEF 60-65, no wall abnormalities, normal RV 1/27 CT head: No progression r thalamocapsular hematoma with intraventricular extension. No hydrocephalus, or mass effect    Assessment & Plan:   Nontraumatic R thalamic hemorrhage Cerebral edema  -etiology suspected 2/2 HTN, coumadin  P: - stroke service primary  - SBP goal < 160  -wean sedating meds  as able -- will dc PRN fent   Acute respiratory failure with hypoxia requiring MV in setting of thalamic hemorrhage  S/p tracheostomy (1/31) Suspected aspiration event (1/29) Hx OSA, Hx tracheal stenosis  -failed extubation 1/29 due to poor secretion management, possible aspirated at this time -s/p trach 1/31 -2/1 tolerated ATC for a few hours, fatigued after working with PT and put back on vent  P: - Cont ATC  trials  -Pulm hygiene -routine trach care   -PRN BZD + TID oxy   pAfib, now sinus  HTN Hx HLD  P: - cont flecanide -cardiac monitoring -Mag >2 K > 4 -norvasc, coreg, hydral   Iatrogenic hypernatremia, improved  Hypokalemia  -HyperNa due to 3% saline.  P: - cont. free water   - giving 42mEq enteral K  -follow qD BMP   DM II P: -Levemir 27unit BID -rSSI -q4hr TF coverage -- 5units lovenog   Urinary retention - continue foley, reinserted 1/29.  This is his third foley due to retention.  P -cont bethanechol -wean sedating meds as able  - may need uro this admit   Fever + Leukocytosis  -infectious (suspected aspiration 1/29, repeated foleys this admit for retention) vs central (thalamic stroke) -looks like he has had intermittent fever, leukocytosis this admission which might argue more toward central  P: -cont rocephin -trend WBC, fever curve -cooling blanket, APAP PRN   Best Practice (right click and "Reselect all SmartList Selections" daily)   Diet/type: tubefeeds, w/ fiber  DVT prophylaxis: SCD and heparin [place]  GI prophylaxis: PPI Lines: N/A Foley:  Yes, and it is still needed Code Status:  full code Last date of multidisciplinary goals of care discussion:  ongoing   CRITICAL CARE Performed by: Cristal Generous  Total critical care time: 38 minutes  Critical care time was exclusive of separately billable procedures and treating other patients. Critical care was necessary to treat or prevent imminent or life-threatening deterioration.  Critical care was time spent personally by me on the following activities: development of treatment plan with patient and/or surrogate as well as nursing, discussions with consultants, evaluation of patient's response to treatment, examination of patient, obtaining history from patient or surrogate, ordering and performing treatments and interventions, ordering and review of laboratory studies, ordering and review of  radiographic studies, pulse oximetry and re-evaluation of patient's condition.  Eliseo Gum MSN, AGACNP-BC [place] for pager 10/01/2021, 10:04 AM

## 2021-10-01 NOTE — Progress Notes (Signed)
PEG planned for 10am tomorrow at bedside.  Will return to vent and sedate with bedside nursing.  Letha Cape 3:43 PM 10/01/2021

## 2021-10-01 NOTE — Progress Notes (Signed)
Physical Therapy Treatment Patient Details Name: Caleb Vaughan MRN: [id number] DOB: Jul 01, 1961 Today's Date: 10/01/2021   History of Present Illness Patient is a 61 y/o male admitted due to R thalamic hemorrhage, edema and trace leftward midline shift, intubated on admission, attempted extubation 1/29, re-intubated that evening.  Trach 1/31. PMH positive for a-fib on coumadin, HTN, HLD, previous smoker, prior CVA, DM, OSA.    PT Comments    Patient progressing as able to sit and participate with OT with functional ADL briefly and maintain vitals while on trach collar.  Patient participating using suction and giving thumbs up to family who arrived during session.  Stroke MD reports pt with eyelid apraxia likely keeping him from maintaining eye opening during session.  Able to open breifly, but not sustain.  Pt will continue to benefit from skilled PT in the acute setting and from follow up acute inpatient rehab at d/c.     Recommendations for follow up therapy are one component of a multi-disciplinary discharge planning process, led by the attending physician.  Recommendations may be updated based on patient status, additional functional criteria and insurance authorization.  Follow Up Recommendations  Acute inpatient rehab (3hours/day)     Assistance Recommended at Discharge Frequent or constant Supervision/Assistance  Patient can return home with the following Two people to help with walking and/or transfers;Two people to help with bathing/dressing/bathroom   Equipment Recommendations  Other (comment) (TBA)    Recommendations for Other Services       Precautions / Restrictions Precautions Precautions: Fall Precaution Comments: trach, coretrack, rectal tube Restrictions Weight Bearing Restrictions: No     Mobility  Bed Mobility Overal bed mobility: Needs Assistance Bed Mobility: Rolling, Sidelying to Sit, Sit to Sidelying Rolling: Max assist, +2 for physical  assistance Sidelying to sit: Max assist, +2 for physical assistance   Sit to supine: Total assist, +2 for physical assistance   General bed mobility comments: Pt holding bed rail, then unable to release bed rail    Transfers                   General transfer comment: NT    Ambulation/Gait                   Stairs             Wheelchair Mobility    Modified Rankin (Stroke Patients Only) Modified Rankin (Stroke Patients Only) Pre-Morbid Rankin Score: No symptoms Modified Rankin: Severe disability     Balance Overall balance assessment: Needs assistance Sitting-balance support: Feet supported, Single extremity supported Sitting balance-Leahy Scale: Poor Sitting balance - Comments: mod to max A for balance increased with fatigue, able to sit and assist with oral hygiene with OT with mod A. Postural control: Left lateral lean, Posterior lean                                  Cognition Arousal/Alertness: Lethargic Behavior During Therapy: Flat affect Overall Cognitive Status: Difficult to assess                                 General Comments: following commands with occaisional eye opening; assisted with brushing teeth with toothette and turning off/on suction        Exercises      General Comments General comments (skin integrity, edema, etc.): VSS on trach  collar, increased mucous production in sitting, coughed with secretions out of trach, RN in to deep suction after return to supine      Pertinent Vitals/Pain Pain Assessment Pain Assessment: Faces Faces Pain Scale: Hurts a little bit Pain Location: generalzied Pain Descriptors / Indicators: Aching Pain Intervention(s): Monitored during session, Repositioned, Limited activity within patient's tolerance    Home Living                          Prior Function            PT Goals (current goals can now be found in the care plan section) Progress  towards PT goals: Progressing toward goals    Frequency    Min 4X/week      PT Plan Current plan remains appropriate    Co-evaluation PT/OT/SLP Co-Evaluation/Treatment: Yes Reason for Co-Treatment: For patient/therapist safety;To address functional/ADL transfers PT goals addressed during session: Mobility/safety with mobility;Balance OT goals addressed during session: ADL's and self-care;Strengthening/ROM      AM-PAC PT "6 Clicks" Mobility   Outcome Measure  Help needed turning from your back to your side while in a flat bed without using bedrails?: Total Help needed moving from lying on your back to sitting on the side of a flat bed without using bedrails?: Total Help needed moving to and from a bed to a chair (including a wheelchair)?: Total Help needed standing up from a chair using your arms (e.g., wheelchair or bedside chair)?: Total Help needed to walk in hospital room?: Total Help needed climbing 3-5 steps with a railing? : Total 6 Click Score: 6    End of Session Equipment Utilized During Treatment: Oxygen Activity Tolerance: Patient limited by fatigue;Patient limited by lethargy Patient left: in bed;with call bell/phone within reach;with bed alarm set;with SCD's reapplied;with family/visitor present   PT Visit Diagnosis: Hemiplegia and hemiparesis;Other abnormalities of gait and mobility (R26.89);Muscle weakness (generalized) (M62.81) Hemiplegia - Right/Left: Left Hemiplegia - dominant/non-dominant: Non-dominant Hemiplegia - caused by: Nontraumatic intracerebral hemorrhage     Time: 1000-1024 PT Time Calculation (min) (ACUTE ONLY): 24 min  Charges:  $Therapeutic Activity: 8-22 mins                     Caleb Vaughan, PT Acute Rehabilitation Services [phone] Office:[phone] 10/01/2021    Caleb Vaughan 10/01/2021, 11:52 AM

## 2021-10-01 NOTE — Procedures (Signed)
Percutaneous Tracheostomy Procedure Note   Caleb Vaughan  [id number]  07-Aug-1961  Date:09/29/21  Time: 2:41 PM   Provider Performing:Khari Mally  Procedure: Percutaneous Tracheostomy with Bronchoscopic Guidance (27782)  Indication(s) Prolonged mechanical ventilation.   Consent Risks of the procedure as well as the alternatives and risks of each were explained to the patient and/or caregiver.  Consent for the procedure was obtained.  Anesthesia Etomidate, Versed, Fentanyl, Vecuronium   Time Out Verified patient identification, verified procedure, site/side was marked, verified correct patient position, special equipment/implants available, medications/allergies/relevant history reviewed, required imaging and test results available.   Sterile Technique Maximal sterile technique including sterile barrier drape, hand hygiene, sterile gown, sterile gloves, mask, hair covering.    Procedure Description Appropriate anatomy identified by palpation.  Patient's neck prepped and draped in sterile fashion.  1% lidocaine with epinephrine was used to anesthetize skin overlying neck.  1.5cm incision made and blunt dissection performed until tracheal rings could be easily palpated.   Then a size 6 proximal XLT Shiley tracheostomy was placed under bronchoscopic visualization using usual Seldinger technique and serial dilation.   Bronchoscope confirmed placement above the carina.  Tracheostomy was sutured in place with adhesive pad to protect skin under pressure.    Patient connected to ventilator.   Complications/Tolerance None; patient tolerated the procedure well. Chest X-ray is ordered to confirm no post-procedural complication.   EBL Minimal   Specimen(s) None    Lynnell Catalan, MD [facility] ICU Physician [facility] [place] [facility]  Pager: [phone] Or Epic Secure Chat After hours: [phone].  10/01/2021, 3:43 PM

## 2021-10-02 ENCOUNTER — Encounter (HOSPITAL_COMMUNITY)
Admission: EM | Disposition: A | Discharge status: Nursing Home (Includes ICF) | Payer: Self-pay | Source: Home / Self Care | Attending: Neurology

## 2021-10-02 ENCOUNTER — Inpatient Hospital Stay (HOSPITAL_COMMUNITY): Payer: No Typology Code available for payment source | Admitting: Anesthesiology

## 2021-10-02 ENCOUNTER — Encounter (HOSPITAL_COMMUNITY): Payer: Self-pay | Admitting: Neurology

## 2021-10-02 ENCOUNTER — Other Ambulatory Visit: Payer: Self-pay

## 2021-10-02 DIAGNOSIS — I619 Nontraumatic intracerebral hemorrhage, unspecified: Secondary | ICD-10-CM | POA: Diagnosis not present

## 2021-10-02 HISTORY — PX: ESOPHAGOGASTRODUODENOSCOPY: SHX5428

## 2021-10-02 HISTORY — PX: PEG PLACEMENT: SHX5437

## 2021-10-02 LAB — CBC
HCT: 33.2 % — ABNORMAL LOW (ref 39.0–52.0)
Hemoglobin: 11.4 g/dL — ABNORMAL LOW (ref 13.0–17.0)
MCH: 31.2 pg (ref 26.0–34.0)
MCHC: 34.3 g/dL (ref 30.0–36.0)
MCV: 91 fL (ref 80.0–100.0)
Platelets: 293 10*3/uL (ref 150–400)
RBC: 3.65 MIL/uL — ABNORMAL LOW (ref 4.22–5.81)
RDW: 13 % (ref 11.5–15.5)
WBC: 15.3 10*3/uL — ABNORMAL HIGH (ref 4.0–10.5)
nRBC: 0 % (ref 0.0–0.2)

## 2021-10-02 LAB — BASIC METABOLIC PANEL
Anion gap: 9 (ref 5–15)
BUN: 22 mg/dL — ABNORMAL HIGH (ref 6–20)
CO2: 20 mmol/L — ABNORMAL LOW (ref 22–32)
Calcium: 9 mg/dL (ref 8.9–10.3)
Chloride: 114 mmol/L — ABNORMAL HIGH (ref 98–111)
Creatinine, Ser: 0.87 mg/dL (ref 0.61–1.24)
GFR, Estimated: >60 mL/min (ref >60)
Glucose, Bld: 78 mg/dL (ref 70–99)
Potassium: 4 mmol/L (ref 3.5–5.1)
Sodium: 143 mmol/L (ref 135–145)

## 2021-10-02 LAB — GLUCOSE, CAPILLARY
Glucose-Capillary: 78 mg/dL (ref 70–99)
Glucose-Capillary: 96 mg/dL (ref 70–99)
Glucose-Capillary: 133 mg/dL — ABNORMAL HIGH (ref 70–99)
Glucose-Capillary: 192 mg/dL — ABNORMAL HIGH (ref 70–99)
Glucose-Capillary: 316 mg/dL — ABNORMAL HIGH (ref 70–99)
Glucose-Capillary: 344 mg/dL — ABNORMAL HIGH (ref 70–99)

## 2021-10-02 LAB — URINE CULTURE: Culture: NO GROWTH

## 2021-10-02 SURGERY — EGD (ESOPHAGOGASTRODUODENOSCOPY)
Anesthesia: General

## 2021-10-02 MED ORDER — JEVITY 1.5 CAL/FIBER PO LIQD
1000.0000 mL | ORAL | Status: DC
  Administered 2021-10-02 – 2021-10-15 (×11): 1000 mL
  Filled 2021-10-02 (×20): qty 1000

## 2021-10-02 MED ORDER — FENTANYL CITRATE (PF) 100 MCG/2ML IJ SOLN
INTRAMUSCULAR | Status: DC | PRN
Start: 2021-10-02 — End: 2021-10-02
  Administered 2021-10-02 (×2): 25 ug via INTRAVENOUS
  Administered 2021-10-02: 50 ug via INTRAVENOUS

## 2021-10-02 MED ORDER — PHENYLEPHRINE 40 MCG/ML (10ML) SYRINGE FOR IV PUSH (FOR BLOOD PRESSURE SUPPORT)
PREFILLED_SYRINGE | INTRAVENOUS | Status: DC | PRN
  Administered 2021-10-02: 80 ug via INTRAVENOUS

## 2021-10-02 MED ORDER — PROSOURCE TF PO LIQD
90.0000 mL | Freq: Two times a day (BID) | ORAL | Status: DC
  Administered 2021-10-02 – 2021-10-16 (×27): 90 mL
  Filled 2021-10-02 (×28): qty 90

## 2021-10-02 MED ORDER — LIDOCAINE 2% (20 MG/ML) 5 ML SYRINGE
INTRAMUSCULAR | Status: DC | PRN
  Administered 2021-10-02: 40 mg via INTRAVENOUS

## 2021-10-02 MED ORDER — LACTATED RINGERS IV SOLN
INTRAVENOUS | Status: DC | PRN
Start: 2021-10-02 — End: 2021-10-02

## 2021-10-02 MED ORDER — DEXAMETHASONE SODIUM PHOSPHATE 10 MG/ML IJ SOLN
INTRAMUSCULAR | Status: DC | PRN
Start: 2021-10-02 — End: 2021-10-02
  Administered 2021-10-02: 10 mg via INTRAVENOUS

## 2021-10-02 MED ORDER — ONDANSETRON HCL 4 MG/2ML IJ SOLN
INTRAMUSCULAR | Status: DC | PRN
  Administered 2021-10-02: 4 mg via INTRAVENOUS

## 2021-10-02 NOTE — Procedures (Signed)
° °  Procedure Note  Date: 10/02/2021  Procedure: esophagogastroduodenoscopy (EGD) and percutaneous endoscopic gastrostomy (PEG) tube placement  Pre-op diagnosis: dysphagia, malnutrition Post-op diagnosis: same  Indication and clinical history: 53M with dysphagia after stroke  Surgeon: Jesusita Oka, MD Assistant: Maxwell Caul, [place]  Anesthesia: MAC  Findings: small gastric polyps Specimen: none EBL: <5cc Drains/Implants: PEG tube, 3cm at the skin   Disposition: ICU/PACU  Description of Procedure: The patient was positioned semi-recumbent. Time-out was performed verifying correct patient, procedure, signature of informed consent, and pre-operative antibiotics as indicated. MAC induction was uneventful and a bite block was placed into the oropharynx. The endoscope was inserted into the oropharynx and advanced down the esophagus into the stomach and into the duodenum. The visualized esophagus and duodenum were unremarkable. The endoscope was retracted back into the stomach and the stomach was insufflated. The stomach was inspected and was also normal. Transillumination was performed. The light was visible on the external skin and dimpling of the stomach was noted endoscopically with manual pressure. The abdomen was prepped and draped in the usual sterile fashion. Transillumination and dimpling were repeated and local anesthetic was infiltrated to make a skin wheal at the site of transillumination. The needle was inserted perpendicularly to the skin and the tip of the needle was visualized endoscopically. As the needle was retracted, the tract was also anesthetized. A skin nick was made at the site of the wheal and an introducer needle and sheath were inserted. The needle was removed and guidewire inserted. The guidewire was grasped by an endoscopic snare and the snare, guidewire, and endoscope retracted out of the oropharynx. The PEG tube was secured to the guidewire and retracted through the mouth and  esophagus into the stomach. The PEG tube was secured with a bolster and was visualized endoscopically to spin freely circumferentially and also be without gaps between the internal bumper and the stomach wall. There was no evidence of bleeding. The PEG bolster was secured at 3cm at the skin and there were no gaps between the bolster and the abdominal wall. The stomach was desufflated endoscopically and the endoscope removed. The bite block was also removed. The patient tolerated the procedure well and there were no complications.   The patient may have water and medications administered via the PEG tube beginning immediately and tube feeds may be initiated four hours post-procedure.    Jesusita Oka, MD General and [place] [facility]

## 2021-10-02 NOTE — Progress Notes (Addendum)
STROKE TEAM PROGRESS NOTE   INTERVAL HISTORY Patient is seen in his room.  He is on trach collar and appears to be doing well.  His neurological exam is unchanged.  WBC 15.3, Tmax 99.53F. PEG tube is scheduled for later this afternoon.  No family at the bedside.  Vital signs stable.  Blood pressure adequately controlled Vitals:   10/02/21 0500 10/02/21 0600 10/02/21 0726 10/02/21 0800  BP: (!) 143/64 (!) 132/57    Pulse: 85 80 87   Resp: 18 (!) 27 (!) 24   Temp:    99.6 F (37.6 C)  TempSrc:    Oral  SpO2: 99% 98% 96%   Weight:      Height:       CBC:  Recent Labs  Lab 09/27/21 0936 09/27/21 1927 10/01/21 0614 10/02/21 0409  WBC 11.7*   < > 14.2* 15.3*  NEUTROABS 8.7*  --  11.4*  --   HGB 12.5*   < > 11.1* 11.4*  HCT 38.8*   < > 32.7* 33.2*  MCV 94.9   < > 90.6 91.0  PLT 228   < > 257 293   < > = values in this interval not displayed.    Basic Metabolic Panel:  Recent Labs  Lab 09/26/21 0429 09/26/21 1227 09/29/21 0358 09/30/21 0707 10/01/21 0614 10/02/21 0409  NA 154*   < > 140 141 140 143  K 3.4*   < > 3.9 3.6 3.5 4.0  CL 125*   < > 111 111 111 114*  CO2 22   < > 20* 20* 20* 20*  GLUCOSE 199*   < > 122* 132* 158* 78  BUN 33*   < > 32* 36* 27* 22*  CREATININE 1.09   < > 0.99 1.09 0.95 0.87  CALCIUM 9.2   < > 8.9 8.9 8.9 9.0  MG 2.7*  --  2.6* 2.6*  --   --   PHOS 3.4  --  3.3  --   --   --    < > = values in this interval not displayed.    Lipid Panel:  No results for input(s): CHOL, TRIG, HDL, CHOLHDL, VLDL, LDLCALC in the last 168 hours.  HgbA1c: No results for input(s): HGBA1C in the last 168 hours.  Urine Drug Screen:  No results for input(s): LABOPIA, COCAINSCRNUR, LABBENZ, AMPHETMU, THCU, LABBARB in the last 168 hours.   Alcohol Level No results for input(s): ETH in the last 168 hours.  IMAGING past 24 hours DG CHEST PORT 1 VIEW  Result Date: 10/01/2021 CLINICAL DATA:  Fever.  Stroke. EXAM: PORTABLE CHEST 1 VIEW COMPARISON:  09/29/2021  FINDINGS: Tracheostomy tube noted, the tracheal air shadow around this tube is indistinct but the tube projects in a reasonable location compared to the expected location of the trachea. A feeding tube extends down into the stomach and beyond the inferior margin of imaging. Borderline enlargement of the cardiopericardial silhouette. Mild interstitial accentuation at the lung bases with some indistinctness of the pulmonary vasculature. I do not see well-defined Kerley B lines. Increased linear bandlike opacity above the right hemidiaphragm. Mildly reduced bandlike opacity in the right mid lung. Mildly reduced bandlike opacities in the retrocardiac region. IMPRESSION: 1. Improved aeration in the right mid lung and left lung base although there is some increase indistinct bandlike density above the right hemidiaphragm probably from atelectasis, less likely to be from developing pneumonia. 2. Borderline enlargement of the cardiopericardial silhouette. Mild indistinctness of the pulmonary  vasculature with mild interstitial accentuation in the lung bases, pulmonary venous hypertension with borderline interstitial edema is a possibility. Electronically Signed   By: Van Clines M.D.   On: 10/01/2021 13:35    PHYSICAL EXAM  Physical Exam  Constitutional: Appears well-developed and well-nourished.  Middle-aged Caucasian male not in distress.  Status post tracheostomy Cardiovascular: Normal rate and regular rhythm.  Respiratory: Respirations even and unlabored on trach collar  Neuro: Eyes closed but easily arousable and follows, but does not open eyes spontaneously.  Will follow commands with RUE and RLE, no movement of LUE and LLE with hypotonia and dense left hemiplegia  ASSESSMENT/PLAN Mr. Johnathan Mound is a 61 y.o. male with history of AFib on Coumadin s/p recent conversion, HTN, HLD, remote tobacco abuse, Cataract OU, glaucoma, obesity, CVA, DM II, and OSA who initially presented to the ED via EMS  left sided weakness, dysarthria, and right fixed gaze. Coumadin reversed. Labetalol and cleviprex used for BP greater than 140 during CT. Patient was then intubated due to somnolence and inability to protect his airway. CT and MRI show stable right thalamic hemorrhage with intraventricular extension. ICH score 2. Febrile. Hypertonic saline discontinued 1/23. Most recent sodium is 155.  Currently in NSR with flecainide continued. Holding home antihypertensive medications currently. No longer using cleviprex or norepinephrine (r/t hypotension after RSI) for BP control. Repeat CT shows no significant change in the hemorrhage or ventricle size. Weaning 1/28 and 1/29 at 30% , 5/5. Extubated 1/29 AM and reintubated 1/29 PM due to copious secretions.  Tracheostomy was placed 1/31. Plan for PEG tube 2/3 at 1400.  Stroke:  Acute right thalamic ICH and IVH likely secondary to hypertension in the setting of coumadin use Code stroke CT - Positive for acute right thalamic hemorrhage with intraventricular extension. Estimated intra-axial blood volume 25 mL. Mild Regional brain edema. Trace leftward midline shift. 1/22 Repeat CT shows no changes from code stroke CT MRI  Stable right thalamic hemorrhage since presentation. Regional edema, tracking into the midbrain. 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 No increased intracranial mass effect. MRA  Abundant chronic micro-hemorrhages in the brain superimposed on chronic lacunar infarcts compatible with advanced small vessel disease CT head 1/27 - unchanged hematoma and ventricular size Carotid Doppler  1-39% stenosis in Left and Right ICA 2D Echo EF 60-65% LDL 52 HgbA1c 9.7 VTE prophylaxis - heparin warfarin daily prior to admission, now on No antithrombotic. ICH Therapy recommendations:  CIR Disposition:  pending  Cerebral Edema with trace left midline shift Stable MRI and repeat CT scan Hypertonic saline discontinued, d/c Na serial checks Free  water flushes given 200cc->400cc q4h NA- 150-> 154-> 156-> 155 -> 157 -> 164->158-> 154->148-> 143-> 141-> 140  Atrial Fibrillation Home meds: Flecainide, coreg, coumadin Flecainide continued, patient is in NSR currently  Fever ? Central fever Blood culture 1/2 staph epidermidis Repeat blood culture no growth Tmax 2/1 102.4 WBC 14.2 Chest x-ray and UA ordered  Hypertension Home meds:  Amlodipine, coreg, losartan-hydrochlorothiazide Restart gradually BP goal less than 160 BP labile post intubation, levophed d/c'd  Hyperlipidemia Home meds: Lipitor 80 LDL 52, goal < 70 Consider to resume Lipitor on discharge  Diabetes type II Uncontrolled Home meds:  Metformin, jardiance HgbA1c 9.7 , goal < 7.0 CBGs SSI Close PCP follow-up as outpatient for better DM control  Other Stroke Risk Factors Cigarette smoker, advised to stop smoking ETOH use, advised to drink no more than 2 drink(s) a day Obesity, Body mass index is  35.04 kg/m., BMI >/= 30 associated with increased stroke risk, recommend weight loss, diet and exercise as appropriate  Obstructive sleep apnea  Other Active Problems Acute hypoxemic respiratory failure CCM consult for intubation and ventilator management Sedation with propofol and fentanyl Monitor triglycerides Difficult intubation r/t tracheal stenosis Extubated 1/29 and reintubated Tracheostomy 1/31    Hospital day # 12  Patient seen and examined by NP/APP with MD. MD to update note as needed.   Janine Ores, DNP, FNP-BC [facility] Pager: [phone]  I have personally obtained history,examined this patient, reviewed notes, independently viewed imaging studies, participated in medical decision making and plan of care.ROS completed by me personally and pertinent positives fully documented  I have made any additions or clarifications directly to the above note. Agree with note above.  Continue trach collar trials and wean off ventilator  support.  PEG tube hopefully later today by trauma team.  Continue ongoing therapies and hopefully transfer to rehab early next week.  Patient may also consider possible participation in the [facility]  study if patient and family are willing.  Discussed with Dr. Silas Flood and Dr. Bobbye Morton.This patient is critically ill and at significant risk of neurological worsening, death and care requires constant monitoring of vital signs, hemodynamics,respiratory and cardiac monitoring, extensive review of multiple databases, frequent neurological assessment, discussion with family, other specialists and medical decision making of high complexity.I have made any additions or clarifications directly to the above note.This critical care time does not reflect procedure time, or teaching time or supervisory time of PA/NP/Med Resident etc but could involve care discussion time.  I spent 30 minutes of neurocritical care time  in the care of  this patient.      Antony Contras, MD Medical Director [place] Pager: [phone] 10/02/2021 3:17 PM   To contact Stroke Continuity provider, please refer to [email or web address] After hours, contact General Neurology

## 2021-10-02 NOTE — Anesthesia Postprocedure Evaluation (Signed)
Anesthesia Post Note  Patient: Caleb Vaughan  Procedure(s) Performed: ESOPHAGOGASTRODUODENOSCOPY (EGD) PERCUTANEOUS ENDOSCOPIC GASTROSTOMY (PEG) PLACEMENT     Patient location during evaluation: Endoscopy Anesthesia Type: General Level of consciousness: lethargic and patient remains intubated per anesthesia plan Pain management: pain level controlled Vital Signs Assessment: post-procedure vital signs reviewed and stable Respiratory status: spontaneous breathing and patient connected to tracheostomy mask oxygen Cardiovascular status: blood pressure returned to baseline and stable Postop Assessment: no apparent nausea or vomiting Anesthetic complications: no   No notable events documented.  Last Vitals:  Vitals:   10/02/21 1623 10/02/21 1633  BP: (!) 135/52 (!) 133/51  Pulse: 87 86  Resp: (!) 24 (!) 24  Temp:    SpO2: 93% 93%    Last Pain:  Vitals:   10/02/21 1553  TempSrc: Temporal  PainSc:                  Collene Schlichter

## 2021-10-02 NOTE — Anesthesia Preprocedure Evaluation (Addendum)
Anesthesia Evaluation  Patient identified by MRN, date of birth, ID band Patient awake    Reviewed: Allergy & Precautions, H&P , NPO status , Patient's Chart, lab work & pertinent test results  Airway Mallampati: Trach   Neck ROM: full    Dental   Pulmonary sleep apnea ,  tracheostomy   breath sounds clear to auscultation       Cardiovascular hypertension, + dysrhythmias Atrial Fibrillation  Rhythm:regular Rate:Normal     Neuro/Psych Intracranial bleeding with left sided weakness negative psych ROS   GI/Hepatic   Endo/Other  diabetes, Type 2  Renal/GU      Musculoskeletal   Abdominal   Peds  Hematology   Anesthesia Other Findings   Reproductive/Obstetrics                            Anesthesia Physical Anesthesia Plan  ASA: 3  Anesthesia Plan: General   Post-op Pain Management:    Induction: Intravenous  PONV Risk Score and Plan: 2 and Ondansetron, Dexamethasone and Treatment may vary due to age or medical condition  Airway Management Planned: Tracheostomy  Additional Equipment:   Intra-op Plan:   Post-operative Plan: Possible Post-op intubation/ventilation  Informed Consent: I have reviewed the patients History and Physical, chart, labs and discussed the procedure including the risks, benefits and alternatives for the proposed anesthesia with the patient or authorized representative who has indicated his/her understanding and acceptance.     Dental advisory given and Consent reviewed with POA  Plan Discussed with: CRNA, Anesthesiologist and Surgeon  Anesthesia Plan Comments: (POA [place] phone consent.)       Anesthesia Quick Evaluation

## 2021-10-02 NOTE — Transfer of Care (Signed)
Immediate Anesthesia Transfer of Care Note  Patient: Caleb Vaughan  Procedure(s) Performed: ESOPHAGOGASTRODUODENOSCOPY (EGD) PERCUTANEOUS ENDOSCOPIC GASTROSTOMY (PEG) PLACEMENT  Patient Location: Endoscopy Unit  Anesthesia Type:General  Level of Consciousness: drowsy  Airway & Oxygen Therapy: Patient Spontanous Breathing and Patient connected to tracheostomy mask oxygen  Post-op Assessment: Report given to RN and Post -op Vital signs reviewed and stable  Post vital signs: Reviewed and stable  Last Vitals:  Vitals Value Taken Time  BP    Temp    Pulse    Resp    SpO2      Last Pain:  Vitals:   10/02/21 1444  TempSrc: Temporal  PainSc:          Complications: No notable events documented.

## 2021-10-02 NOTE — Anesthesia Procedure Notes (Signed)
Date/Time: 10/02/2021 3:12 PM Performed by: Dorann Lodge, CRNA Pre-anesthesia Checklist: Patient identified, Emergency Drugs available, Suction available, Patient being monitored and Timeout performed Patient Re-evaluated:Patient Re-evaluated prior to induction Oxygen Delivery Method: Circle system utilized Preoxygenation: Pre-oxygenation with 100% oxygen Induction Type: Tracheostomy Airway Equipment and Method: Tracheostomy Dental Injury: Teeth and Oropharynx as per pre-operative assessment  Comments: Anesthesia circuit connected to existing tracheostomy. 100% O2 via circuit administered, +ETCO2 obtained. inhalation induction with Sevoflurane with no complications.

## 2021-10-02 NOTE — Progress Notes (Signed)
Nutrition Follow-up  DOCUMENTATION CODES:   Obesity unspecified  INTERVENTION:   Tube feeding via PEG D/C Vital AF 1.2   Jevity 1.5 @ 50 ml/hr (1200 ml/day) Prosource TF 90 ml BID  Provides 1960 kcal, 119 gm protein, 912 ml free water daily  200 ml free water every 3 hours  Total free water: 2512 ml   NUTRITION DIAGNOSIS:   Inadequate oral intake related to inability to eat as evidenced by NPO status. Ongoing.   GOAL:   Patient will meet greater than or equal to 90% of their needs Met with TF at goal   MONITOR:   TF tolerance, Weight trends  REASON FOR ASSESSMENT:   Consult, Ventilator Enteral/tube feeding initiation and management  ASSESSMENT:   Pt with PMH of Afib on coumadin, HTN, HLD, previous smoker, previous CVA, DM, OSA now admitted with R thalamic bleed with intraventricular extension.   Pt discussed during ICU rounds and with RN. Plan for PEG today at 2 pm. Pt on trach collar since yesterday.   1/23 s/p cortrak placement; per xray tip in pyloric bulb  1/31 s/p trach   Medications reviewed and include: nutrisource fiber, SSI, novolog 5 units every 4 hours, levemir 27 units BID, protonix  Labs reviewed: TG: 330 A1C: 9.7 (1/23) CBG's: 78-146   UOP: 1940 ml   Diet Order:   Diet Order             Diet NPO time specified  Diet effective midnight                   EDUCATION NEEDS:   No education needs have been identified at this time  Skin:  Skin Assessment: Reviewed RN Assessment  Last BM:  400 ml via rectal tube  Height:   Ht Readings from Last 1 Encounters:  09/20/21 _0  (1.626 m)    Weight:   Wt Readings from Last 1 Encounters:  09/30/21 92.6 kg    BMI:  Body mass index is 35.04 kg/m.  Estimated Nutritional Needs:   Kcal:  1800-2000  Protein:  110-120 grams  Fluid:  >1.8L /day  Lockie Pares., RD, LDN, CNSC See AMiON for contact information

## 2021-10-02 NOTE — Progress Notes (Signed)
NAME:  Caleb Vaughan, MRN:  [id number], DOB:  1960-11-26, LOS: 12 ADMISSION DATE:  09/20/2021, CONSULTATION DATE:  09/20/2021 REFERRING MD:  Dr. Carren Rang, CHIEF COMPLAINT:  Hemorrhagic stroke    History of Present Illness:  Caleb Vaughan is a 61 y.o. male with PMH listed below  who presented to the emergency department 1/22 as a code stroke, last known normal 2 AM.   Stroke CT on admit revealed, positive for acute right thalamic hemorrhage with intraventricular extension with estimated intra-axial blood volume 25 mL. Mild Regional brain edema. Trace leftward midline shift. Patient was intubated in ED and PCCM was consulted for further assessment.   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 Intubated in ED for increasing somnolence and inability to protect airway.  1/24 increased respiratory distress requiring increased sedation and intermittent neuromuscular blockade.  Improved with diuresis.  3% saline stopped for increasing sodium above 159 1/29 Continue to tolerated wean but gets agitated with decreased sedation, failed extubation 2/2 secretions, foley placed for urinary retention, tmax 102 1/30 PICC removed, increased bethanechol, weaned x 12 hrs  1/31 trach  2/1 trach collar for a few hours, back on vent after PT. Started rocephin for fever + WBC 14 (?UTI)   Interim History / Subjective:  NAEO  On vent overnight Temp 99.5 -- 2.6g APAP   Objective   Blood pressure (!) 154/69, pulse 90, temperature 99.6 F (37.6 C), temperature source Oral, resp. rate (!) 25, height 5\' 4"  (1.626 m), weight 92.6 kg, SpO2 97 %.    FiO2 (%):  [40 %] 40 %   Intake/Output Summary (Last 24 hours)  at 10/02/2021 1018 Last data filed at 10/02/2021 0600 Gross per 24 hour  Intake 1950.06 ml  Output 2125 ml  Net -174.94 ml   Filed Weights   09/25/21 0500 09/27/21 0500 09/30/21 0126  Weight: 95 kg 94.7 kg 92.6 kg   Examination:  General:  acutely ill middle aged M reclined in bed NAD  HEENT: NCAT pink mm Trach secure anicteric sclera, Cortrak. Neuro: Does not open eyes. PERRL 39mm. Following RUE RLE commands  CV: rr s1s2 no rgm cap refill brisk  PULM:  symmetrical chest expansion, even unlabored on ATC  GI: soft ndnt + bowel sounds  Extremities: no acute joint deformity. Non-pitting edema  Skin: c/d/w no rash    Ancillary tests   1/23 MRI >  Stable right thalamic hemorrhage since presentation. Regional edema, tracking into the midbrain. 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 No increased intracranial mass effect. 1/23 MRA>  Abundant chronic micro-hemorrhages in the brain superimposed on chronic lacunar infarcts compatible with advanced small vessel disease 1/23 TTE> LVEF 60-65, no wall abnormalities, normal RV 1/27 CT head: No progression r thalamocapsular hematoma with intraventricular extension. No hydrocephalus, or mass effect    Assessment & Plan:   Nontraumatic R thalamic hemorrhage Cerebral edema  -etiology suspected 2/2 HTN, coumadin  P: - stroke service primary  - SBP goal < 160  -wean sedating meds as able -- will dc PRN fent   Acute respiratory failure with hypoxia requiring mechanical ventilation in setting of thalamic hemorrhage S/p Tracheostomy (1/31)  Suspected aspiration event (1/29) Hx  OSA Hx Tracheal stenosis  -failed extubation 1/29 due to poor secretion management, possible aspirated at this time -s/p trach 1/31 -on continuous trach collar starting 2/2  P: -Cont ATC  -Pulm hygiene -routine trach care   -PRN BZD + TID oxy   pAfib -- now sinus  HTN HLD  P: - cont flecanide -cardiac monitoring -Mag >2 K > 4 -norvasc, coreg,  hydral   DM II  P: -Levemir 27unit BID -rSSI -q4hr TF coverage -- 5units lovenog   Urinary retention  - continue foley, reinserted 1/29.  This is his third foley due to retention.  P -cont bethanechol - may need uro this admit   Fever with leukocytosis  -infectious (suspected aspiration 1/29, repeated foleys this admit for retention) vs central (thalamic stroke)  -looks like he has had intermittent fever, leukocytosis this admission which might argue more toward central  P: -cont rocephin -- plan for 5d course  -trend WBC, fever curve -cooling blanket, APAP PRN   Diarrhea P -adding imodium   Inadequate PO intake P -PEG 2/3  -hold EN for procedure, then EN per RDN  Dispo: -has been on trach collar >24 hours as of 2/3. May be ready to transfer out of ICU 2/3 vs the coming days , will follow up after PEG placement.   Best Practice (right click and "Reselect all SmartList Selections" daily)   Diet/type: tubefeeds, w/ fiber  DVT prophylaxis: SCD and heparin [place]  GI prophylaxis: PPI Lines: N/A Foley:  Yes, and it is still needed Code Status:  full code Last date of multidisciplinary goals of care discussion:   2/2   CCT: n/a  Eliseo Gum MSN, AGACNP-BC [place] for pager  10/02/2021, 10:18 AM

## 2021-10-02 NOTE — Progress Notes (Signed)
Trauma/Critical Care Follow Up Note  Subjective:    Overnight Issues:   Objective:  Vital signs for last 24 hours: Temp:  [99.6 F (37.6 C)-101.1 F (38.4 C)] 99.6 F (37.6 C) (02/03 0800) Pulse Rate:  [78-95] 90 (02/03 0900) Resp:  [18-36] 25 (02/03 0900) BP: (132-183)/(57-136) 154/69 (02/03 0900) SpO2:  [94 %-99 %] 97 % (02/03 0900) FiO2 (%):  [40 %] 40 % (02/03 0726)  Hemodynamic parameters for last 24 hours:    Intake/Output from previous day: 02/02 0701 - 02/03 0700 In: 2150.1 [NG/GT:2050; IV Piggyback:100.1] Out: 2340 [Urine:1940; Stool:400]  Intake/Output this shift: No intake/output data recorded.  Vent settings for last 24 hours: FiO2 (%):  [40 %] 40 %  Physical Exam:  Gen: comfortable, no distress Neuro: non-focal exam HEENT: PERRL Neck: trached CV: RRR Pulm: unlabored breathing Abd: soft, NT GU: clear yellow urine Extr: wwp, no edema   Results for orders placed or performed during the hospital encounter of 09/20/21 (from the past 24 hour(s))  Glucose, capillary     Status: Abnormal   Collection Time: 10/01/21 11:34 AM  Result Value Ref Range   Glucose-Capillary 138 (H) 70 - 99 mg/dL  Glucose, capillary     Status: Abnormal   Collection Time: 10/01/21  3:48 PM  Result Value Ref Range   Glucose-Capillary 135 (H) 70 - 99 mg/dL  Glucose, capillary     Status: Abnormal   Collection Time: 10/01/21  7:07 PM  Result Value Ref Range   Glucose-Capillary 146 (H) 70 - 99 mg/dL  Glucose, capillary     Status: Abnormal   Collection Time: 10/01/21 11:04 PM  Result Value Ref Range   Glucose-Capillary 142 (H) 70 - 99 mg/dL  Glucose, capillary     Status: None   Collection Time: 10/02/21  3:03 AM  Result Value Ref Range   Glucose-Capillary 78 70 - 99 mg/dL  Basic metabolic panel     Status: Abnormal   Collection Time: 10/02/21  4:09 AM  Result Value Ref Range   Sodium 143 135 - 145 mmol/L   Potassium 4.0 3.5 - 5.1 mmol/L   Chloride 114 (H) 98 - 111  mmol/L   CO2 20 (L) 22 - 32 mmol/L   Glucose, Bld 78 70 - 99 mg/dL   BUN 22 (H) 6 - 20 mg/dL   Creatinine, Ser 6.16 0.61 - 1.24 mg/dL   Calcium 9.0 8.9 - 07.3 mg/dL   GFR, Estimated >71 >06 mL/min   Anion gap 9 5 - 15  CBC     Status: Abnormal   Collection Time: 10/02/21  4:09 AM  Result Value Ref Range   WBC 15.3 (H) 4.0 - 10.5 K/uL   RBC 3.65 (L) 4.22 - 5.81 MIL/uL   Hemoglobin 11.4 (L) 13.0 - 17.0 g/dL   HCT 26.9 (L) 48.5 - 46.2 %   MCV 91.0 80.0 - 100.0 fL   MCH 31.2 26.0 - 34.0 pg   MCHC 34.3 30.0 - 36.0 g/dL   RDW 70.3 50.0 - 93.8 %   Platelets 293 150 - 400 K/uL   nRBC 0.0 0.0 - 0.2 %  Glucose, capillary     Status: None   Collection Time: 10/02/21  7:25 AM  Result Value Ref Range   Glucose-Capillary 96 70 - 99 mg/dL    Assessment & Plan: The plan of care was discussed with the bedside nurse for the day, who is in agreement with this plan and no additional concerns were  raised.   Present on Admission:  Nontraumatic thalamic hemorrhage (HCC)  Respiratory failure with hypoxia (HCC)  (Resolved) Aspiration pneumonitis (HCC)  Paroxysmal A-fib (HCC)  Obstructive sleep apnea    LOS: 12 days   Additional comments:I reviewed the patient's new clinical lab test results.   and I reviewed the patients new imaging test results.    Hemorrhagic CVA PEG today. Informed consent obtained and in the chart.  FEN - TF held since MN VTE - heparin ID - Rocephin   Respiratory failure A fib DM HTN  Diamantina Monks, MD Trauma & General Surgery Please use [email or web address] to contact on call provider  10/02/2021  *Care during the described time interval was provided by me. I have reviewed this patient's available data, including medical history, events of note, physical examination and test results as part of my evaluation.

## 2021-10-03 DIAGNOSIS — I619 Nontraumatic intracerebral hemorrhage, unspecified: Secondary | ICD-10-CM | POA: Diagnosis not present

## 2021-10-03 DIAGNOSIS — Z931 Gastrostomy status: Secondary | ICD-10-CM

## 2021-10-03 DIAGNOSIS — Z7901 Long term (current) use of anticoagulants: Secondary | ICD-10-CM | POA: Diagnosis not present

## 2021-10-03 DIAGNOSIS — I4821 Permanent atrial fibrillation: Secondary | ICD-10-CM | POA: Diagnosis not present

## 2021-10-03 DIAGNOSIS — N179 Acute kidney failure, unspecified: Secondary | ICD-10-CM | POA: Diagnosis not present

## 2021-10-03 LAB — BASIC METABOLIC PANEL
Anion gap: 11 (ref 5–15)
BUN: 31 mg/dL — ABNORMAL HIGH (ref 6–20)
CO2: 21 mmol/L — ABNORMAL LOW (ref 22–32)
Calcium: 9.3 mg/dL (ref 8.9–10.3)
Chloride: 109 mmol/L (ref 98–111)
Creatinine, Ser: 0.94 mg/dL (ref 0.61–1.24)
GFR, Estimated: >60 mL/min (ref >60)
Glucose, Bld: 208 mg/dL — ABNORMAL HIGH (ref 70–99)
Potassium: 4.3 mmol/L (ref 3.5–5.1)
Sodium: 141 mmol/L (ref 135–145)

## 2021-10-03 LAB — GLUCOSE, CAPILLARY
Glucose-Capillary: 298 mg/dL — ABNORMAL HIGH (ref 70–99)
Glucose-Capillary: 229 mg/dL — ABNORMAL HIGH (ref 70–99)
Glucose-Capillary: 206 mg/dL — ABNORMAL HIGH (ref 70–99)
Glucose-Capillary: 158 mg/dL — ABNORMAL HIGH (ref 70–99)
Glucose-Capillary: 189 mg/dL — ABNORMAL HIGH (ref 70–99)

## 2021-10-03 LAB — CBC
HCT: 34.3 % — ABNORMAL LOW (ref 39.0–52.0)
Hemoglobin: 11.2 g/dL — ABNORMAL LOW (ref 13.0–17.0)
MCH: 29.9 pg (ref 26.0–34.0)
MCHC: 32.7 g/dL (ref 30.0–36.0)
MCV: 91.5 fL (ref 80.0–100.0)
Platelets: 377 10*3/uL (ref 150–400)
RBC: 3.75 MIL/uL — ABNORMAL LOW (ref 4.22–5.81)
RDW: 12.9 % (ref 11.5–15.5)
WBC: 14.2 10*3/uL — ABNORMAL HIGH (ref 4.0–10.5)
nRBC: 0 % (ref 0.0–0.2)

## 2021-10-03 NOTE — Progress Notes (Addendum)
STROKE TEAM PROGRESS NOTE   INTERVAL HISTORY Patient is seen in his room with family at the bedside.  He is on trach collar and appears to be doing well.  His neurological exam is unchanged, Right gaze preference  Left minimal movement to painful stimuli.  WBC decreased to 14.2. Afebrile overnight.  PEG tube placed yesterday.  No family at the bedside.  Vital signs stable.  Blood pressure adequately controlled  Vitals:   10/03/21 0811 10/03/21 0839 10/03/21 0900 10/03/21 1000  BP: (!) 166/59 (!) 147/60 (!) 155/59 (!) 152/57  Pulse: 91 87 88 85  Resp: (!) 22 (!) 26 (!) 25 (!) 31  Temp:      TempSrc:      SpO2: 95% 95% (!) 87% 95%  Weight:      Height:       CBC:  Recent Labs  Lab 09/27/21 0936 09/27/21 1927 10/01/21 0614 10/02/21 0409 10/03/21 0746  WBC 11.7*   < > 14.2* 15.3* 14.2*  NEUTROABS 8.7*  --  11.4*  --   --   HGB 12.5*   < > 11.1* 11.4* 11.2*  HCT 38.8*   < > 32.7* 33.2* 34.3*  MCV 94.9   < > 90.6 91.0 91.5  PLT 228   < > 257 293 377   < > = values in this interval not displayed.    Basic Metabolic Panel:  Recent Labs  Lab 09/29/21 0358 09/30/21 0707 10/01/21 0614 10/02/21 0409 10/03/21 0746  NA 140 141   < > 143 141  K 3.9 3.6   < > 4.0 4.3  CL 111 111   < > 114* 109  CO2 20* 20*   < > 20* 21*  GLUCOSE 122* 132*   < > 78 208*  BUN 32* 36*   < > 22* 31*  CREATININE 0.99 1.09   < > 0.87 0.94  CALCIUM 8.9 8.9   < > 9.0 9.3  MG 2.6* 2.6*  --   --   --   PHOS 3.3  --   --   --   --    < > = values in this interval not displayed.    Lipid Panel:  No results for input(s): CHOL, TRIG, HDL, CHOLHDL, VLDL, LDLCALC in the last 168 hours.  HgbA1c: No results for input(s): HGBA1C in the last 168 hours.  Urine Drug Screen:  No results for input(s): LABOPIA, COCAINSCRNUR, LABBENZ, AMPHETMU, THCU, LABBARB in the last 168 hours.   Alcohol Level No results for input(s): ETH in the last 168 hours.  IMAGING past 24 hours No results found.  PHYSICAL  EXAM  Physical Exam  Constitutional: Appears well-developed and well-nourished.  Middle-aged Caucasian male not in distress.  Status post tracheostomy Cardiovascular: Normal rate and regular rhythm.  Respiratory: Respirations even and unlabored on trach collar  Neuro: Eyes closed but easily arousable and follows, but does not open eyes spontaneously.  Will follow commands with RUE and RLE, no movement of LUE and LLE with hypotonia and dense left hemiplegia  ASSESSMENT/PLAN Caleb Vaughan is a 61 y.o. male with history of AFib on Coumadin s/p recent conversion, HTN, HLD, remote tobacco abuse, Cataract OU, glaucoma, obesity, CVA, DM II, and OSA who initially presented to the ED via EMS left sided weakness, dysarthria, and right fixed gaze. Coumadin reversed. Labetalol and cleviprex used for BP greater than 140 during CT. Patient was then intubated due to somnolence and inability to protect his airway. CT  and MRI show stable right thalamic hemorrhage with intraventricular extension. ICH score 2. Febrile. Hypertonic saline discontinued 1/23. Most recent sodium is 155.  Currently in NSR with flecainide continued. Holding home antihypertensive medications currently. No longer using cleviprex or norepinephrine (r/t hypotension after RSI) for BP control. Repeat CT shows no significant change in the hemorrhage or ventricle size. Weaning 1/28 and 1/29 at 30% , 5/5. Extubated 1/29 AM and reintubated 1/29 PM due to copious secretions.  Tracheostomy was placed 1/31. PEG tube placed 2/3. He remains on trach collar, plan to move out of ICU tomorrow and then to acute inpatient rehab.   Stroke:  Acute right thalamic ICH and IVH likely secondary to hypertension in the setting of coumadin use Code stroke CT - Positive for acute right thalamic hemorrhage with intraventricular extension. Estimated intra-axial blood volume 25 mL. Mild Regional brain edema. Trace leftward midline shift. 1/22 Repeat CT shows no changes  from code stroke CT MRI  Stable right thalamic hemorrhage since presentation. Regional edema, tracking into the midbrain. 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 No increased intracranial mass effect. MRA  Abundant chronic micro-hemorrhages in the brain superimposed on chronic lacunar infarcts compatible with advanced small vessel disease CT head 1/27 - unchanged hematoma and ventricular size Carotid Doppler  1-39% stenosis in Left and Right ICA 2D Echo EF 60-65% LDL 52 HgbA1c 9.7 VTE prophylaxis - heparin warfarin daily prior to admission, now on No antithrombotic due to [place] Therapy recommendations:  CIR Disposition:  pending  Cerebral Edema with left midline shift Stable MRI and repeat CT scan Hypertonic saline discontinued d/c Na serial checks Allow Na trending down gradually  Free water flushes given 200cc->400cc q4h->200 Q3h  Acute hypoxemic respiratory failure CCM on board Difficult intubation r/t tracheal stenosis Extubated 1/29 and reintubated Tracheostomy 1/31 PEG 2/3  Atrial Fibrillation Home meds: Flecainide, coreg, coumadin Rate controlled now on No antithrombotic due to [place]  Fever, improved Blood culture 1/2 staph epidermidis Repeat blood culture no growth Tmax 102.4->100.7 WBC 15.3->14.2  On rocephin  Hypertension Home meds:  Amlodipine, coreg, losartan-hydrochlorothiazide BP goal less than 160 On coreg 25 bid, hydralazine 50 Q8h, norvasc 10 Long term BP goal normotensive  Hyperlipidemia Home meds: Lipitor 80 LDL 52, goal < 70 Consider to resume Lipitor on discharge  Diabetes type II Uncontrolled Home meds:  Metformin, jardiance HgbA1c 9.7 , goal < 7.0 CBGs SSI On levemir Close PCP follow-up as outpatient for better DM control  Dysphagia  PEG 2/3 On TF @ 50  Other Stroke Risk Factors Former smoker - quit 20 years ago Obesity, Body mass index is 35.04 kg/m., BMI >/= 30 associated with increased stroke risk, recommend weight  loss, diet and exercise as appropriate  Obstructive sleep apnea  Other Active Problems   Hospital day # 13  Patient seen and examined by NP/APP with MD. MD to update note as needed.   Janine Ores, DNP, FNP-BC [facility] Pager: [phone]   ATTENDING NOTE: I reviewed above note and agree with the assessment and plan. Pt was seen and examined.   Patient aunt and uncle are at bedside.  Patient laying in bed, on trach collar, tolerating well although still has copious secretions.  Had PEG tube yesterday on tube feeding and free water.  Patient still lethargic, but able to open eyes to voice, right gaze preference, not able to cross midline, able to follow commands on the right hand and foot, still has left hemiplegia, only mild withdrawal  to pain stimulation.  Fever much improved, currently afebrile, overnight Tmax 100.7.  On Rocephin.  WBC trending down from 15.3-14.2.  UA negative.  On Levemir but still has hyperglycemia.  BP under control on 3 BP meds.  Sodium normalized.  Continue current management, appreciate CCM assistance.  Plan to transfer out of ICU tomorrow if stable.  For detailed assessment and plan, please refer to above as I have made changes wherever appropriate.   Rosalin Hawking, MD PhD Stroke Neurology 10/03/2021 5:06 PM  This patient is critically ill due to [place], IVH, PAF on Coumadin, cerebral edema, respiratory failure, fever and at significant risk of neurological worsening, death form hematoma expansion, brain herniation, obstructive hydrocephalus, stroke, heart failure, seizure, sepsis. This patient's care requires constant monitoring of vital signs, hemodynamics, respiratory and cardiac monitoring, review of multiple databases, neurological assessment, discussion with family, other specialists and medical decision making of high complexity. I spent 35 minutes of neurocritical care time in the care of this patient. I had long discussion with aunt and uncle  at bedside, updated pt current condition, treatment plan and potential prognosis, and answered all the questions.  They expressed understanding and appreciation.     To contact Stroke Continuity provider, please refer to [email or web address] After hours, contact General Neurology

## 2021-10-03 NOTE — Progress Notes (Signed)
° °  Trauma/Critical Care Follow Up Note  Subjective:    Overnight Issues: No issues. PEG placed yesterday at bedside. Feeds running at 50 this morning.  Objective:  Vital signs for last 24 hours: Temp:  [98.4 F (36.9 C)-99.6 F (37.6 C)] 99.1 F (37.3 C) (02/04 0400) Pulse Rate:  [69-101] 87 (02/04 0600) Resp:  [20-34] 20 (02/04 0600) BP: (119-183)/(51-77) 146/64 (02/04 0600) SpO2:  [86 %-98 %] 96 % (02/04 0600) FiO2 (%):  [40 %] 40 % (02/04 0413)  Hemodynamic parameters for last 24 hours:    Intake/Output from previous day: 02/03 0701 - 02/04 0700 In: 2100.1 [I.V.:400; NG/GT:1600; IV Piggyback:100.1] Out: 2765 [Urine:2690; Stool:75]  Intake/Output this shift: Total I/O In: 1450.1 [NG/GT:1350; IV Piggyback:100.1] Out: 1400 [Urine:1400]  Vent settings for last 24 hours: FiO2 (%):  [40 %] 40 %  Physical Exam:  Gen: comfortable, no distress Neck: trach in place CV: RRR Pulm: unlabored breathing Abd: soft, nontender, nondistended, PEG in place upper abdomen at 3cm at skin, surrounding skin is clean and dry, feeds running at 50 ml/hr.   Results for orders placed or performed during the hospital encounter of 09/20/21 (from the past 24 hour(s))  Glucose, capillary     Status: None   Collection Time: 10/02/21  7:25 AM  Result Value Ref Range   Glucose-Capillary 96 70 - 99 mg/dL  Glucose, capillary     Status: Abnormal   Collection Time: 10/02/21 11:19 AM  Result Value Ref Range   Glucose-Capillary 133 (H) 70 - 99 mg/dL  Glucose, capillary     Status: Abnormal   Collection Time: 10/02/21  5:27 PM  Result Value Ref Range   Glucose-Capillary 192 (H) 70 - 99 mg/dL  Glucose, capillary     Status: Abnormal   Collection Time: 10/02/21  8:30 PM  Result Value Ref Range   Glucose-Capillary 316 (H) 70 - 99 mg/dL  Glucose, capillary     Status: Abnormal   Collection Time: 10/02/21 11:11 PM  Result Value Ref Range   Glucose-Capillary 344 (H) 70 - 99 mg/dL  Glucose, capillary      Status: Abnormal   Collection Time: 10/03/21  4:11 AM  Result Value Ref Range   Glucose-Capillary 298 (H) 70 - 99 mg/dL    Assessment & Plan: The plan of care was discussed with the bedside nurse for the day, who is in agreement with this plan and no additional concerns were raised.   Present on Admission:  Nontraumatic thalamic hemorrhage (HCC)  Respiratory failure with hypoxia (HCC)  (Resolved) Aspiration pneumonitis (HCC)  Paroxysmal A-fib (HCC)  Obstructive sleep apnea    LOS: 13 days    Hemorrhagic CVA S/p PEG placement 2/3 by Dr. Bedelia Person. Tube appears appropriately positioned this morning, tolerating feeds via PEG tube at goal. Surgery will sign off at this point, please call with any further questions or concerns.   Sophronia Simas, MD [facility] Surgery General, Hepatobiliary and Pancreatic Surgery 10/03/21 6:40 AM

## 2021-10-04 DIAGNOSIS — Z7901 Long term (current) use of anticoagulants: Secondary | ICD-10-CM | POA: Diagnosis not present

## 2021-10-04 DIAGNOSIS — I4821 Permanent atrial fibrillation: Secondary | ICD-10-CM | POA: Diagnosis not present

## 2021-10-04 DIAGNOSIS — I619 Nontraumatic intracerebral hemorrhage, unspecified: Secondary | ICD-10-CM | POA: Diagnosis not present

## 2021-10-04 DIAGNOSIS — N179 Acute kidney failure, unspecified: Secondary | ICD-10-CM | POA: Diagnosis not present

## 2021-10-04 LAB — CBC
HCT: 33.7 % — ABNORMAL LOW (ref 39.0–52.0)
Hemoglobin: 10.9 g/dL — ABNORMAL LOW (ref 13.0–17.0)
MCH: 29.8 pg (ref 26.0–34.0)
MCHC: 32.3 g/dL (ref 30.0–36.0)
MCV: 92.1 fL (ref 80.0–100.0)
Platelets: 411 10*3/uL — ABNORMAL HIGH (ref 150–400)
RBC: 3.66 MIL/uL — ABNORMAL LOW (ref 4.22–5.81)
RDW: 13 % (ref 11.5–15.5)
WBC: 15.4 10*3/uL — ABNORMAL HIGH (ref 4.0–10.5)
nRBC: 0 % (ref 0.0–0.2)

## 2021-10-04 LAB — BASIC METABOLIC PANEL
Anion gap: 10 (ref 5–15)
BUN: 36 mg/dL — ABNORMAL HIGH (ref 6–20)
CO2: 23 mmol/L (ref 22–32)
Calcium: 8.9 mg/dL (ref 8.9–10.3)
Chloride: 107 mmol/L (ref 98–111)
Creatinine, Ser: 1.01 mg/dL (ref 0.61–1.24)
GFR, Estimated: >60 mL/min (ref >60)
Glucose, Bld: 193 mg/dL — ABNORMAL HIGH (ref 70–99)
Potassium: 3.9 mmol/L (ref 3.5–5.1)
Sodium: 140 mmol/L (ref 135–145)

## 2021-10-04 LAB — GLUCOSE, CAPILLARY
Glucose-Capillary: 140 mg/dL — ABNORMAL HIGH (ref 70–99)
Glucose-Capillary: 146 mg/dL — ABNORMAL HIGH (ref 70–99)
Glucose-Capillary: 180 mg/dL — ABNORMAL HIGH (ref 70–99)
Glucose-Capillary: 181 mg/dL — ABNORMAL HIGH (ref 70–99)
Glucose-Capillary: 184 mg/dL — ABNORMAL HIGH (ref 70–99)
Glucose-Capillary: 198 mg/dL — ABNORMAL HIGH (ref 70–99)

## 2021-10-04 NOTE — Progress Notes (Addendum)
STROKE TEAM PROGRESS NOTE   INTERVAL HISTORY Patient is seen in his room with no family at the bedside.  He has been hemodynamically stable overnight, and his neurological exam is unchanged.  He was febrile overnight, and WBC remains elevated at 15.4.  He is doing well on trach collar.  Plan is to transfer patient out of the ICU today.  Vitals:   10/04/21 0800 10/04/21 0900 10/04/21 1000 10/04/21 1145  BP: (!) 152/74 (!) 142/67 117/65 (!) 151/74  Pulse: 95 83 81 84  Resp: (!) 28 (!) 31 (!) 28 (!) 28  Temp: (!) 100.4 F (38 C)     TempSrc: Axillary     SpO2: 93% (!) 89% 94% 93%  Weight:      Height:       CBC:  Recent Labs  Lab 10/01/21 0614 10/02/21 0409 10/03/21 0746 10/04/21 0232  WBC 14.2*   < > 14.2* 15.4*  NEUTROABS 11.4*  --   --   --   HGB 11.1*   < > 11.2* 10.9*  HCT 32.7*   < > 34.3* 33.7*  MCV 90.6   < > 91.5 92.1  PLT 257   < > 377 411*   < > = values in this interval not displayed.    Basic Metabolic Panel:  Recent Labs  Lab 09/29/21 0358 09/30/21 0707 10/01/21 0614 10/03/21 0746 10/04/21 0232  NA 140 141   < > 141 140  K 3.9 3.6   < > 4.3 3.9  CL 111 111   < > 109 107  CO2 20* 20*   < > 21* 23  GLUCOSE 122* 132*   < > 208* 193*  BUN 32* 36*   < > 31* 36*  CREATININE 0.99 1.09   < > 0.94 1.01  CALCIUM 8.9 8.9   < > 9.3 8.9  MG 2.6* 2.6*  --   --   --   PHOS 3.3  --   --   --   --    < > = values in this interval not displayed.    Lipid Panel:  No results for input(s): CHOL, TRIG, HDL, CHOLHDL, VLDL, LDLCALC in the last 168 hours.  HgbA1c: No results for input(s): HGBA1C in the last 168 hours.  Urine Drug Screen:  No results for input(s): LABOPIA, COCAINSCRNUR, LABBENZ, AMPHETMU, THCU, LABBARB in the last 168 hours.   Alcohol Level No results for input(s): ETH in the last 168 hours.  IMAGING past 24 hours No results found.  PHYSICAL EXAM  Physical Exam  Constitutional: Appears well-developed and well-nourished.  Middle-aged Caucasian  male not in distress.  Status post tracheostomy Cardiovascular: Normal rate and regular rhythm.  Respiratory: Respirations even and unlabored on trach collar  Neuro: Eyes closed but easily arousable and follows, but does not open eyes spontaneously.  Will follow commands with RUE and RLE, no movement of LUE and LLE with hypotonia and dense left hemiplegia  ASSESSMENT/PLAN Mr. Ilario Marlo is a 61 y.o. male with history of AFib on Coumadin s/p recent conversion, HTN, HLD, remote tobacco abuse, Cataract OU, glaucoma, obesity, CVA, DM II, and OSA who initially presented to the ED via EMS left sided weakness, dysarthria, and right fixed gaze. Coumadin reversed. Labetalol and cleviprex used for BP greater than 140 during CT. Patient was then intubated due to somnolence and inability to protect his airway. CT and MRI show stable right thalamic hemorrhage with intraventricular extension. ICH score 2. Febrile. Hypertonic saline  discontinued 1/23. Most recent sodium is 155.  Currently in NSR with flecainide continued. Holding home antihypertensive medications currently. No longer using cleviprex or norepinephrine (r/t hypotension after RSI) for BP control. Repeat CT shows no significant change in the hemorrhage or ventricle size. Weaning 1/28 and 1/29 at 30% , 5/5. Extubated 1/29 AM and reintubated 1/29 PM due to copious secretions.  Tracheostomy was placed 1/31. PEG tube placed 2/3. He remains on trach collar, plan to move out of ICU today and then to acute inpatient rehab.   Stroke:  Acute right thalamic ICH and IVH likely secondary to hypertension in the setting of coumadin use Code stroke CT - Positive for acute right thalamic hemorrhage with intraventricular extension. Estimated intra-axial blood volume 25 mL. Mild Regional brain edema. Trace leftward midline shift. 1/22 Repeat CT shows no changes from code stroke CT MRI  Stable right thalamic hemorrhage since presentation. Regional edema, tracking into  the midbrain. 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 No increased intracranial mass effect. MRA  Abundant chronic micro-hemorrhages in the brain superimposed on chronic lacunar infarcts compatible with advanced small vessel disease CT head 1/27 - unchanged hematoma and ventricular size Carotid Doppler  1-39% stenosis in Left and Right ICA 2D Echo EF 60-65% LDL 52 HgbA1c 9.7 VTE prophylaxis - heparin warfarin daily prior to admission, now on No antithrombotic due to [place] Therapy recommendations:  CIR Disposition:  pending  Cerebral Edema with left midline shift Stable MRI and repeat CT scan Hypertonic saline discontinued d/c Na serial checks Allow Na trending down gradually  Free water flushes given 200cc->400cc q4h->200 Q3h  Acute hypoxemic respiratory failure CCM on board Difficult intubation r/t tracheal stenosis Extubated 1/29 and reintubated Tracheostomy 1/31 PEG 2/3  Atrial Fibrillation Home meds: Flecainide, coreg, coumadin Rate controlled now on No antithrombotic due to [place]  Fever, improved Blood culture 1/2 staph epidermidis Repeat blood culture no growth Tmax 102.4->100.7 -> 100.4 WBC 15.3->14.2 -> 15.4 On rocephin  Hypertension Home meds:  Amlodipine, coreg, losartan-hydrochlorothiazide BP goal less than 160 On coreg 25 bid, hydralazine 50 Q8h, norvasc 10 Long term BP goal normotensive  Hyperlipidemia Home meds: Lipitor 80 LDL 52, goal < 70 Consider to resume Lipitor on discharge  Diabetes type II Uncontrolled Home meds:  Metformin, jardiance HgbA1c 9.7 , goal < 7.0 CBGs SSI On levemir Close PCP follow-up as outpatient for better DM control  Dysphagia  PEG 2/3 On TF @ 66 and FW  Other Stroke Risk Factors Former smoker - quit 20 years ago Obesity, Body mass index is 35.04 kg/m., BMI >/= 30 associated with increased stroke risk, recommend weight loss, diet and exercise as appropriate  Obstructive sleep apnea  Other Active  Problems   Hospital day # 14  Patient seen and examined by NP/APP with MD. MD to update note as needed.   [place] , MSN, AGACNP-BC [facility] See Amion for schedule and pager information 10/04/2021 12:04 PM   ATTENDING NOTE: I reviewed above note and agree with the assessment and plan. Pt was seen and examined.   No family at bedside.  Patient seems more drowsy and sleepy than yesterday but RN reported patient just had bath and was exhausted.  However, patient still follow commands on the right hand and the food.  Still has left hemiplegia.  Tolerating was trach collar well.  Tmax today 100.4, leukocytosis stable WBC 15.4, on last day of Rocephin.  BP stable.  Will transfer out of ICU.  For detailed assessment and plan, please refer to above as I have made changes wherever appropriate.   Rosalin Hawking, MD PhD Stroke Neurology 10/04/2021 2:19 PM  This patient is critically ill due to [place], IVH, PAF not on AC now, cervical edema, respiratory failure status post trach, fever and leukocytosis and at significant risk of neurological worsening, death form hematoma expansion, brain herniation, hydrocephalus, recurrent stroke, heart failure, seizure, sepsis. This patient's care requires constant monitoring of vital signs, hemodynamics, respiratory and cardiac monitoring, review of multiple databases, neurological assessment, discussion with family, other specialists and medical decision making of high complexity. I spent 35 minutes of neurocritical care time in the care of this patient.    To contact Stroke Continuity provider, please refer to [email or web address] After hours, contact General Neurology

## 2021-10-04 NOTE — Progress Notes (Signed)
Son, Casimiro Needle, aware of transfer to [facility].

## 2021-10-05 ENCOUNTER — Encounter (HOSPITAL_COMMUNITY): Payer: Self-pay | Admitting: Neurology

## 2021-10-05 ENCOUNTER — Other Ambulatory Visit: Payer: Self-pay

## 2021-10-05 DIAGNOSIS — I4821 Permanent atrial fibrillation: Secondary | ICD-10-CM | POA: Diagnosis not present

## 2021-10-05 DIAGNOSIS — I619 Nontraumatic intracerebral hemorrhage, unspecified: Secondary | ICD-10-CM | POA: Diagnosis not present

## 2021-10-05 DIAGNOSIS — Z7901 Long term (current) use of anticoagulants: Secondary | ICD-10-CM | POA: Diagnosis not present

## 2021-10-05 DIAGNOSIS — E1159 Type 2 diabetes mellitus with other circulatory complications: Secondary | ICD-10-CM | POA: Diagnosis not present

## 2021-10-05 DIAGNOSIS — N179 Acute kidney failure, unspecified: Secondary | ICD-10-CM | POA: Diagnosis not present

## 2021-10-05 DIAGNOSIS — J9601 Acute respiratory failure with hypoxia: Secondary | ICD-10-CM | POA: Diagnosis not present

## 2021-10-05 LAB — GLUCOSE, CAPILLARY
Glucose-Capillary: 120 mg/dL — ABNORMAL HIGH (ref 70–99)
Glucose-Capillary: 120 mg/dL — ABNORMAL HIGH (ref 70–99)
Glucose-Capillary: 142 mg/dL — ABNORMAL HIGH (ref 70–99)
Glucose-Capillary: 150 mg/dL — ABNORMAL HIGH (ref 70–99)
Glucose-Capillary: 154 mg/dL — ABNORMAL HIGH (ref 70–99)
Glucose-Capillary: 175 mg/dL — ABNORMAL HIGH (ref 70–99)

## 2021-10-05 LAB — CBC
HCT: 31.8 % — ABNORMAL LOW (ref 39.0–52.0)
Hemoglobin: 10.7 g/dL — ABNORMAL LOW (ref 13.0–17.0)
MCH: 30.7 pg (ref 26.0–34.0)
MCHC: 33.6 g/dL (ref 30.0–36.0)
MCV: 91.4 fL (ref 80.0–100.0)
Platelets: 416 10*3/uL — ABNORMAL HIGH (ref 150–400)
RBC: 3.48 MIL/uL — ABNORMAL LOW (ref 4.22–5.81)
RDW: 12.9 % (ref 11.5–15.5)
WBC: 11.1 10*3/uL — ABNORMAL HIGH (ref 4.0–10.5)
nRBC: 0 % (ref 0.0–0.2)

## 2021-10-05 LAB — BASIC METABOLIC PANEL
Anion gap: 9 (ref 5–15)
BUN: 24 mg/dL — ABNORMAL HIGH (ref 6–20)
CO2: 24 mmol/L (ref 22–32)
Calcium: 8.8 mg/dL — ABNORMAL LOW (ref 8.9–10.3)
Chloride: 106 mmol/L (ref 98–111)
Creatinine, Ser: 0.92 mg/dL (ref 0.61–1.24)
GFR, Estimated: >60 mL/min (ref >60)
Glucose, Bld: 137 mg/dL — ABNORMAL HIGH (ref 70–99)
Potassium: 3.7 mmol/L (ref 3.5–5.1)
Sodium: 139 mmol/L (ref 135–145)

## 2021-10-05 MED ORDER — ENOXAPARIN SODIUM 40 MG/0.4ML IJ SOSY
40.0000 mg | PREFILLED_SYRINGE | INTRAMUSCULAR | Status: DC
  Administered 2021-10-06 – 2021-10-16 (×11): 40 mg via SUBCUTANEOUS
  Filled 2021-10-05 (×11): qty 0.4

## 2021-10-05 NOTE — Progress Notes (Signed)
Foley catheter was erroneously documented as having been removed. It has not, in fact, been removed and is still in place.

## 2021-10-05 NOTE — Progress Notes (Signed)
Occupational Therapy Treatment Patient Details Name: Caleb Vaughan MRN: [id number] DOB: [birth year] Today's Date: 10/05/2021   History of present illness Patient is a 61 y/o male admitted due to R thalamic hemorrhage, edema and trace leftward midline shift, intubated on admission, attempted extubation 1/29, re-intubated that evening.  Trach 1/31. Pt is s/p peg tube on 2/3. PMH positive for a-fib on coumadin, HTN, HLD, previous smoker, prior CVA, DM, OSA.   OT comments  Patient received in supine and was lethargic. Several attempts made to arousal patient but would not open eyes.  PROM and edema management performed to LUE with positioning to elevate LUE hand. Patient performed washing face with hand over hand to assist and active participation from patient.  Acute OT to continue to follow.    Recommendations for follow up therapy are one component of a multi-disciplinary discharge planning process, led by the attending physician.  Recommendations may be updated based on patient status, additional functional criteria and insurance authorization.    Follow Up Recommendations  Acute inpatient rehab (3hours/day)    Assistance Recommended at Discharge Frequent or constant Supervision/Assistance  Patient can return home with the following  Two people to help with walking and/or transfers;Two people to help with bathing/dressing/bathroom;Assistance with cooking/housework;Assistance with feeding;Direct supervision/assist for medications management;Direct supervision/assist for financial management;Assist for transportation;Help with stairs or ramp for entrance   Equipment Recommendations  BSC/3in1;Wheelchair (measurements OT);Wheelchair cushion (measurements OT)    Recommendations for Other Services      Precautions / Restrictions Precautions Precautions: Fall Precaution Comments: trach, peg tube, rectal tube Restrictions Weight Bearing Restrictions: No       Mobility Bed Mobility                     Transfers                         Balance                                           ADL either performed or assessed with clinical judgement   ADL Overall ADL's : Needs assistance/impaired     Grooming: Moderate assistance;Bed level Grooming Details (indicate cue type and reason): patient washed face at bed level with hand over hand to complete                               General ADL Comments: total assist for self care    Extremity/Trunk Assessment Upper Extremity Assessment LUE Deficits / Details: PROM WFL; no AROM observed; edematous L hand; elevated on 2 pillows LUE Sensation: decreased proprioception;decreased light touch LUE Coordination: decreased fine motor;decreased gross motor            Vision       Perception     Praxis      Cognition Arousal/Alertness: Lethargic Behavior During Therapy: Flat affect Overall Cognitive Status: Difficult to assess                                 General Comments: able to follow command for washing face would not open eyes        Exercises Exercises: General Upper Extremity General Exercises - Upper Extremity Shoulder Flexion: PROM, Left, 10 reps, Supine  Elbow Flexion: PROM, Left, 10 reps, Supine    Shoulder Instructions       General Comments LUE edema management and positioning performed    Pertinent Vitals/ Pain       Pain Assessment Pain Assessment: Faces Faces Pain Scale: No hurt  Home Living Family/patient expects to be discharged to:: Skilled nursing facility Living Arrangements: Children                                      Prior Functioning/Environment              Frequency  Min 2X/week        Progress Toward Goals  OT Goals(current goals can now be found in the care plan section)  Progress towards OT goals: Progressing toward goals  Acute Rehab OT Goals OT Goal Formulation: Patient  unable to participate in goal setting Time For Goal Achievement: 10/13/21 Potential to Achieve Goals: Good ADL Goals Pt Will Perform Grooming: with set-up;sitting;with supervision Pt Will Perform Upper Body Bathing: with min assist;sitting Pt Will Perform Lower Body Bathing: with mod assist;sit to/from stand Pt Will Transfer to Toilet: with mod assist;with +2 assist;bedside commode Additional ADL Goal #1: MAintain midline postural control with S to increase independence with ADL and mobility  Plan Discharge plan remains appropriate    Co-evaluation                 AM-PAC OT "6 Clicks" Daily Activity     Outcome Measure   Help from another person eating meals?: Total Help from another person taking care of personal grooming?: A Lot Help from another person toileting, which includes using toliet, bedpan, or urinal?: Total Help from another person bathing (including washing, rinsing, drying)?: Total Help from another person to put on and taking off regular upper body clothing?: Total Help from another person to put on and taking off regular lower body clothing?: Total 6 Click Score: 7    End of Session Equipment Utilized During Treatment: Oxygen  OT Visit Diagnosis: Unsteadiness on feet (R26.81);Other abnormalities of gait and mobility (R26.89);Muscle weakness (generalized) (M62.81);Other symptoms and signs involving the nervous system (R29.898);Other symptoms and signs involving cognitive function;Hemiplegia and hemiparesis Hemiplegia - Right/Left: Left Hemiplegia - dominant/non-dominant: Non-Dominant Hemiplegia - caused by: Nontraumatic intracerebral hemorrhage   Activity Tolerance Patient limited by lethargy   Patient Left in bed;with call bell/phone within reach;with bed alarm set   Nurse Communication Mobility status        Time: 8185-6314 OT Time Calculation (min): 17 min  Charges: OT General Charges $OT Visit: 1 Visit OT Treatments $Therapeutic Activity:  8-22 mins  Alfonse Flavors, OTA Acute Rehabilitation Services  Pager [phone] Office [phone]   Dewain Penning 10/05/2021, 2:57 PM

## 2021-10-05 NOTE — Progress Notes (Signed)
PROGRESS NOTE  Caleb Vaughan KJZ:[id number] DOB: 02-28-1961 DOA: 09/20/2021 PCP: Pcp, No  HPI/Recap of past 24 hours: Caleb Vaughan is a 61 y.o. male with PMH of Afib on coumadin, HTN, HLD, DM, OSA, prior CVA presented to the ED on 1/22 as a code stroke. Pt presented with left sided weakness, dysarthria, and right fixed gaze. CT on admit revealed, positive for acute right thalamic hemorrhage with intraventricular extension with mild regional brain edema, trace leftward midline shift. Neurology consulted. Patient was intubated in ED and PCCM was consulted for vent management. Pt was admitted in neuro ICU. Due to inability to wean off MVT, pt was trached on 09/29/21 and a PEG tube was placed on 10/02/21. Neurology and PCCM is currently following. TRH assumed care on 10/05/21.    Today, pt appeared comfortable, easily arousable, but eye remains closed. Not following commands for me.   Assessment/Plan: Principal Problem:   Nontraumatic thalamic hemorrhage (HCC) Active Problems:   Respiratory failure with hypoxia (HCC)   Paroxysmal A-fib (HCC)   Obstructive sleep apnea   Type 2 diabetes mellitus ([place])   Hypertension associated with diabetes ([place])   Acute right thalamic ICH and IVH likely secondary to hypertension in the setting of coumadin use MRI showed stable right thalamic hemorrhage, stable small to moderate IVH with no ventriculomegaly. No increased intracranial mass effect. MRA showed abundant chronic micro-hemorrhages in the brain superimposed on chronic lacunar infarcts compatible with advanced small vessel disease CT head 1/27 - unchanged hematoma and ventricular size 2D Echo EF 60-65% LDL 52, HgbA1c 9.7 PT/OT- CIR   Cerebral Edema with left midline shift Stable  Continue Free water flushes   Acute hypoxemic respiratory failure in the setting of ICH History of tracheal stenosis Failure to wean off vent, s/p tracheostomy on 1/31 PEG placement on 10/02/2021 PCCM for trach  management   Paroxysmal Atrial Fibrillation Rate controlled Continue Flecainide, coreg No antithrombotic due to [place], hold Coumadin on hold   Persistent fever Unknown etiology, with leukocytosis UC, BC x2 NGTD Plan to repeat chest x-ray to rule out pneumonia, sputum culture pending Completed IV Rocephin x5 days on 10/05/2021 Monitor closely   Hypertension BP stable  Continue amlodipine, coreg, hydralazine   Hyperlipidemia LDL 52, goal < 70 Resume Lipitor once able   Diabetes type II Uncontrolled Last A1c 9.7 Continue SSI, Levemir, Accu-Cheks, hypoglycemic protocol Hold home metformin, Jardiance  Normocytic anemia Hemoglobin with a slight downward trend Monitor closely, daily CBC   Dysphagia  PEG 2/3 Continue TF and free water flushes  Obesity Lifestyle modification advised      Malnutrition Type:  Nutrition Problem: Inadequate oral intake Etiology: inability to eat   Malnutrition Characteristics:  Signs/Symptoms: NPO status   Nutrition Interventions:  Interventions: Tube feeding, Prostat    Estimated body mass index is 36.4 kg/m as calculated from the following:   Height as of this encounter: _0  (1.626 m).   Weight as of this encounter: 96.2 kg.     Code Status: Full  Family Communication: None at bedside  Disposition Plan: Status is: Inpatient Remains inpatient appropriate because: Level of care   Consultants: Neurology PCCM  Procedures: Mechanical ventilation Tracheostomy PEG tube placement  Antimicrobials: None  DVT prophylaxis: Lovenox   Objective: Vitals:   10/05/21 0745 10/05/21 0801 10/05/21 1138 10/05/21 1225  BP:  (!) 144/67    Pulse: 87 92 86   Resp: (!) 27 (!) 26 (!) 22   Temp:  99.6 F (37.6 C)  (!)  100.6 F (38.1 C)  TempSrc:  Axillary  Axillary  SpO2: 96% 94% 95%   Weight:      Height:        Intake/Output Summary (Last 24 hours) at 10/05/2021 1244 Last data filed at 10/05/2021 2536 Gross per 24 hour   Intake 730 ml  Output 1715 ml  Net -985 ml   Filed Weights   09/27/21 0500 09/30/21 0126 10/05/21 0500  Weight: 94.7 kg 92.6 kg 96.2 kg    Exam: General: NAD, trach collar noted, arousable, but does not open eyes Cardiovascular: S1, S2 present Respiratory: Coarse breath sounds bilaterally Abdomen: Soft, nontender, nondistended, bowel sounds present, PEG tube noted Musculoskeletal: No bilateral pedal edema noted Skin: Normal Psychiatry: Unable to assess Neurology: Unable to assess as patient not following commands for me    Data Reviewed: CBC: Recent Labs  Lab 10/01/21 0614 10/02/21 0409 10/03/21 0746 10/04/21 0232 10/05/21 0119  WBC 14.2* 15.3* 14.2* 15.4* 11.1*  NEUTROABS 11.4*  --   --   --   --   HGB 11.1* 11.4* 11.2* 10.9* 10.7*  HCT 32.7* 33.2* 34.3* 33.7* 31.8*  MCV 90.6 91.0 91.5 92.1 91.4  PLT 257 293 377 411* 644*   Basic Metabolic Panel: Recent Labs  Lab 09/29/21 0358 09/30/21 0707 10/01/21 0614 10/02/21 0409 10/03/21 0746 10/04/21 0232 10/05/21 0119  NA 140 141 140 143 141 140 139  K 3.9 3.6 3.5 4.0 4.3 3.9 3.7  CL 111 111 111 114* 109 107 106  CO2 20* 20* 20* 20* 21* 23 24  GLUCOSE 122* 132* 158* 78 208* 193* 137*  BUN 32* 36* 27* 22* 31* 36* 24*  CREATININE 0.99 1.09 0.95 0.87 0.94 1.01 0.92  CALCIUM 8.9 8.9 8.9 9.0 9.3 8.9 8.8*  MG 2.6* 2.6*  --   --   --   --   --   PHOS 3.3  --   --   --   --   --   --    GFR: Estimated Creatinine Clearance: 90.5 mL/min (by C-G formula based on SCr of 0.92 mg/dL). Liver Function Tests: Recent Labs  Lab 09/29/21 0358  ALBUMIN 2.6*   No results for input(s): LIPASE, AMYLASE in the last 168 hours. No results for input(s): AMMONIA in the last 168 hours. Coagulation Profile: Recent Labs  Lab 09/29/21 0915  INR 0.9   Cardiac Enzymes: No results for input(s): CKTOTAL, CKMB, CKMBINDEX, TROPONINI in the last 168 hours. BNP (last 3 results) No results for input(s): PROBNP in the last 8760  hours. HbA1C: No results for input(s): HGBA1C in the last 72 hours. CBG: Recent Labs  Lab 10/04/21 2027 10/05/21 0004 10/05/21 0415 10/05/21 0806 10/05/21 1226  GLUCAP 146* 154* 120* 150* 175*   Lipid Profile: No results for input(s): CHOL, HDL, LDLCALC, TRIG, CHOLHDL, LDLDIRECT in the last 72 hours. Thyroid Function Tests: No results for input(s): TSH, T4TOTAL, FREET4, T3FREE, THYROIDAB in the last 72 hours. Anemia Panel: No results for input(s): VITAMINB12, FOLATE, FERRITIN, TIBC, IRON, RETICCTPCT in the last 72 hours. Urine analysis:    Component Value Date/Time   COLORURINE YELLOW 09/30/2021 1845   APPEARANCEUR CLEAR 09/30/2021 1845   LABSPEC 1.025 09/30/2021 1845   PHURINE 5.5 09/30/2021 1845   GLUCOSEU NEGATIVE 09/30/2021 1845   HGBUR TRACE (A) 09/30/2021 1845   BILIRUBINUR NEGATIVE 09/30/2021 1845   KETONESUR NEGATIVE 09/30/2021 1845   PROTEINUR 100 (A) 09/30/2021 1845   NITRITE NEGATIVE 09/30/2021 1845   LEUKOCYTESUR SMALL (A)  09/30/2021 1845   Sepsis Labs: _0 (procalcitonin:4,lacticidven:4)  ) Recent Results (from the past 240 hour(s))  Culture, blood (Routine X 2) w Reflex to ID Panel     Status: None   Collection Time: 09/25/21  5:05 PM   Specimen: BLOOD  Result Value Ref Range Status   Specimen Description BLOOD LEFT ANTECUBITAL  Final   Special Requests   Final    BOTTLES DRAWN AEROBIC AND ANAEROBIC Blood Culture adequate volume   Culture   Final    NO GROWTH 5 DAYS Performed at [facility], [address], [place], [place] [address]    Report Status 09/30/2021 FINAL  Final  Culture, blood (Routine X 2) w Reflex to ID Panel     Status: None   Collection Time: 09/25/21  5:10 PM   Specimen: BLOOD LEFT WRIST  Result Value Ref Range Status   Specimen Description BLOOD LEFT WRIST  Final   Special Requests   Final    BOTTLES DRAWN AEROBIC AND ANAEROBIC Blood Culture adequate volume   Culture   Final    NO GROWTH 5 DAYS Performed at  [facility], [place] [address] [place], [place] [address]    Report Status 09/30/2021 FINAL  Final  Urine Culture     Status: None   Collection Time: 09/30/21  6:25 PM   Specimen: Urine, Catheterized  Result Value Ref Range Status   Specimen Description URINE, CATHETERIZED  Final   Special Requests NONE  Final   Culture   Final    NO GROWTH Performed at [facility], [place] [address] [place], [place] [address]    Report Status 10/02/2021 FINAL  Final      Studies: No results found.  Scheduled Meds:  amLODipine  10 mg Per Tube Daily   bethanechol  25 mg Per Tube TID   carvedilol  25 mg Per Tube BID WC   chlorhexidine gluconate (MEDLINE KIT)  15 mL Mouth Rinse BID   Chlorhexidine Gluconate Cloth  6 each Topical Q0600   feeding supplement (PROSource TF)  90 mL Per Tube BID   fiber  1 packet Per Tube TID   flecainide  150 mg Per Tube BID   free water  200 mL Per Tube Q3H   Gerhardt's butt cream   Topical BID   heparin injection (subcutaneous)  5,000 Units Subcutaneous Q8H   hydrALAZINE  50 mg Per Tube Q8H   insulin aspart  0-20 Units Subcutaneous Q4H   insulin aspart  5 Units Subcutaneous Q4H   insulin detemir  27 Units Subcutaneous BID   mouth rinse  15 mL Mouth Rinse 10 times per day   oxyCODONE  5 mg Per Tube TID   pantoprazole sodium  40 mg Per Tube QHS    Continuous Infusions:  feeding supplement (JEVITY 1.5 CAL/FIBER) 1,000 mL (10/02/21 1730)     LOS: 15 days     Alma Friendly, MD [facility]  If 7PM-7AM, please contact night-coverage [email or web address] 10/05/2021, 12:44 PM

## 2021-10-05 NOTE — Progress Notes (Signed)
STROKE TEAM PROGRESS NOTE   INTERVAL HISTORY RN at bedside, OT also is working with him.  Patient still very drowsy sleepy, hardly opens eyes on voice, however still able to follow commands and right hand and foot with delay.  No significant change from yesterday.  Low-grade fever with Tmax 100.6, BP stable, leukocytosis also improved.  PT/OT recommends CIR.  Vitals:   10/05/21 1225 10/05/21 1300 10/05/21 1521 10/05/21 1535  BP:    (!) 145/68  Pulse:   82 83  Resp:   (!) 30 17  Temp: (!) 100.6 F (38.1 C) 98.5 F (36.9 C)  99.1 F (37.3 C)  TempSrc: Axillary   Axillary  SpO2:   95% 93%  Weight:      Height:       CBC:  Recent Labs  Lab 10/01/21 0614 10/02/21 0409 10/04/21 0232 10/05/21 0119  WBC 14.2*   < > 15.4* 11.1*  NEUTROABS 11.4*  --   --   --   HGB 11.1*   < > 10.9* 10.7*  HCT 32.7*   < > 33.7* 31.8*  MCV 90.6   < > 92.1 91.4  PLT 257   < > 411* 416*   < > = values in this interval not displayed.   Basic Metabolic Panel:  Recent Labs  Lab 09/29/21 0358 09/30/21 0707 10/01/21 0614 10/04/21 0232 10/05/21 0119  NA 140 141   < > 140 139  K 3.9 3.6   < > 3.9 3.7  CL 111 111   < > 107 106  CO2 20* 20*   < > 23 24  GLUCOSE 122* 132*   < > 193* 137*  BUN 32* 36*   < > 36* 24*  CREATININE 0.99 1.09   < > 1.01 0.92  CALCIUM 8.9 8.9   < > 8.9 8.8*  MG 2.6* 2.6*  --   --   --   PHOS 3.3  --   --   --   --    < > = values in this interval not displayed.   Lipid Panel:  No results for input(s): CHOL, TRIG, HDL, CHOLHDL, VLDL, LDLCALC in the last 168 hours.  HgbA1c: No results for input(s): HGBA1C in the last 168 hours.  Urine Drug Screen:  No results for input(s): LABOPIA, COCAINSCRNUR, LABBENZ, AMPHETMU, THCU, LABBARB in the last 168 hours.   Alcohol Level No results for input(s): ETH in the last 168 hours.  IMAGING past 24 hours No results found.  PHYSICAL EXAM  Physical Exam  Constitutional: Appears well-developed and well-nourished.  Middle-aged  Caucasian male not in distress.  Status post tracheostomy Cardiovascular: Normal rate and regular rhythm.  Respiratory: Respirations even and unlabored on trach collar  Neuro: Eyes closed, hardly open on voice.  Will follow commands with right hand and foot, no movement of LUE and LLE with hypotonia and dense left hemiplegia.   ASSESSMENT/PLAN Mr. Moosa Klima is a 61 y.o. male with history of AFib on Coumadin s/p recent conversion, HTN, HLD, remote tobacco abuse, Cataract OU, glaucoma, obesity, CVA, DM II, and OSA who initially presented to the ED via EMS left sided weakness, dysarthria, and right fixed gaze. Coumadin reversed. Labetalol and cleviprex used for BP greater than 140 during CT. Patient was then intubated due to somnolence and inability to protect his airway. CT and MRI show stable right thalamic hemorrhage with intraventricular extension. ICH score 2. Febrile. Hypertonic saline discontinued 1/23. Most recent sodium is 155.  Currently in NSR with flecainide continued. Holding home antihypertensive medications currently. No longer using cleviprex or norepinephrine (r/t hypotension after RSI) for BP control. Repeat CT shows no significant change in the hemorrhage or ventricle size. Weaning 1/28 and 1/29 at 30% , 5/5. Extubated 1/29 AM and reintubated 1/29 PM due to copious secretions.  Tracheostomy was placed 1/31. PEG tube placed 2/3. He remains on trach collar, plan to move out of ICU today and then to acute inpatient rehab.   Stroke:  Acute right thalamic ICH and IVH likely secondary to hypertension in the setting of coumadin use Code stroke CT - Positive for acute right thalamic hemorrhage with intraventricular extension. Estimated intra-axial blood volume 25 mL. Mild Regional brain edema. Trace leftward midline shift. 1/22 Repeat CT shows no changes from code stroke CT MRI  Stable right thalamic hemorrhage since presentation. Regional edema, tracking into the midbrain. Stable small  to moderate IVH with no ventriculomegaly. No increased intracranial mass effect. MRA  Abundant chronic micro-hemorrhages in the brain superimposed on chronic lacunar infarcts compatible with advanced small vessel disease CT head 1/27 - unchanged hematoma and ventricular size Carotid Doppler  1-39% stenosis in Left and Right ICA 2D Echo EF 60-65% LDL 52 HgbA1c 9.7 VTE prophylaxis - heparin warfarin daily prior to admission, now on No antithrombotic due to [place] Therapy recommendations:  CIR Disposition:  pending  Cerebral Edema with left midline shift Stable MRI and repeat CT scan Hypertonic saline discontinued d/c Na serial checks Allow Na trending down gradually  Free water flushes given 200cc->400cc q4h->200 Q3h  Acute hypoxemic respiratory failure CCM on board Difficult intubation r/t tracheal stenosis Extubated 1/29 and reintubated Tracheostomy 1/31 PEG 2/3  Atrial Fibrillation Home meds: Flecainide, coreg, coumadin Rate controlled now on No antithrombotic due to [place] Per Dr. Leonie Man, pt is ASPIRE candidate. Pending consent once stable  Fever, improved Blood culture 1/2 staph epidermidis Repeat blood culture no growth Tmax 102.4->100.7 -> 100.4->100.6 WBC 15.3->14.2 -> 15.4->11.1 completed rocephin course  Hypertension Home meds:  Amlodipine, coreg, losartan-hydrochlorothiazide BP goal less than 160 On coreg 25 bid, hydralazine 50 Q8h, norvasc 10 Long term BP goal normotensive  Hyperlipidemia Home meds: Lipitor 80 LDL 52, goal < 70 Consider to resume Lipitor on discharge  Diabetes type II Uncontrolled Home meds:  Metformin, jardiance HgbA1c 9.7 , goal < 7.0 CBGs SSI On levemir Close PCP follow-up as outpatient for better DM control  Dysphagia  PEG 2/3 On TF @ 50 and FW  Other Stroke Risk Factors Former smoker - quit 20 years ago Obesity, Body mass index is 36.4 kg/m., BMI >/= 30 associated with increased stroke risk, recommend weight loss, diet and  exercise as appropriate  Obstructive sleep apnea  Other Active Problems   Hospital day # 15   Rosalin Hawking, MD PhD Stroke Neurology 10/05/2021 3:56 PM     To contact Stroke Continuity provider, please refer to [email or web address] After hours, contact General Neurology

## 2021-10-05 NOTE — Progress Notes (Signed)
Physical Therapy Treatment Patient Details Name: Caleb Vaughan MRN: [id number] DOB: [birth year] Today's Date: 10/05/2021   History of Present Illness Patient is a 61 y/o male admitted due to R thalamic hemorrhage, edema and trace leftward midline shift, intubated on admission, attempted extubation 1/29, re-intubated that evening.  Trach 1/31. Pt is s/p peg tube on 2/3. PMH positive for a-fib on coumadin, HTN, HLD, previous smoker, prior CVA, DM, OSA.    PT Comments    Pt progressing towards goals. Required max to total A +2 for bed mobility tasks this session. Worked on upright posture facilitation through shoulder retraction and assisted neck extension.  Required mod to max A for sitting balance and requiring increased assist with increased fatigue. Current recommendations for acute inpatient rehab appropriate. Will continue to follow acutely.    Recommendations for follow up therapy are one component of a multi-disciplinary discharge planning process, led by the attending physician.  Recommendations may be updated based on patient status, additional functional criteria and insurance authorization.  Follow Up Recommendations  Acute inpatient rehab (3hours/day)     Assistance Recommended at Discharge Frequent or constant Supervision/Assistance  Patient can return home with the following Two people to help with walking and/or transfers;Two people to help with bathing/dressing/bathroom   Equipment Recommendations  Other (comment) (TBD)    Recommendations for Other Services Rehab consult     Precautions / Restrictions Precautions Precautions: Fall Precaution Comments: trach, peg tube, rectal tube Restrictions Weight Bearing Restrictions: No     Mobility  Bed Mobility Overal bed mobility: Needs Assistance Bed Mobility: Supine to Sit     Supine to sit: HOB elevated, Max assist, +2 for physical assistance Sit to supine: Total assist, +2 for physical assistance   General bed  mobility comments: Pt able to use RUE to assist some with coming to sitting. Worked on upright postural facilitation and required assist for shoulder retraction and neck extension. Kept eyes closed, but was able to perform some commands to lift RUE in sitting X5.    Transfers                        Ambulation/Gait                   Stairs             Wheelchair Mobility    Modified Rankin (Stroke Patients Only)       Balance Overall balance assessment: Needs assistance Sitting-balance support: Feet supported, Single extremity supported Sitting balance-Leahy Scale: Poor Sitting balance - Comments: mod to max A for balance increased with fatigue                                    Cognition Arousal/Alertness: Lethargic Behavior During Therapy: Flat affect Overall Cognitive Status: Difficult to assess                                 General Comments: Following simple commands for RUE/RLE movement with occasionally eye opening.        Exercises General Exercises - Upper Extremity Shoulder Flexion: AAROM, Right, 5 reps    General Comments General comments (skin integrity, edema, etc.): VSS on 8L 35% FiO2      Pertinent Vitals/Pain Pain Assessment Pain Assessment: Faces Faces Pain Scale: No hurt    Home Living  Family/patient expects to be discharged to:: Skilled nursing facility Living Arrangements: Children                      Prior Function            PT Goals (current goals can now be found in the care plan section) Acute Rehab PT Goals Patient Stated Goal: mother hopeful for rehab PT Goal Formulation: With family Time For Goal Achievement: 10/12/21 Potential to Achieve Goals: Fair Progress towards PT goals: Progressing toward goals    Frequency    Min 4X/week      PT Plan Current plan remains appropriate    Co-evaluation              AM-PAC PT "6 Clicks" Mobility   Outcome  Measure  Help needed turning from your back to your side while in a flat bed without using bedrails?: Total Help needed moving from lying on your back to sitting on the side of a flat bed without using bedrails?: Total Help needed moving to and from a bed to a chair (including a wheelchair)?: Total Help needed standing up from a chair using your arms (e.g., wheelchair or bedside chair)?: Total Help needed to walk in hospital room?: Total Help needed climbing 3-5 steps with a railing? : Total 6 Click Score: 6    End of Session Equipment Utilized During Treatment: Oxygen Activity Tolerance: Patient limited by fatigue Patient left: in bed;with call bell/phone within reach;with nursing/sitter in room;with bed alarm set;with restraints reapplied Nurse Communication: Mobility status PT Visit Diagnosis: Hemiplegia and hemiparesis;Other abnormalities of gait and mobility (R26.89);Muscle weakness (generalized) (M62.81) Hemiplegia - Right/Left: Left Hemiplegia - dominant/non-dominant: Non-dominant Hemiplegia - caused by: Nontraumatic intracerebral hemorrhage     Time: 0920-0945 PT Time Calculation (min) (ACUTE ONLY): 25 min  Charges:  $Therapeutic Activity: 23-37 mins                     Caleb Vaughan, DPT  Acute Rehabilitation Services  Pager: [phone] Office: [phone]    Caleb Vaughan 10/05/2021, 11:51 AM

## 2021-10-05 NOTE — Progress Notes (Signed)
Speech Language Pathology Treatment: Hillary Bow Speaking valve  Patient Details Name: Caleb Vaughan MRN: [id number] DOB: 10-Jan-1961 Today's Date: 10/05/2021 Time: 7001-7494 SLP Time Calculation (min) (ACUTE ONLY): 13 min  Assessment / Plan / Recommendation Clinical Impression  Pt was seen at bedside for PMSV tolerance and treatment. Pt was lethargic throughout the duration of the session, but demonstrated understanding of simple one-step commands (I.e., "squeeze my hand 2 times" and wave bye). Pt instructed to open eyes in order to visualize PMSV; required max assist to engage in SLP education about speaking valve and redirecting airflow to upper respiratory structures. SLP used verbal prompts and cues and tactile sensation with wet cloth to sustain attention. Cuff was deflated upon arrival in pt.'s room.  He presented with RR 25, SpO2 95, and HR 90 at baseline. Upon PMSV placement, reported vitals include RR 27, SpO2 95, and HR 96. Pt exhibited coughing and expectorated bloody secretions upon PMSV placement 5x. Pt unable to tolerate PMSV placement d/t increased amounts of secretions and immediate coughing upon placement. SLP noted that pt is able to redirect airflow to upper respiratory structures as evidenced by breathing orally and having secretions at the oral and tracheal levels. Pt did not require deep suction from nursing d/t successful expectorations of secretions. Despite cues to verbalize, no attempts were made by the pt to verbalize. PMSV removed at the end of the session due to pt.'s increasing RR and coughing; noted RR at 31. Pt's intolerance for valve is primary reason for SLP removing. PMSV may be used with SLP only at this time and SLP will follow pt during acute stay.   HPI HPI: Pt is a 61 y/o male who presented with left sided weakness, dysarthria, and right fixed gaze. Imaging revealed R thalamic hemorrhage, edema and trace leftward midline shift. Pt intubated in ED for increasing  somnolence and concerns regarding airway protection. ETT 1/22-1/29 failed extubation due to secretions; reintubated 1/29-trach 1/31. PMH: A-fib on coumadin, HTN, HLD, previous smoker, prior CVA, DM, OSA.      SLP Plan  Continue with current plan of care      Recommendations for follow up therapy are one component of a multi-disciplinary discharge planning process, led by the attending physician.  Recommendations may be updated based on patient status, additional functional criteria and insurance authorization.    Recommendations         Patient may use Passy-Muir Speech Valve: with SLP only PMSV Supervision: Full MD: Please consider changing trach tube to : Smaller size         Oral Care Recommendations: Oral care BID Follow Up Recommendations: Acute inpatient rehab (3hours/day) Assistance recommended at discharge: Frequent or constant Supervision/Assistance SLP Visit Diagnosis: Aphonia (R49.1) Plan: Continue with current plan of care           [facility]  10/05/2021, 8:59 AM

## 2021-10-05 NOTE — Progress Notes (Signed)
NAME:  Caleb Vaughan, MRN:  [id number], DOB:  1960-09-11, LOS: 15 ADMISSION DATE:  09/20/2021, CONSULTATION DATE:  09/20/2021 REFERRING MD:  Dr. Oretha Milch, CHIEF COMPLAINT:  Hemorrhagic stroke    History of Present Illness:  Caleb Vaughan is a 61 y.o. male with PMH listed below  who presented to the emergency department 1/22 as a code stroke, last known normal 2 AM.   Stroke CT on admit revealed, positive for acute right thalamic hemorrhage with intraventricular extension with estimated intra-axial blood volume 25 mL. Mild Regional brain edema. Trace leftward midline shift. Patient was intubated in ED and PCCM was consulted for further assessment.   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 Intubated in ED for increasing somnolence and inability to protect airway.  1/24 increased respiratory distress requiring increased sedation and intermittent neuromuscular blockade.  Improved with diuresis.  3% saline stopped for increasing sodium above 159 1/29 Continue to tolerated wean but gets agitated with decreased sedation, failed extubation 2/2 secretions, foley placed for urinary retention, tmax 102 1/30 PICC removed, increased bethanechol, weaned x 12 hrs  1/31 trach  2/1 trach collar for a few hours, back on vent after PT. Started rocephin for fever + WBC 14 (?UTI)  2/6 Followed up in floor for trach management   Images  1/23 MRI >  Stable right thalamic hemorrhage since presentation. Regional edema, tracking into the midbrain. 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 No increased intracranial mass effect. 1/23 MRA>  Abundant chronic micro-hemorrhages in the brain  superimposed on chronic lacunar infarcts compatible with advanced small vessel disease 1/23 TTE> LVEF 60-65, no wall abnormalities, normal RV 1/27 CT head: No progression r thalamocapsular hematoma with intraventricular extension. No hydrocephalus, or mass effect   Interim History / Subjective:  No acute issues since last PCCM follow up  Remains on ATC   Objective   Blood pressure (!) 124/57, pulse 87, temperature 98.9 F (37.2 C), temperature source Oral, resp. rate (!) 27, height 5\' 4"  (1.626 m), weight 96.2 kg, SpO2 96 %.    FiO2 (%):  [35 %-60 %] 35 %   Intake/Output Summary (Last 24 hours) at 10/05/2021 0750 Last data filed at 10/05/2021 12/03/2021 Gross per 24 hour  Intake 1390 ml  Output 2415 ml  Net -1025 ml    Filed Weights   09/27/21 0500 09/30/21 0126 10/05/21 0500  Weight: 94.7 kg 92.6 kg 96.2 kg   Examination: General: Acute on chronically ill appearing middle aged male lying in bed on ATC, in NAD HEENT: 6 cuffed trach midline, MM pink/moist, PERRL,  Neuro: Unresponsive  CV: s1s2 regular rate and rhythm, no murmur, rubs, or gallops,  PULM:  Slight rhonchi bilaterally, no increased work of breathing, on 6L oxygen through ATC GI: soft, bowel sounds active in all 4 quadrants, non-tender, non-distended, tolerating TF Extremities: warm/dry, no edema  Skin: no rashes or lesions  Assessment & Plan:   Acute respiratory failure with hypoxia requiring mechanical ventilation in setting of nontraumatic R thalamic hemorrhage and cerebral edema  S/p Tracheostomy (1/31)  Suspected aspiration event (1/29) Hx OSA Hx Tracheal stenosis  -failed extubation 1/29 due to poor secretion management, possible aspirated at this  time -on continuous trach collar starting 2/2  P: Will need to downsize trach to cuffless soon, would Continue routine trach care  Repeat CXR as needed  Mobilize as able   Best Practice (right click and "Reselect all SmartList Selections" daily)  Per primary    Critical care: NA   Kerry-Anne Mezo D. Tiburcio Pea, NP-C [facility] Personal contact information can be found on Amion  10/05/2021, 7:53 AM

## 2021-10-06 ENCOUNTER — Inpatient Hospital Stay (HOSPITAL_COMMUNITY): Payer: No Typology Code available for payment source

## 2021-10-06 DIAGNOSIS — I4821 Permanent atrial fibrillation: Secondary | ICD-10-CM | POA: Diagnosis not present

## 2021-10-06 DIAGNOSIS — I619 Nontraumatic intracerebral hemorrhage, unspecified: Secondary | ICD-10-CM | POA: Diagnosis not present

## 2021-10-06 DIAGNOSIS — J9601 Acute respiratory failure with hypoxia: Secondary | ICD-10-CM | POA: Diagnosis not present

## 2021-10-06 DIAGNOSIS — E1159 Type 2 diabetes mellitus with other circulatory complications: Secondary | ICD-10-CM | POA: Diagnosis not present

## 2021-10-06 LAB — CBC WITH DIFFERENTIAL/PLATELET
Abs Immature Granulocytes: 0.2 10*3/uL — ABNORMAL HIGH (ref 0.00–0.07)
Basophils Absolute: 0 10*3/uL (ref 0.0–0.1)
Basophils Relative: 0 %
Eosinophils Absolute: 0.5 10*3/uL (ref 0.0–0.5)
Eosinophils Relative: 4 %
HCT: 33.5 % — ABNORMAL LOW (ref 39.0–52.0)
Hemoglobin: 11.1 g/dL — ABNORMAL LOW (ref 13.0–17.0)
Immature Granulocytes: 2 %
Lymphocytes Relative: 12 %
Lymphs Abs: 1.4 10*3/uL (ref 0.7–4.0)
MCH: 30.4 pg (ref 26.0–34.0)
MCHC: 33.1 g/dL (ref 30.0–36.0)
MCV: 91.8 fL (ref 80.0–100.0)
Monocytes Absolute: 1.4 10*3/uL — ABNORMAL HIGH (ref 0.1–1.0)
Monocytes Relative: 12 %
Neutro Abs: 8.3 10*3/uL — ABNORMAL HIGH (ref 1.7–7.7)
Neutrophils Relative %: 70 %
Platelets: 421 10*3/uL — ABNORMAL HIGH (ref 150–400)
RBC: 3.65 MIL/uL — ABNORMAL LOW (ref 4.22–5.81)
RDW: 12.7 % (ref 11.5–15.5)
WBC: 11.9 10*3/uL — ABNORMAL HIGH (ref 4.0–10.5)
nRBC: 0 % (ref 0.0–0.2)

## 2021-10-06 LAB — GLUCOSE, CAPILLARY
Glucose-Capillary: 108 mg/dL — ABNORMAL HIGH (ref 70–99)
Glucose-Capillary: 117 mg/dL — ABNORMAL HIGH (ref 70–99)
Glucose-Capillary: 129 mg/dL — ABNORMAL HIGH (ref 70–99)
Glucose-Capillary: 137 mg/dL — ABNORMAL HIGH (ref 70–99)
Glucose-Capillary: 151 mg/dL — ABNORMAL HIGH (ref 70–99)
Glucose-Capillary: 163 mg/dL — ABNORMAL HIGH (ref 70–99)

## 2021-10-06 LAB — BASIC METABOLIC PANEL
Anion gap: 8 (ref 5–15)
BUN: 23 mg/dL — ABNORMAL HIGH (ref 6–20)
CO2: 26 mmol/L (ref 22–32)
Calcium: 8.9 mg/dL (ref 8.9–10.3)
Chloride: 105 mmol/L (ref 98–111)
Creatinine, Ser: 0.81 mg/dL (ref 0.61–1.24)
GFR, Estimated: >60 mL/min (ref >60)
Glucose, Bld: 99 mg/dL (ref 70–99)
Potassium: 3.8 mmol/L (ref 3.5–5.1)
Sodium: 139 mmol/L (ref 135–145)

## 2021-10-06 LAB — PROCALCITONIN: Procalcitonin: <0.1 ng/mL

## 2021-10-06 NOTE — Progress Notes (Signed)
Physical Therapy Treatment Patient Details Name: Caleb Vaughan MRN: [id number] DOB: 11-04-1960 Today's Date: 10/06/2021   History of Present Illness Patient is a 61 y/o male admitted due to R thalamic hemorrhage, edema and trace leftward midline shift, intubated on admission, attempted extubation 1/29, re-intubated that evening.  Trach 1/31. Pt is s/p peg tube on 2/3. PMH positive for a-fib on coumadin, HTN, HLD, previous smoker, prior CVA, DM, OSA.    PT Comments    Pt with eyes closed upon arrival to room, but restless and following commands with RUE giving thumbs up/down to answer PT questions. Pt with eyes open sitting EOB, tolerated 10+ minutes EOB seated balance intervention. Pt exhibiting some L pushing behaviors today, difficult to correct when pushing in full extension with RUE on bed. Pt constantly pulling at lines/leads/trach EOB, requiring frequent multimodal redirection. No active LUE/LE observed or engaged during session, even with several attempts. Overall pt requiring max-total +2 for to/from EOB. Pt with loose stool at end of session, continuing to use the bathroom at PT exit so RN staff notified for need for clean up. PT to continue to follow.     Recommendations for follow up therapy are one component of a multi-disciplinary discharge planning process, led by the attending physician.  Recommendations may be updated based on patient status, additional functional criteria and insurance authorization.  Follow Up Recommendations  Acute inpatient rehab (3hours/day)     Assistance Recommended at Discharge Frequent or constant Supervision/Assistance  Patient can return home with the following Two people to help with walking and/or transfers;Two people to help with bathing/dressing/bathroom   Equipment Recommendations  Other (comment) (tbd)    Recommendations for Other Services       Precautions / Restrictions Precautions Precautions: Fall Precaution Comments: trach 10L/60%  FiO2, peg tube (paused during session to prevent aspiration) Restrictions Weight Bearing Restrictions: No     Mobility  Bed Mobility Overal bed mobility: Needs Assistance Bed Mobility: Supine to Sit, Sit to Supine     Supine to sit: Total assist, +2 for physical assistance Sit to supine: Total assist, +2 for physical assistance   General bed mobility comments: total +2 for all aspects, helicopter method with assist of bed pad and SPT on LEs, PT on trunk support. Eye opening once EOB.    Transfers                   General transfer comment: NT    Ambulation/Gait                   Stairs             Wheelchair Mobility    Modified Rankin (Stroke Patients Only) Modified Rankin (Stroke Patients Only) Modified Rankin: Severe disability     Balance Overall balance assessment: Needs assistance Sitting-balance support: Feet supported, Single extremity supported Sitting balance-Leahy Scale: Poor Sitting balance - Comments: mod truncal support posteriorly, heavy L lateral leaning exhibiting some pushing behaviors towards L with RUE. EOB sitting x12 minutes Postural control: Left lateral lean, Posterior lean     Standing balance comment: nt                            Cognition Arousal/Alertness: Lethargic Behavior During Therapy: Flat affect Overall Cognitive Status: Difficult to assess Area of Impairment: Following commands, Safety/judgement, Attention, Problem solving  Current Attention Level: Focused   Following Commands: Follows one step commands with increased time Safety/Judgement: Decreased awareness of safety, Decreased awareness of deficits Awareness: Intellectual Problem Solving: Requires verbal cues, Requires tactile cues, Difficulty sequencing General Comments: Pt requires max redirection to avoid pulling trach and lines/leads with RUE during session. consistently follows commands ~75% of the time,  consistently answers PT yes/no questions with thumbs up/down        Exercises General Exercises - Lower Extremity Long Arc Quad: AAROM, Both, 10 reps, Seated Other Exercises Other Exercises: sitting balance intervention: upright posture with posterior truncal support, L and R lateral propping especially the latter to address L pushing behaviors, LE exercises to challenge balance    General Comments        Pertinent Vitals/Pain Pain Assessment Pain Assessment: Faces Faces Pain Scale: Hurts a little bit Pain Location: generalized discomfort with LE ROM L>R Pain Descriptors / Indicators: Discomfort, Grimacing, Guarding Pain Intervention(s): Limited activity within patient's tolerance, Monitored during session, Repositioned    Home Living                          Prior Function            PT Goals (current goals can now be found in the care plan section) Acute Rehab PT Goals Patient Stated Goal: mother hopeful for rehab PT Goal Formulation: With family Time For Goal Achievement: 10/12/21 Potential to Achieve Goals: Fair Progress towards PT goals: Progressing toward goals    Frequency    Min 4X/week      PT Plan Current plan remains appropriate    Co-evaluation              AM-PAC PT "6 Clicks" Mobility   Outcome Measure  Help needed turning from your back to your side while in a flat bed without using bedrails?: A Lot Help needed moving from lying on your back to sitting on the side of a flat bed without using bedrails?: Total Help needed moving to and from a bed to a chair (including a wheelchair)?: Total Help needed standing up from a chair using your arms (e.g., wheelchair or bedside chair)?: Total Help needed to walk in hospital room?: Total Help needed climbing 3-5 steps with a railing? : Total 6 Click Score: 7    End of Session Equipment Utilized During Treatment: Oxygen (via trach) Activity Tolerance: Patient limited by fatigue Patient  left: in bed;with call bell/phone within reach;with bed alarm set;with restraints reapplied (R handmitt, R wrist restraint) Nurse Communication: Mobility status;Other (comment) (actively having BM, will need assist cleaning up) PT Visit Diagnosis: Hemiplegia and hemiparesis;Other abnormalities of gait and mobility (R26.89);Muscle weakness (generalized) (M62.81) Hemiplegia - Right/Left: Left Hemiplegia - dominant/non-dominant: Non-dominant Hemiplegia - caused by: Nontraumatic intracerebral hemorrhage     Time: 1201-1233 PT Time Calculation (min) (ACUTE ONLY): 32 min  Charges:  $Therapeutic Activity: 8-22 mins $Neuromuscular Re-education: 8-22 mins                     Stacie Glaze, PT DPT Acute Rehabilitation Services Pager [phone]  Office [phone]    Roxine Caddy E Ruffin Pyo 10/06/2021, 1:29 PM

## 2021-10-06 NOTE — Progress Notes (Signed)
Trach sutures removed per MD order. New trach ties secured.

## 2021-10-06 NOTE — Progress Notes (Signed)
PROGRESS NOTE  Caleb Vaughan XQH:[id number] DOB: [birth year] DOA: 09/20/2021 PCP: Pcp, No  HPI/Recap of past 24 hours: [name] is a 61 y.o. male with PMH of Afib on coumadin, HTN, HLD, DM, OSA, prior CVA presented to the ED on 1/22 as a code stroke. Pt presented with left sided weakness, dysarthria, and right fixed gaze. CT on admit revealed, positive for acute right thalamic hemorrhage with intraventricular extension with mild regional brain edema, trace leftward midline shift. Neurology consulted. Patient was intubated in ED and PCCM was consulted for vent management. Pt was admitted in neuro ICU. Due to inability to wean off MVT, pt was trached on 09/29/21 and a PEG tube was placed on 10/02/21. Neurology and PCCM is currently following. TRH assumed care on 10/05/21.    Today, patient not arousable to voice, little arousable to sternal rub.  Looked comfortable.   Assessment/Plan: Principal Problem:   Nontraumatic thalamic hemorrhage (HCC) Active Problems:   Respiratory failure with hypoxia (HCC)   Paroxysmal A-fib (HCC)   Obstructive sleep apnea   Type 2 diabetes mellitus ([place])   Hypertension associated with diabetes ([place])   Acute right thalamic ICH and IVH likely secondary to hypertension in the setting of coumadin use MRI showed stable right thalamic hemorrhage, stable small to moderate IVH with no ventriculomegaly. No increased intracranial mass effect. MRA showed abundant chronic micro-hemorrhages in the brain superimposed on chronic lacunar infarcts compatible with advanced small vessel disease CT head 1/27 - unchanged hematoma and ventricular size 2D Echo EF 60-65% LDL 52, HgbA1c 9.7 PT/OT- CIR   Cerebral Edema with left midline shift Stable  Continue Free water flushes   Acute hypoxemic respiratory failure in the setting of ICH History of tracheal stenosis Failure to wean off vent, s/p tracheostomy on 1/31 PEG placement on 10/02/2021 PCCM for trach management    Paroxysmal Atrial Fibrillation Rate controlled Continue Flecainide, coreg No antithrombotic due to [place], hold Coumadin on hold   Persistent fever Unknown etiology, with leukocytosis Last temp spike 100.6 F on 10/05/2021 UC, BC x2 NGTD Procalcitonin negative Plan to repeat chest x-ray to rule out pneumonia, sputum culture pending Completed IV Rocephin x5 days on 10/05/2021 Monitor closely   Hypertension BP stable  Continue amlodipine, coreg, hydralazine   Hyperlipidemia LDL 52, goal < 70 Resume Lipitor once able   Diabetes type II Uncontrolled Last A1c 9.7 Continue SSI, Levemir, Accu-Cheks, hypoglycemic protocol Hold home metformin, Jardiance  Normocytic anemia Hemoglobin with a slight downward trend Monitor closely, daily CBC   Dysphagia  PEG 2/3 Continue TF and free water flushes  Obesity Lifestyle modification       Malnutrition Type:  Nutrition Problem: Inadequate oral intake Etiology: inability to eat   Malnutrition Characteristics:  Signs/Symptoms: NPO status   Nutrition Interventions:  Interventions: Tube feeding, Prostat    Estimated body mass index is 35.68 kg/m as calculated from the following:   Height as of this encounter: 5' 4" (1.626 m).   Weight as of this encounter: 94.3 kg.     Code Status: Full  Family Communication: None at bedside  Disposition Plan: Status is: Inpatient Remains inpatient appropriate because: Level of care   Consultants: Neurology PCCM  Procedures: Mechanical ventilation Tracheostomy PEG tube placement  Antimicrobials: None  DVT prophylaxis: Lovenox   Objective: Vitals:   10/06/21 1157 10/06/21 1448 10/06/21 1523 10/06/21 1537  BP: (!) 148/68  (!) 148/68   Pulse: 82 83 84 89  Resp: (!) 28 (!) 24 19 16  Temp: 99.2 F (37.3 C)  98.5 F (36.9 C)   TempSrc: Axillary  Axillary   SpO2: 96% 91% 95% 94%  Weight:      Height:        Intake/Output Summary (Last 24 hours) at 10/06/2021 1831 Last  data filed at 10/06/2021 1826 Gross per 24 hour  Intake --  Output 1600 ml  Net -1600 ml   Filed Weights   09/30/21 0126 10/05/21 0500 10/06/21 0500  Weight: 92.6 kg 96.2 kg 94.3 kg    Exam: General: NAD, trach collar noted, arousable, but does not open eyes Cardiovascular: S1, S2 present Respiratory: Coarse breath sounds bilaterally Abdomen: Soft, nontender, nondistended, bowel sounds present, PEG tube noted Musculoskeletal: No bilateral pedal edema noted Skin: Normal Psychiatry: Unable to assess Neurology: Unable to assess as patient not following commands    Data Reviewed: CBC: Recent Labs  Lab 10/01/21 0614 10/02/21 0409 10/03/21 0746 10/04/21 0232 10/05/21 0119 10/06/21 0213  WBC 14.2* 15.3* 14.2* 15.4* 11.1* 11.9*  NEUTROABS 11.4*  --   --   --   --  8.3*  HGB 11.1* 11.4* 11.2* 10.9* 10.7* 11.1*  HCT 32.7* 33.2* 34.3* 33.7* 31.8* 33.5*  MCV 90.6 91.0 91.5 92.1 91.4 91.8  PLT 257 293 377 411* 416* 882*   Basic Metabolic Panel: Recent Labs  Lab 09/30/21 0707 10/01/21 0614 10/02/21 0409 10/03/21 0746 10/04/21 0232 10/05/21 0119 10/06/21 0213  NA 141   < > 143 141 140 139 139  K 3.6   < > 4.0 4.3 3.9 3.7 3.8  CL 111   < > 114* 109 107 106 105  CO2 20*   < > 20* 21* _0 GLUCOSE 132*   < > 78 208* 193* 137* 99  BUN 36*   < > 22* 31* 36* 24* 23*  CREATININE 1.09   < > 0.87 0.94 1.01 0.92 0.81  CALCIUM 8.9   < > 9.0 9.3 8.9 8.8* 8.9  MG 2.6*  --   --   --   --   --   --    < > = values in this interval not displayed.   GFR: Estimated Creatinine Clearance: 101.7 mL/min (by C-G formula based on SCr of 0.81 mg/dL). Liver Function Tests: No results for input(s): AST, ALT, ALKPHOS, BILITOT, PROT, ALBUMIN in the last 168 hours.  No results for input(s): LIPASE, AMYLASE in the last 168 hours. No results for input(s): AMMONIA in the last 168 hours. Coagulation Profile: No results for input(s): INR, PROTIME in the last 168 hours.  Cardiac Enzymes: No  results for input(s): CKTOTAL, CKMB, CKMBINDEX, TROPONINI in the last 168 hours. BNP (last 3 results) No results for input(s): PROBNP in the last 8760 hours. HbA1C: No results for input(s): HGBA1C in the last 72 hours. CBG: Recent Labs  Lab 10/06/21 0016 10/06/21 0434 10/06/21 0803 10/06/21 1201 10/06/21 1632  GLUCAP 137* 117* 151* 129* 108*   Lipid Profile: No results for input(s): CHOL, HDL, LDLCALC, TRIG, CHOLHDL, LDLDIRECT in the last 72 hours. Thyroid Function Tests: No results for input(s): TSH, T4TOTAL, FREET4, T3FREE, THYROIDAB in the last 72 hours. Anemia Panel: No results for input(s): VITAMINB12, FOLATE, FERRITIN, TIBC, IRON, RETICCTPCT in the last 72 hours. Urine analysis:    Component Value Date/Time   COLORURINE YELLOW 09/30/2021 1845   APPEARANCEUR CLEAR 09/30/2021 1845   LABSPEC 1.025 09/30/2021 1845   PHURINE 5.5 09/30/2021 1845   GLUCOSEU NEGATIVE 09/30/2021 1845   HGBUR  TRACE (A) 09/30/2021 1845   BILIRUBINUR NEGATIVE 09/30/2021 1845   KETONESUR NEGATIVE 09/30/2021 1845   PROTEINUR 100 (A) 09/30/2021 1845   NITRITE NEGATIVE 09/30/2021 1845   LEUKOCYTESUR SMALL (A) 09/30/2021 1845   Sepsis Labs: _0 (procalcitonin:4,lacticidven:4)  ) Recent Results (from the past 240 hour(s))  Urine Culture     Status: None   Collection Time: 09/30/21  6:25 PM   Specimen: Urine, Catheterized  Result Value Ref Range Status   Specimen Description URINE, CATHETERIZED  Final   Special Requests NONE  Final   Culture   Final    NO GROWTH Performed at [facility], [place] [address], [place], [place] [address]    Report Status 10/02/2021 FINAL  Final  Expectorated Sputum Assessment w Gram Stain, Rflx to Resp Cult     Status: None (Preliminary result)   Collection Time: 10/05/21  1:45 PM   Specimen: Expectorated Sputum  Result Value Ref Range Status   Specimen Description EXPECTORATED SPUTUM  Final   Special Requests NONE  Final   Sputum evaluation   Final     THIS SPECIMEN IS ACCEPTABLE FOR SPUTUM CULTURE Performed at [facility], [place] [address] [place], [place] [address]    Report Status PENDING  Incomplete  Culture, Respiratory w Gram Stain     Status: None (Preliminary result)   Collection Time: 10/05/21  1:45 PM  Result Value Ref Range Status   Specimen Description EXPECTORATED SPUTUM  Final   Special Requests NONE Reflexed from [id number]  Final   Gram Stain   Final    ABUNDANT WBC PRESENT, PREDOMINANTLY PMN FEW GRAM NEGATIVE RODS FEW GRAM POSITIVE COCCI IN PAIRS Performed at [facility], [place] [address], [place], [place] [address]    Culture PENDING  Incomplete   Report Status PENDING  Incomplete      Studies: No results found.  Scheduled Meds:  amLODipine  10 mg Per Tube Daily   bethanechol  25 mg Per Tube TID   carvedilol  25 mg Per Tube BID WC   chlorhexidine gluconate (MEDLINE KIT)  15 mL Mouth Rinse BID   Chlorhexidine Gluconate Cloth  6 each Topical Q0600   enoxaparin (LOVENOX) injection  40 mg Subcutaneous Q24H   feeding supplement (PROSource TF)  90 mL Per Tube BID   fiber  1 packet Per Tube TID   flecainide  150 mg Per Tube BID   free water  200 mL Per Tube Q3H   Gerhardt's butt cream   Topical BID   hydrALAZINE  50 mg Per Tube Q8H   insulin aspart  0-20 Units Subcutaneous Q4H   insulin aspart  5 Units Subcutaneous Q4H   insulin detemir  27 Units Subcutaneous BID   mouth rinse  15 mL Mouth Rinse 10 times per day   oxyCODONE  5 mg Per Tube TID   pantoprazole sodium  40 mg Per Tube QHS    Continuous Infusions:  feeding supplement (JEVITY 1.5 CAL/FIBER) 1,000 mL (10/06/21 1124)     LOS: 16 days     Alma Friendly, MD [facility]  If 7PM-7AM, please contact night-coverage [email or web address] 10/06/2021, 6:31 PM

## 2021-10-06 NOTE — Progress Notes (Signed)
STROKE TEAM PROGRESS NOTE   INTERVAL HISTORY RN at bedside.  Patient still lethargic, hard to open eyes, however purposeful moving right arm, follow simple commands on right hand and the food briskly.  Afebrile, leukocytosis improved.  Still has copious secretions, needing frequent suctioning.  Vitals:   10/06/21 1157 10/06/21 1448 10/06/21 1523 10/06/21 1537  BP: (!) 148/68  (!) 148/68   Pulse: 82 83 84 89  Resp: (!) 28 (!) 24 19 16   Temp: 99.2 F (37.3 C)  98.5 F (36.9 C)   TempSrc: Axillary  Axillary   SpO2: 96% 91% 95% 94%  Weight:      Height:       CBC:  Recent Labs  Lab 10/01/21 0614 10/02/21 0409 10/05/21 0119 10/06/21 0213  WBC 14.2*   < > 11.1* 11.9*  NEUTROABS 11.4*  --   --  8.3*  HGB 11.1*   < > 10.7* 11.1*  HCT 32.7*   < > 31.8* 33.5*  MCV 90.6   < > 91.4 91.8  PLT 257   < > 416* 421*   < > = values in this interval not displayed.   Basic Metabolic Panel:  Recent Labs  Lab 09/30/21 0707 10/01/21 0614 10/05/21 0119 10/06/21 0213  NA 141   < > 139 139  K 3.6   < > 3.7 3.8  CL 111   < > 106 105  CO2 20*   < > 24 26  GLUCOSE 132*   < > 137* 99  BUN 36*   < > 24* 23*  CREATININE 1.09   < > 0.92 0.81  CALCIUM 8.9   < > 8.8* 8.9  MG 2.6*  --   --   --    < > = values in this interval not displayed.   Lipid Panel:  No results for input(s): CHOL, TRIG, HDL, CHOLHDL, VLDL, LDLCALC in the last 168 hours.  HgbA1c: No results for input(s): HGBA1C in the last 168 hours.  Urine Drug Screen:  No results for input(s): LABOPIA, COCAINSCRNUR, LABBENZ, AMPHETMU, THCU, LABBARB in the last 168 hours.   Alcohol Level No results for input(s): ETH in the last 168 hours.  IMAGING past 24 hours No results found.  PHYSICAL EXAM  Physical Exam  Constitutional: Appears well-developed and well-nourished.  Middle-aged Caucasian male not in distress.  Status post tracheostomy Cardiovascular: Normal rate and regular rhythm.  Respiratory: Respirations even and  unlabored on trach collar  Neuro: Eyes closed, hardly open on voice.  Will follow commands with right hand and foot, no movement of LUE and LLE with hypotonia and dense left hemiplegia.   ASSESSMENT/PLAN [name] is a 61 y.o. male with history of AFib on Coumadin s/p recent conversion, HTN, HLD, remote tobacco abuse, Cataract OU, glaucoma, obesity, CVA, DM II, and OSA who initially presented to the ED via EMS left sided weakness, dysarthria, and right fixed gaze. Coumadin reversed. Labetalol and cleviprex used for BP greater than 140 during CT. Patient was then intubated due to somnolence and inability to protect his airway. CT and MRI show stable right thalamic hemorrhage with intraventricular extension. ICH score 2. Febrile. Hypertonic saline discontinued 1/23. Most recent sodium is 155.  Currently in NSR with flecainide continued. Holding home antihypertensive medications currently. No longer using cleviprex or norepinephrine (r/t hypotension after RSI) for BP control. Repeat CT shows no significant change in the hemorrhage or ventricle size. Weaning 1/28 and 1/29 at 30% , 5/5. Extubated 1/29  AM and reintubated 1/29 PM due to copious secretions.  Tracheostomy was placed 1/31. PEG tube placed 2/3. He remains on trach collar, plan to move out of ICU today and then to acute inpatient rehab.   Stroke:  Acute right thalamic ICH and IVH likely secondary to hypertension in the setting of coumadin use Code stroke CT - Positive for acute right thalamic hemorrhage with intraventricular extension. Estimated intra-axial blood volume 25 mL. Mild Regional brain edema. Trace leftward midline shift. 1/22 Repeat CT shows no changes from code stroke CT MRI  Stable right thalamic hemorrhage since presentation. Regional edema, tracking into the midbrain. 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 No increased intracranial mass effect. MRA  Abundant chronic micro-hemorrhages in the brain  superimposed on chronic lacunar infarcts compatible with advanced small vessel disease CT head 1/27 - unchanged hematoma and ventricular size Carotid Doppler  1-39% stenosis in Left and Right ICA 2D Echo EF 60-65% LDL 52 HgbA1c 9.7 VTE prophylaxis - heparin warfarin daily prior to admission, now on No antithrombotic due to [place] Therapy recommendations:  CIR Disposition:  pending  Cerebral Edema with left midline shift Stable MRI and repeat CT scan Hypertonic saline discontinued d/c Na serial checks Allow Na trending down gradually  Free water flushes given 200cc->400cc q4h->200 Q3h  Acute hypoxemic respiratory failure CCM on board Difficult intubation r/t tracheal stenosis Extubated 1/29 and reintubated Tracheostomy 1/31 PEG 2/3  Atrial Fibrillation Home meds: Flecainide, coreg, coumadin Rate controlled now on No antithrombotic due to [place] Per Dr. Leonie Man, pt is [facility]IRE candidate. Pending consent once stable  Fever, improved Blood culture 1/2 staph epidermidis Repeat blood culture no growth Tmax 102.4->100.7 -> 100.4->100.6 WBC 15.3->14.2 -> 15.4->11.1 completed rocephin course  Hypertension Home meds:  Amlodipine, coreg, losartan-hydrochlorothiazide BP goal less than 160 On coreg 25 bid, hydralazine 50 Q8h, norvasc 10 Long term BP goal normotensive  Hyperlipidemia Home meds: Lipitor 80 LDL 52, goal < 70 Consider to resume Lipitor on discharge  Diabetes type II Uncontrolled Home meds:  Metformin, jardiance HgbA1c 9.7 , goal < 7.0 CBGs SSI On levemir Close PCP follow-up as outpatient for better DM control  Dysphagia  PEG 2/3 On TF @ 50 and FW  Other Stroke Risk Factors Former smoker - quit 20 years ago Obesity, Body mass index is 35.68 kg/m., BMI >/= 30 associated with increased stroke risk, recommend weight loss, diet and exercise as appropriate  Obstructive sleep apnea  Other Active Problems   Hospital day # 16   Rosalin Hawking, MD PhD Stroke  Neurology 10/06/2021 6:59 PM     To contact Stroke Continuity provider, please refer to [email or web address] After hours, contact General Neurology

## 2021-10-07 DIAGNOSIS — N179 Acute kidney failure, unspecified: Secondary | ICD-10-CM | POA: Diagnosis not present

## 2021-10-07 DIAGNOSIS — J398 Other specified diseases of upper respiratory tract: Secondary | ICD-10-CM | POA: Diagnosis not present

## 2021-10-07 DIAGNOSIS — E1159 Type 2 diabetes mellitus with other circulatory complications: Secondary | ICD-10-CM

## 2021-10-07 DIAGNOSIS — Z7901 Long term (current) use of anticoagulants: Secondary | ICD-10-CM | POA: Diagnosis not present

## 2021-10-07 DIAGNOSIS — I152 Hypertension secondary to endocrine disorders: Secondary | ICD-10-CM

## 2021-10-07 DIAGNOSIS — I619 Nontraumatic intracerebral hemorrhage, unspecified: Secondary | ICD-10-CM | POA: Diagnosis not present

## 2021-10-07 DIAGNOSIS — I4821 Permanent atrial fibrillation: Secondary | ICD-10-CM | POA: Diagnosis not present

## 2021-10-07 LAB — CBC WITH DIFFERENTIAL/PLATELET
Abs Immature Granulocytes: 0.15 10*3/uL — ABNORMAL HIGH (ref 0.00–0.07)
Basophils Absolute: 0.1 10*3/uL (ref 0.0–0.1)
Basophils Relative: 0 %
Eosinophils Absolute: 0.5 10*3/uL (ref 0.0–0.5)
Eosinophils Relative: 4 %
HCT: 33.5 % — ABNORMAL LOW (ref 39.0–52.0)
Hemoglobin: 11 g/dL — ABNORMAL LOW (ref 13.0–17.0)
Immature Granulocytes: 1 %
Lymphocytes Relative: 12 %
Lymphs Abs: 1.5 10*3/uL (ref 0.7–4.0)
MCH: 30.2 pg (ref 26.0–34.0)
MCHC: 32.8 g/dL (ref 30.0–36.0)
MCV: 92 fL (ref 80.0–100.0)
Monocytes Absolute: 1.3 10*3/uL — ABNORMAL HIGH (ref 0.1–1.0)
Monocytes Relative: 10 %
Neutro Abs: 9.6 10*3/uL — ABNORMAL HIGH (ref 1.7–7.7)
Neutrophils Relative %: 73 %
Platelets: 465 10*3/uL — ABNORMAL HIGH (ref 150–400)
RBC: 3.64 MIL/uL — ABNORMAL LOW (ref 4.22–5.81)
RDW: 12.6 % (ref 11.5–15.5)
WBC: 13.2 10*3/uL — ABNORMAL HIGH (ref 4.0–10.5)
nRBC: 0 % (ref 0.0–0.2)

## 2021-10-07 LAB — BASIC METABOLIC PANEL
Anion gap: 10 (ref 5–15)
BUN: 21 mg/dL — ABNORMAL HIGH (ref 6–20)
CO2: 24 mmol/L (ref 22–32)
Calcium: 8.7 mg/dL — ABNORMAL LOW (ref 8.9–10.3)
Chloride: 101 mmol/L (ref 98–111)
Creatinine, Ser: 0.81 mg/dL (ref 0.61–1.24)
GFR, Estimated: >60 mL/min (ref >60)
Glucose, Bld: 144 mg/dL — ABNORMAL HIGH (ref 70–99)
Potassium: 3.9 mmol/L (ref 3.5–5.1)
Sodium: 135 mmol/L (ref 135–145)

## 2021-10-07 LAB — EXPECTORATED SPUTUM ASSESSMENT W GRAM STAIN, RFLX TO RESP C

## 2021-10-07 LAB — GLUCOSE, CAPILLARY
Glucose-Capillary: 129 mg/dL — ABNORMAL HIGH (ref 70–99)
Glucose-Capillary: 130 mg/dL — ABNORMAL HIGH (ref 70–99)
Glucose-Capillary: 139 mg/dL — ABNORMAL HIGH (ref 70–99)
Glucose-Capillary: 150 mg/dL — ABNORMAL HIGH (ref 70–99)
Glucose-Capillary: 167 mg/dL — ABNORMAL HIGH (ref 70–99)
Glucose-Capillary: 205 mg/dL — ABNORMAL HIGH (ref 70–99)
Glucose-Capillary: 96 mg/dL (ref 70–99)

## 2021-10-07 MED ORDER — SCOPOLAMINE 1 MG/3DAYS TD PT72
1.0000 | MEDICATED_PATCH | TRANSDERMAL | Status: DC
  Administered 2021-10-07 – 2021-10-13 (×3): 1.5 mg via TRANSDERMAL
  Filled 2021-10-07 (×4): qty 1

## 2021-10-07 MED ORDER — HYDRALAZINE HCL 50 MG PO TABS
100.0000 mg | ORAL_TABLET | Freq: Three times a day (TID) | ORAL | Status: DC
  Administered 2021-10-07 – 2021-10-16 (×26): 100 mg
  Filled 2021-10-07 (×26): qty 2

## 2021-10-07 NOTE — Progress Notes (Signed)
Occupational Therapy Treatment Patient Details Name: Caleb Vaughan MRN: [id number] DOB: [birth year] Today's Date: 10/07/2021   History of present illness Patient is a 61 y/o male admitted due to R thalamic hemorrhage, edema and trace leftward midline shift, intubated on admission, attempted extubation 1/29, re-intubated that evening.  Trach 1/31. Pt is s/p peg tube on 2/3. PMH positive for a-fib on coumadin, HTN, HLD, previous smoker, prior CVA, DM, OSA.   OT comments  Patient making progress with therapy treatment. Patient was able to open eyes on command and increased following directions. Once on EOB patient required assistance for balance while performing face hygiene.  2 stands attempted from EOB with patient unable to come to full stand.  Acute OT to continue to follow.    Recommendations for follow up therapy are one component of a multi-disciplinary discharge planning process, led by the attending physician.  Recommendations may be updated based on patient status, additional functional criteria and insurance authorization.    Follow Up Recommendations  Acute inpatient rehab (3hours/day)    Assistance Recommended at Discharge Frequent or constant Supervision/Assistance  Patient can return home with the following  Two people to help with walking and/or transfers;Two people to help with bathing/dressing/bathroom;Assistance with cooking/housework;Assistance with feeding;Direct supervision/assist for medications management;Direct supervision/assist for financial management;Assist for transportation;Help with stairs or ramp for entrance   Equipment Recommendations  BSC/3in1;Wheelchair (measurements OT);Wheelchair cushion (measurements OT)    Recommendations for Other Services      Precautions / Restrictions Precautions Precautions: Fall Precaution Comments: trach 10L/60% FiO2, peg tube, R wrist restraint and mitt Restrictions Weight Bearing Restrictions: No       Mobility Bed  Mobility               General bed mobility comments: bed pads used to aide in bed mobility, patient used hand gestures to communicate direction of rolling    Transfers                         Balance                                           ADL either performed or assessed with clinical judgement   ADL Overall ADL's : Needs assistance/impaired     Grooming: Minimal assistance;Sitting Grooming Details (indicate cue type and reason): patient was able to wash face while sitting on EOB with assistance for sitting balance                     Toileting- Clothing Manipulation and Hygiene: Total assistance;Bed level Toileting - Clothing Manipulation Details (indicate cue type and reason): patient rolled side to side with max assist for toilet hygiene at bed level       General ADL Comments: face hygiene seated on EOB    Extremity/Trunk Assessment Upper Extremity Assessment LUE Deficits / Details: PROM WFL; no AROM observed; edematous L hand; elevated on 2 pillows LUE Sensation: decreased proprioception;decreased light touch LUE Coordination: decreased fine motor;decreased gross motor            Technical brewer  General Comments: improvement with following commands, patient often used hand gestures to communicate        Exercises      Shoulder Instructions       General Comments VSS on trach collar, RT present during session and provides suctioning    Pertinent Vitals/ Pain          Home Living                                          Prior Functioning/Environment              Frequency  Min 2X/week        Progress Toward Goals  OT Goals(current goals can now be found in the care plan section)  Progress towards OT goals: Progressing toward goals  Acute Rehab OT Goals OT Goal Formulation:  Patient unable to participate in goal setting Time For Goal Achievement: 10/13/21 Potential to Achieve Goals: Good ADL Goals Pt Will Perform Grooming: with set-up;sitting;with supervision Pt Will Perform Upper Body Bathing: with min assist;sitting Pt Will Perform Lower Body Bathing: with mod assist;sit to/from stand Pt Will Transfer to Toilet: with mod assist;with +2 assist;bedside commode Additional ADL Goal #1: MAintain midline postural control with S to increase independence with ADL and mobility  Plan Discharge plan remains appropriate    Co-evaluation    PT/OT/SLP Co-Evaluation/Treatment: Yes Reason for Co-Treatment: Complexity of the patient's impairments (multi-system involvement);Necessary to address cognition/behavior during functional activity;For patient/therapist safety;To address functional/ADL transfers PT goals addressed during session: Mobility/safety with mobility;Balance;Strengthening/ROM OT goals addressed during session: ADL's and self-care      AM-PAC OT "6 Clicks" Daily Activity     Outcome Measure   Help from another person eating meals?: Total Help from another person taking care of personal grooming?: A Lot Help from another person toileting, which includes using toliet, bedpan, or urinal?: Total Help from another person bathing (including washing, rinsing, drying)?: Total Help from another person to put on and taking off regular upper body clothing?: Total Help from another person to put on and taking off regular lower body clothing?: Total 6 Click Score: 7    End of Session Equipment Utilized During Treatment: Gait belt;Oxygen  OT Visit Diagnosis: Unsteadiness on feet (R26.81);Other abnormalities of gait and mobility (R26.89);Muscle weakness (generalized) (M62.81);Other symptoms and signs involving the nervous system (R29.898);Other symptoms and signs involving cognitive function;Hemiplegia and hemiparesis Hemiplegia - Right/Left: Left Hemiplegia -  dominant/non-dominant: Non-Dominant Hemiplegia - caused by: Nontraumatic intracerebral hemorrhage   Activity Tolerance Patient tolerated treatment well   Patient Left in bed;with call bell/phone within reach;with bed alarm set   Nurse Communication Mobility status        Time: [id number] OT Time Calculation (min): 29 min  Charges: OT General Charges $OT Visit: 1 Visit OT Treatments $Self Care/Home Management : 8-22 mins  Lodema Hong, [place]  Pager [phone] Office [place] 10/07/2021, 8:52 AM

## 2021-10-07 NOTE — Progress Notes (Signed)
PROGRESS NOTE  Caleb Vaughan  WUJ:[id number] DOB: 1960/09/08 DOA: 09/20/2021 PCP: Pcp, No   Brief Narrative: Patient is a 61 year old male with history of A-fib on Coumadin, hyperlipidemia, hypertension, diabetes mellitus type 2, OSA, prior CVA who presented to the emergency department on 1/12 as code stroke.  He presented with left-sided weakness, dysarthria, right fixed gaze.  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  Neurology consulted.  Patient was intubated in the ED. patient was unable to wean off of mechanical ventilation so he was trached on 09/29/2021, PEG placement done on 10/02/2021.  Transferred to [facility] service on 10/05/2021.  Hospital course remarkable for persistent encephalopathy.  PT/OT recommending acute inpatient rehab.   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  CT imaging showed acute right thalamic intracerebral hemorrhage and intraventricular hemorrhage.  Likely on the background of severe hypertension/concomitant Coumadin use.  MRI of the brain showed a stable right thalamic hemorrhage, regional edema, stable to moderate intraventricular hemorrhage with no ventriculomegaly.  MRA showed chronic lacunar infarcts compatible with advanced small vessel disease.  Carotid Doppler showed 1-39% stenosis on left and right ICA. Echocardiogram showed EF of 62%.  LDL of 52.  Hemoglobin A1c of 9.7. PT/OT recommending CIR on discharge  Acute hypoxic respiratory failure: Had to be intubated on presentation.  Extubated on 1/29 and had to be reintubated.  Status post trach  placement.  On 8 to 10 L of oxygen via trach.  PCCM following intermittently  Cerebral edema with left midline shift: Stable as per MRI and follow-up CT scan.  Was initially started on hypertonic saline now discontinued.  Dysphagia: Status post PEG  Chronic A-fib: On flecainide, Coreg, Coumadin at home.  Rate is controlled.  No anticoagulation due to intracerebral hemorrhage.    Fever: Currently afebrile.  Infectious etiology is less likely .One of the blood cultures showed Staph epidermidis: Most likely contamination.  Repeat blood cultures have been negative.  Fever could be most likely secondary to intracerebral hemorrhage. Also has mild leukocytosis.  He completed the course of Rocephin.  Hypertension: On amlodipine, Coreg, losartan-hydrochlorothiazide at home.  Target blood pressures less than 160 mmHg.  Currently on Coreg, Norvasc, hydralazine with increased dose.  Monitor blood pressure  Hyperlipidemia: Takes Lipitor 80 mg at home.  LDL 52.  We will resume Lipitor on discharge  Diabetes type 2: Uncontrolled.  Hemoglobin A1c of 9.7.  On Jardiance, metformin at home.  Currently on sliding scale insulin.  Dysphagia: Status post PEG  Debility/deconditioning: PT/OT recommending CIR for him.  Prolonged hospitalization          Nutrition Problem: Inadequate oral intake Etiology: inability to eat    DVT prophylaxis:enoxaparin (LOVENOX) injection 40 mg Start: 10/06/21 1000 SCDs Start: 09/20/21 1054   Code Status:   Code Status: Full Code Family Communication:  Patient status:Remains inpatient appropriate because: Needs neurology clearance Patient is medically not stable for discharge.  Consultants: Neurology, PCCM  Procedures: Intubation, tracheostomy, PEG placement  Antimicrobials:  Anti-infectives (From admission, onward)    Start     Dose/Rate Route Frequency Ordered Stop   09/30/21 2000  cefTRIAXone (ROCEPHIN) 2 g  in sodium chloride 0.9 % 100 mL IVPB        2 g 200  mL/hr over 30 Minutes Intravenous Every 24 hours 09/30/21 1830 10/05/21 0653   09/21/21 1200  azithromycin (ZITHROMAX) 250 mg in dextrose 5 % 125 mL IVPB  Status:  Discontinued       See Hyperspace for full Linked Orders Report.   250 mg 127.5 mL/hr over 60 Minutes Intravenous Every 24 hours 09/20/21 1052 09/21/21 0856   09/20/21 1200  cefTRIAXone (ROCEPHIN) 2 g in sodium chloride 0.9 % 100 mL IVPB  Status:  Discontinued        2 g 200 mL/hr over 30 Minutes Intravenous Every 24 hours 09/20/21 1052 09/21/21 0856   09/20/21 1200  azithromycin (ZITHROMAX) 500 mg in sodium chloride 0.9 % 250 mL IVPB       See Hyperspace for full Linked Orders Report.   500 mg 250 mL/hr over 60 Minutes Intravenous  Once 09/20/21 1052 09/20/21 1528       Subjective: Patient seen and examined at the bedside this afternoon.  Hemodynamically stable.  Blood pressure on the higher side though.  Overall looks comfortable.  Opens his eyes on calling his name.  Obeys commands.  Moves his right upper and lower extremity.  Objective: Vitals:   10/07/21 0324 10/07/21 0500 10/07/21 0744 10/07/21 1153  BP: (!) 114/56  (!) 152/64 (!) 159/72  Pulse: 79  91 87  Resp: 20  (!) 30 16  Temp: 98.1 F (36.7 C)  98.5 F (36.9 C) 98 F (36.7 C)  TempSrc: Axillary  Axillary Axillary  SpO2: 94%  95% 92%  Weight:  93 kg    Height:        Intake/Output Summary (Last 24 hours) at 10/07/2021 1241 Last data filed at 10/07/2021 1000 Gross per 24 hour  Intake 1150 ml  Output 2075 ml  Net -925 ml   Filed Weights   10/05/21 0500 10/06/21 0500 10/07/21 0500  Weight: 96.2 kg 94.3 kg 93 kg    Examination:  General exam: Very deconditioned, debilitated, lying in bed HEENT: Trach Respiratory system:  no wheezes or crackles  Cardiovascular system: S1 & S2 heard, RRR.  Gastrointestinal system: Abdomen is nondistended, soft and nontender. Central nervous system: Obeys commands, left hemiplegia Extremities: No edema, no clubbing  ,no cyanosis Skin: No rashes, no ulcers,no icterus     Data Reviewed: I have personally reviewed following labs and imaging studies  CBC: Recent Labs  Lab 10/01/21 0614 10/02/21 0409 10/03/21 0746 10/04/21 0232 10/05/21 0119 10/06/21 0213 10/07/21 0219  WBC 14.2*   < > 14.2* 15.4* 11.1* 11.9* 13.2*  NEUTROABS 11.4*  --   --   --   --  8.3* 9.6*  HGB 11.1*   < > 11.2* 10.9* 10.7* 11.1* 11.0*  HCT 32.7*   < > 34.3* 33.7* 31.8* 33.5* 33.5*  MCV 90.6   < > 91.5 92.1 91.4 91.8 92.0  PLT 257   < > 377 411* 416* 421* 465*   < > = values in this interval not displayed.   Basic Metabolic Panel: Recent Labs  Lab 10/03/21 0746 10/04/21 0232 10/05/21 0119 10/06/21 0213 10/07/21 0219  NA 141 140 139 139 135  K 4.3 3.9 3.7 3.8 3.9  CL 109 107 106 105 101  CO2 21* _0 GLUCOSE 208* 193* 137* 99 144*  BUN 31* 36* 24* 23* 21*  CREATININE 0.94 1.01 0.92 0.81 0.81  CALCIUM 9.3 8.9 8.8* 8.9 8.7*     Recent Results (from the past 240 hour(s))  Urine Culture     Status: None   Collection Time: 09/30/21  6:25 PM   Specimen: Urine, Catheterized  Result Value Ref Range Status   Specimen Description URINE, CATHETERIZED  Final   Special Requests NONE  Final   Culture   Final    NO GROWTH Performed at [facility], [address], [place], [place] [address]    Report Status 10/02/2021 FINAL  Final  Expectorated Sputum Assessment w Gram Stain, Rflx to Resp Cult     Status: None (Preliminary result)   Collection Time: 10/05/21  1:45 PM   Specimen: Expectorated Sputum  Result Value Ref Range Status   Specimen Description EXPECTORATED SPUTUM  Final   Special Requests NONE  Final   Sputum evaluation   Final    THIS SPECIMEN IS ACCEPTABLE FOR SPUTUM CULTURE Performed at [facility], [place] [address], [place], [place] [address]    Report Status PENDING  Incomplete  Culture, Respiratory w Gram Stain     Status: None (Preliminary result)   Collection Time: 10/05/21   1:45 PM  Result Value Ref Range Status   Specimen Description EXPECTORATED SPUTUM  Final   Special Requests NONE Reflexed from [id number]  Final   Gram Stain   Final    ABUNDANT WBC PRESENT, PREDOMINANTLY PMN FEW GRAM NEGATIVE RODS FEW GRAM POSITIVE COCCI IN PAIRS    Culture   Final    CULTURE REINCUBATED FOR BETTER GROWTH Performed at [facility], [place] [address] [place], [place] [address]    Report Status PENDING  Incomplete     Radiology Studies: DG Chest Port 1 View  Result Date: 10/06/2021 CLINICAL DATA:  Fever EXAM: PORTABLE CHEST 1 VIEW COMPARISON:  10/01/2021, 09/26/2021 FINDINGS: Patchy airspace opacity at the left base. No pleural effusion. Atelectasis right base. Enlarged cardiomediastinal silhouette without overt pulmonary edema. No pneumothorax IMPRESSION: Mild patchy airspace opacity at the left greater than right lung base may be due to atelectasis or mild pneumonia Electronically Signed   By: Donavan Foil M.D.   On: 10/06/2021 20:04    Scheduled Meds:  amLODipine  10 mg Per Tube Daily   bethanechol  25 mg Per Tube TID   carvedilol  25 mg Per Tube BID WC   chlorhexidine gluconate (MEDLINE KIT)  15 mL Mouth Rinse BID   Chlorhexidine Gluconate Cloth  6 each Topical Q0600   enoxaparin (LOVENOX) injection  40 mg Subcutaneous Q24H   feeding supplement (PROSource TF)  90 mL Per Tube BID   fiber  1 packet Per Tube TID   flecainide  150 mg Per Tube BID   free water  200 mL Per Tube Q3H   Gerhardt's butt cream   Topical BID   hydrALAZINE  100 mg Per Tube Q8H   insulin aspart  0-20 Units Subcutaneous Q4H   insulin aspart  5 Units Subcutaneous Q4H   insulin detemir  27 Units Subcutaneous BID   mouth rinse  15 mL Mouth Rinse 10 times per day   oxyCODONE  5 mg Per Tube TID   pantoprazole sodium  40 mg Per Tube QHS   Continuous Infusions:  feeding supplement (JEVITY 1.5 CAL/FIBER) 1,000 mL (10/06/21 1124)     LOS: 17 days   Shelly Coss, MD [facility] P2/03/2022, 12:41 PM

## 2021-10-07 NOTE — Progress Notes (Signed)
Physical Therapy Treatment Patient Details Name: Caleb Vaughan MRN: [id number] DOB: November 04, 1960 Today's Date: 10/07/2021   History of Present Illness Patient is a 61 y/o male admitted due to R thalamic hemorrhage, edema and trace leftward midline shift, intubated on admission, attempted extubation 1/29, re-intubated that evening.  Trach 1/31. Pt is s/p peg tube on 2/3. PMH positive for a-fib on coumadin, HTN, HLD, previous smoker, prior CVA, DM, OSA.    PT Comments    Pt tolerates treatment well, demonstrating improvements in sitting balance. Pt is more easily redirected and sustains longer periods without pushing to left side when upright. Pt remains unable to stand at this time, largely due to lack of hip extension. Pt will benefit from continued acute PT services to improve mobility quality and reduce caregiver burden.   Recommendations for follow up therapy are one component of a multi-disciplinary discharge planning process, led by the attending physician.  Recommendations may be updated based on patient status, additional functional criteria and insurance authorization.  Follow Up Recommendations  Acute inpatient rehab (3hours/day)     Assistance Recommended at Discharge Frequent or constant Supervision/Assistance  Patient can return home with the following Two people to help with walking and/or transfers;Two people to help with bathing/dressing/bathroom;Assistance with cooking/housework;Assistance with feeding;Direct supervision/assist for medications management;Direct supervision/assist for financial management;Assist for transportation;Help with stairs or ramp for entrance   Equipment Recommendations  Wheelchair (measurements PT);Wheelchair cushion (measurements PT);Hospital bed (hoyer lift)    Recommendations for Other Services       Precautions / Restrictions Precautions Precautions: Fall Precaution Comments: trach 10L/60% FiO2, peg tube, R wrist restraint and  mitt Restrictions Weight Bearing Restrictions: No     Mobility  Bed Mobility Overal bed mobility: Needs Assistance Bed Mobility: Supine to Sit, Sit to Supine, Rolling Rolling: Max assist   Supine to sit: Max assist, +2 for physical assistance Sit to supine: Total assist, +2 for physical assistance        Transfers Overall transfer level: Needs assistance Equipment used: 2 person hand held assist Transfers: Sit to/from Stand Sit to Stand: Total assist, +2 physical assistance           General transfer comment: able to clear buttocks but not extend hips to obtain erect standing position. PT/OT providing BUE support and knee blocks    Ambulation/Gait                   Stairs             Wheelchair Mobility    Modified Rankin (Stroke Patients Only) Modified Rankin (Stroke Patients Only) Pre-Morbid Rankin Score: No symptoms Modified Rankin: Severe disability     Balance Overall balance assessment: Needs assistance Sitting-balance support: No upper extremity supported, Feet supported Sitting balance-Leahy Scale: Poor Sitting balance - Comments: minA without UE support, mod-maxA when pushing through RUE, pt is more easily redirected Postural control: Left lateral lean (pushing to left with RUE intermittently)                                  Cognition Arousal/Alertness: Awake/alert (becomes alert when sitting up) Behavior During Therapy: Flat affect Overall Cognitive Status: Difficult to assess Area of Impairment: Attention, Following commands, Safety/judgement, Awareness, Problem solving                   Current Attention Level: Focused   Following Commands: Follows one step commands with increased time,  Follows multi-step commands inconsistently Safety/Judgement: Decreased awareness of safety, Decreased awareness of deficits Awareness: Intellectual Problem Solving: Slow processing, Requires verbal cues, Requires tactile  cues          Exercises Total Joint Exercises Ankle Circles/Pumps: PROM, Left (hold for 1 minute stretch) Heel Slides: PROM, Left, 10 reps Hip ABduction/ADduction: PROM, Left, 10 reps    General Comments General comments (skin integrity, edema, etc.): VSS on trach collar, RT present during session and provides suctioning      Pertinent Vitals/Pain Pain Assessment Pain Assessment: Faces Faces Pain Scale: No hurt    Home Living                          Prior Function            PT Goals (current goals can now be found in the care plan section) Acute Rehab PT Goals Patient Stated Goal: mother hopeful for rehab Progress towards PT goals: Progressing toward goals    Frequency    Min 4X/week      PT Plan Current plan remains appropriate    Co-evaluation PT/OT/SLP Co-Evaluation/Treatment: Yes Reason for Co-Treatment: Complexity of the patient's impairments (multi-system involvement);Necessary to address cognition/behavior during functional activity;For patient/therapist safety;To address functional/ADL transfers PT goals addressed during session: Mobility/safety with mobility;Balance;Strengthening/ROM        AM-PAC PT "6 Clicks" Mobility   Outcome Measure  Help needed turning from your back to your side while in a flat bed without using bedrails?: A Lot Help needed moving from lying on your back to sitting on the side of a flat bed without using bedrails?: Total Help needed moving to and from a bed to a chair (including a wheelchair)?: Total Help needed standing up from a chair using your arms (e.g., wheelchair or bedside chair)?: Total Help needed to walk in hospital room?: Total Help needed climbing 3-5 steps with a railing? : Total 6 Click Score: 7    End of Session Equipment Utilized During Treatment: Oxygen Activity Tolerance: Patient tolerated treatment well Patient left: in bed;with call bell/phone within reach;with bed alarm set;with  restraints reapplied Nurse Communication: Mobility status;Need for lift equipment PT Visit Diagnosis: Hemiplegia and hemiparesis;Other abnormalities of gait and mobility (R26.89);Muscle weakness (generalized) (M62.81) Hemiplegia - Right/Left: Left Hemiplegia - dominant/non-dominant: Non-dominant Hemiplegia - caused by: Nontraumatic intracerebral hemorrhage     Time: [id number] PT Time Calculation (min) (ACUTE ONLY): 29 min  Charges:  $Therapeutic Activity: 8-22 mins                     Zenaida Niece, PT, DPT Acute Rehabilitation Pager: [phone] Office [phone]    Zenaida Niece 10/07/2021, 8:36 AM

## 2021-10-07 NOTE — Progress Notes (Signed)
Suctioned patient then changed his H2O bottle for trach and trach collar tubing (had secretions from coughing built up).

## 2021-10-07 NOTE — Progress Notes (Signed)
Speech Language Pathology Treatment: Hillary Bow Speaking valve  Patient Details Name: Caleb Vaughan MRN: [id number] DOB: 1961-04-29 Today's Date: 10/07/2021 Time: 8527-7824 SLP Time Calculation (min) (ACUTE ONLY): 19 min  Assessment / Plan / Recommendation Clinical Impression  Improvements today during session and he was more responsive, following commands. He attempted to protrude tongue, deliberately squeezed hand number of times requested. Compliant and opening hand to allow therapist to place restraint. He opened eyes twice today.   Increased duration of PMV donning of intervals up to three minutes. Secretions are still problematic and he can expel via trach but they quickly return during initial 5 min of session. Once cleared, valve worn without indications of air trapping/back pressure and pt with frequent "ah" vocalization and therapist unable to shape into words or indicative gesture when cued. HR and SpO2 normal- RR not reading. Pt would be good pt for a co-treat. PMV use with SLP only.    HPI HPI: Pt is a 61 y/o male who presented with left sided weakness, dysarthria, and right fixed gaze. Imaging revealed R thalamic hemorrhage, edema and trace leftward midline shift. Pt intubated in ED for increasing somnolence and concerns regarding airway protection. ETT 1/22-1/29 failed extubation due to secretions; reintubated 1/29-trach 1/31. PMH: A-fib on coumadin, HTN, HLD, previous smoker, prior CVA, DM, OSA.      SLP Plan  Continue with current plan of care      Recommendations for follow up therapy are one component of a multi-disciplinary discharge planning process, led by the attending physician.  Recommendations may be updated based on patient status, additional functional criteria and insurance authorization.    Recommendations         Patient may use Passy-Muir Speech Valve: with SLP only PMSV Supervision: Full MD: Please consider changing trach tube to : Smaller size;Cuffless          Oral Care Recommendations: Oral care BID Follow Up Recommendations: Skilled nursing-short term rehab (<3 hours/day) Assistance recommended at discharge: Frequent or constant Supervision/Assistance SLP Visit Diagnosis: Aphonia (R49.1) Plan: Continue with current plan of care           Royce Macadamia  10/07/2021, 1:36 PM

## 2021-10-07 NOTE — Progress Notes (Signed)
STROKE TEAM PROGRESS NOTE   INTERVAL HISTORY No family at bedside.  Patient still lethargic, but open eyes on voice, less secretions from trach, purposeful movement of right arm and leg, follows simple commands on the right hand and foot. BP still on the high end, will increase hydralazine.   Vitals:   10/07/21 0324 10/07/21 0500 10/07/21 0744 10/07/21 1153  BP: (!) 114/56  (!) 152/64 (!) 159/72  Pulse: 79  91 87  Resp: 20  (!) 30 16  Temp: 98.1 F (36.7 C)  98.5 F (36.9 C) 98 F (36.7 C)  TempSrc: Axillary  Axillary Axillary  SpO2: 94%  95% 92%  Weight:  93 kg    Height:       CBC:  Recent Labs  Lab 10/06/21 0213 10/07/21 0219  WBC 11.9* 13.2*  NEUTROABS 8.3* 9.6*  HGB 11.1* 11.0*  HCT 33.5* 33.5*  MCV 91.8 92.0  PLT 421* 123XX123*   Basic Metabolic Panel:  Recent Labs  Lab 10/06/21 0213 10/07/21 0219  NA 139 135  K 3.8 3.9  CL 105 101  CO2 26 24  GLUCOSE 99 144*  BUN 23* 21*  CREATININE 0.81 0.81  CALCIUM 8.9 8.7*   Lipid Panel:  No results for input(s): CHOL, TRIG, HDL, CHOLHDL, VLDL, LDLCALC in the last 168 hours.  HgbA1c: No results for input(s): HGBA1C in the last 168 hours.  Urine Drug Screen:  No results for input(s): LABOPIA, COCAINSCRNUR, LABBENZ, AMPHETMU, THCU, LABBARB in the last 168 hours.   Alcohol Level No results for input(s): ETH in the last 168 hours.  IMAGING past 24 hours DG Chest Port 1 View  Result Date: 10/06/2021 CLINICAL DATA:  Fever EXAM: PORTABLE CHEST 1 VIEW COMPARISON:  10/01/2021, 09/26/2021 FINDINGS: Patchy airspace opacity at the left base. No pleural effusion. Atelectasis right base. Enlarged cardiomediastinal silhouette without overt pulmonary edema. No pneumothorax IMPRESSION: Mild patchy airspace opacity at the left greater than right lung base may be due to atelectasis or mild pneumonia Electronically Signed   By: Donavan Foil M.D.   On: 10/06/2021 20:04    PHYSICAL EXAM  Physical Exam  Constitutional: Appears  well-developed and well-nourished.  Middle-aged Caucasian male not in distress.  Status post tracheostomy Cardiovascular: Normal rate and regular rhythm.  Respiratory: Respirations even and unlabored on trach collar  Neuro: Eyes closed, but open on voice, tracking to the right.  Left facial droop. Spontaneous movement of right UE and LE with purpose. Will follow commands with right hand and foot, no movement of LUE and LLE with hypotonia and dense left hemiplegia.   ASSESSMENT/PLAN Mr. Ashkon Romanowski is a 61 y.o. male with history of AFib on Coumadin s/p recent conversion, HTN, HLD, remote tobacco abuse, Cataract OU, glaucoma, obesity, CVA, DM II, and OSA who initially presented to the ED via EMS left sided weakness, dysarthria, and right fixed gaze. Coumadin reversed. Labetalol and cleviprex used for BP greater than 140 during CT. Patient was then intubated due to somnolence and inability to protect his airway. CT and MRI show stable right thalamic hemorrhage with intraventricular extension. ICH score 2. Febrile. Hypertonic saline discontinued 1/23. Most recent sodium is 155.  Currently in NSR with flecainide continued. Holding home antihypertensive medications currently. No longer using cleviprex or norepinephrine (r/t hypotension after RSI) for BP control. Repeat CT shows no significant change in the hemorrhage or ventricle size. Weaning 1/28 and 1/29 at 30% , 5/5. Extubated 1/29 AM and reintubated 1/29 PM due to copious secretions.  Tracheostomy was placed 1/31. PEG tube placed 2/3. He remains on trach collar, plan to move out of ICU today and then to acute inpatient rehab.   Stroke:  Acute right thalamic ICH and IVH likely secondary to hypertension in the setting of coumadin use Code stroke CT - Positive for acute right thalamic hemorrhage with intraventricular extension. Estimated intra-axial blood volume 25 mL. Mild Regional brain edema. Trace leftward midline shift. 1/22 Repeat CT shows no  changes from code stroke CT MRI  Stable right thalamic hemorrhage since presentation. Regional edema, tracking into the midbrain. 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 No increased intracranial mass effect. MRA  Abundant chronic micro-hemorrhages in the brain superimposed on chronic lacunar infarcts compatible with advanced small vessel disease CT head 1/27 - unchanged hematoma and ventricular size Carotid Doppler  1-39% stenosis in Left and Right ICA 2D Echo EF 60-65% LDL 52 HgbA1c 9.7 VTE prophylaxis - heparin warfarin daily prior to admission, now on No antithrombotic due to [place] Therapy recommendations:  CIR Disposition:  pending  Cerebral Edema with left midline shift Stable MRI and repeat CT scan Hypertonic saline discontinued d/c Na serial checks Allow Na trending down gradually  Free water flushes given 200cc->400cc q4h->200 Q3h  Acute hypoxemic respiratory failure CCM on board Difficult intubation r/t tracheal stenosis Extubated 1/29 and reintubated Tracheostomy 1/31 PEG 2/3  Atrial Fibrillation Home meds: Flecainide, coreg, coumadin Rate controlled now on No antithrombotic due to [place] Per Dr. Leonie Man, pt is ASPIRE candidate. Pending consent once stable  Fever, improved Blood culture 1/2 staph epidermidis Repeat blood culture no growth Tmax 102.4->100.7 -> 100.4->100.6->afebrile WBC 15.3->14.2 -> 15.4->11.1->13.2 completed rocephin course  Hypertension Home meds:  Amlodipine, coreg, losartan-hydrochlorothiazide BP goal less than 160 BP on the high end On coreg 25 bid and norvasc 10 Increase hydralazine to 100 Q8 Long term BP goal normotensive  Hyperlipidemia Home meds: Lipitor 80 LDL 52, goal < 70 Consider to resume Lipitor on discharge  Diabetes type II Uncontrolled Home meds:  Metformin, jardiance HgbA1c 9.7 , goal < 7.0 CBGs SSI On levemir Close PCP follow-up as outpatient for better DM control  Dysphagia  PEG 2/3 On TF @ 50 and  FW  Other Stroke Risk Factors Former smoker - quit 20 years ago Obesity, Body mass index is 35.19 kg/m., BMI >/= 30 associated with increased stroke risk, recommend weight loss, diet and exercise as appropriate  Obstructive sleep apnea  Other Active Problems   Hospital day # 17   Rosalin Hawking, MD PhD Stroke Neurology 10/07/2021 12:07 PM     To contact Stroke Continuity provider, please refer to [email or web address] After hours, contact General Neurology

## 2021-10-07 NOTE — Progress Notes (Signed)
Inpatient Rehabilitation Admissions Coordinator   Met at bedside to assess patient for rehab potential. Patient currently not at a level to tolerate the intensity required of an inpt rehab/CIR admit. I will follow his progress at a distance. TOC team notified of recommendation. I currently recommend SNF. I will follow for increased tolerance with therapy.  Danne Baxter, RN, MSN Rehab Admissions Coordinator [phone] 10/07/2021 4:36 PM

## 2021-10-08 DIAGNOSIS — I4821 Permanent atrial fibrillation: Secondary | ICD-10-CM | POA: Diagnosis not present

## 2021-10-08 DIAGNOSIS — N179 Acute kidney failure, unspecified: Secondary | ICD-10-CM | POA: Diagnosis not present

## 2021-10-08 DIAGNOSIS — E1159 Type 2 diabetes mellitus with other circulatory complications: Secondary | ICD-10-CM | POA: Diagnosis not present

## 2021-10-08 DIAGNOSIS — J398 Other specified diseases of upper respiratory tract: Secondary | ICD-10-CM | POA: Diagnosis not present

## 2021-10-08 DIAGNOSIS — I619 Nontraumatic intracerebral hemorrhage, unspecified: Secondary | ICD-10-CM | POA: Diagnosis not present

## 2021-10-08 LAB — GLUCOSE, CAPILLARY
Glucose-Capillary: 112 mg/dL — ABNORMAL HIGH (ref 70–99)
Glucose-Capillary: 115 mg/dL — ABNORMAL HIGH (ref 70–99)
Glucose-Capillary: 139 mg/dL — ABNORMAL HIGH (ref 70–99)
Glucose-Capillary: 152 mg/dL — ABNORMAL HIGH (ref 70–99)
Glucose-Capillary: 161 mg/dL — ABNORMAL HIGH (ref 70–99)
Glucose-Capillary: 164 mg/dL — ABNORMAL HIGH (ref 70–99)

## 2021-10-08 LAB — CBC WITH DIFFERENTIAL/PLATELET
Abs Immature Granulocytes: 0.14 10*3/uL — ABNORMAL HIGH (ref 0.00–0.07)
Basophils Absolute: 0 10*3/uL (ref 0.0–0.1)
Basophils Relative: 0 %
Eosinophils Absolute: 0.4 10*3/uL (ref 0.0–0.5)
Eosinophils Relative: 3 %
HCT: 32.6 % — ABNORMAL LOW (ref 39.0–52.0)
Hemoglobin: 11 g/dL — ABNORMAL LOW (ref 13.0–17.0)
Immature Granulocytes: 1 %
Lymphocytes Relative: 10 %
Lymphs Abs: 1.5 10*3/uL (ref 0.7–4.0)
MCH: 30.6 pg (ref 26.0–34.0)
MCHC: 33.7 g/dL (ref 30.0–36.0)
MCV: 90.8 fL (ref 80.0–100.0)
Monocytes Absolute: 1.6 10*3/uL — ABNORMAL HIGH (ref 0.1–1.0)
Monocytes Relative: 11 %
Neutro Abs: 10.4 10*3/uL — ABNORMAL HIGH (ref 1.7–7.7)
Neutrophils Relative %: 75 %
Platelets: 489 10*3/uL — ABNORMAL HIGH (ref 150–400)
RBC: 3.59 MIL/uL — ABNORMAL LOW (ref 4.22–5.81)
RDW: 12.6 % (ref 11.5–15.5)
WBC: 14 10*3/uL — ABNORMAL HIGH (ref 4.0–10.5)
nRBC: 0 % (ref 0.0–0.2)

## 2021-10-08 LAB — CULTURE, RESPIRATORY W GRAM STAIN: Culture: NORMAL

## 2021-10-08 NOTE — Progress Notes (Signed)
Nutrition Follow-up  DOCUMENTATION CODES:   Obesity unspecified  INTERVENTION:  Continue TF via PEG:  Jevity 1.5 @ 50 ml/hr (1200 ml/day) Prosource TF 90 ml BID   Provides 1960 kcal, 119 gm protein, 912 ml free water daily   200 ml free water every 3 hours  Total free water: 2512 ml   NUTRITION DIAGNOSIS:   Inadequate oral intake related to inability to eat as evidenced by NPO status.  ongoing  GOAL:   Patient will meet greater than or equal to 90% of their needs  Met with TF   MONITOR:   TF tolerance, Weight trends  REASON FOR ASSESSMENT:   Consult, Ventilator Enteral/tube feeding initiation and management  ASSESSMENT:   Pt with PMH of Afib on coumadin, HTN, HLD, previous smoker, previous CVA, DM, OSA now admitted with R thalamic bleed with intraventricular extension.  1/23 s/p cortrak placement; per xray tip in pyloric bulb  1/31 s/p trach  2/03 s/p EGD and PEG placed 6/06 tx to [facility] service  Per MD, pt's hospital course is remarkable for persistent encephalopathy but pt otherwise hemodynamically stable. PT/OT recommending CIR but pt does not appear ready for this level of therapy so current plan is SNF with LTC; TOC following.  Pt resting at time of RD visit. Continues to tolerate TF via PEG. Current TF: Jevity 1.5 @ 50 with 36m prosource TF BID and 203mfree water Q3H   UOP: [id number] hours I/O: +3851m33mnce admit  Current weight: 93.2 kg Admit weight: 93 kg   Medications: nutrisource fiber, SSI, novolog 5 units every 4 hours, levemir 27 units BID, protonix  Labs reviewed.  CBGs: [phone] Order:   Diet Order             Diet NPO time specified  Diet effective midnight                   EDUCATION NEEDS:   No education needs have been identified at this time  Skin:  Skin Assessment: Reviewed RN Assessment  Last BM:  2/08  Height:   Ht Readings from Last 1 Encounters:  09/20/21 _0  (1.626 m)    Weight:   Wt  Readings from Last 1 Encounters:  10/08/21 93.2 kg    BMI:  Body mass index is 35.27 kg/m.  Estimated Nutritional Needs:   Kcal:  1800-2000  Protein:  110-120 grams  Fluid:  >1.8L /day     AmanTheone StanleyS, RD, LDN (she/her/hers) RD pager number and weekend/on-call pager number located in [place]

## 2021-10-08 NOTE — Progress Notes (Addendum)
STROKE TEAM PROGRESS NOTE   INTERVAL HISTORY Patient is seen in his room with two family members at the bedside.  He is lethargic today but recently completed a therapy session.  He has been hemodynamically stable and his neurological exam is unchanged.  Vitals:   10/08/21 0825 10/08/21 0900 10/08/21 1144 10/08/21 1145  BP: (!) 163/61   126/61  Pulse: 95 99 89 83  Resp: (!) 28 16 17 20   Temp: 98.6 F (37 C)   98.4 F (36.9 C)  TempSrc: Oral   Axillary  SpO2: 93% 92% 93%   Weight:      Height:       CBC:  Recent Labs  Lab 10/07/21 0219 10/08/21 0217  WBC 13.2* 14.0*  NEUTROABS 9.6* 10.4*  HGB 11.0* 11.0*  HCT 33.5* 32.6*  MCV 92.0 90.8  PLT 465* 489*    Basic Metabolic Panel:  Recent Labs  Lab 10/06/21 0213 10/07/21 0219  NA 139 135  K 3.8 3.9  CL 105 101  CO2 26 24  GLUCOSE 99 144*  BUN 23* 21*  CREATININE 0.81 0.81  CALCIUM 8.9 8.7*    Lipid Panel:  No results for input(s): CHOL, TRIG, HDL, CHOLHDL, VLDL, LDLCALC in the last 168 hours.  HgbA1c: No results for input(s): HGBA1C in the last 168 hours.  Urine Drug Screen:  No results for input(s): LABOPIA, COCAINSCRNUR, LABBENZ, AMPHETMU, THCU, LABBARB in the last 168 hours.   Alcohol Level No results for input(s): ETH in the last 168 hours.  IMAGING past 24 hours No results found.  PHYSICAL EXAM  Physical Exam  Constitutional: Appears well-developed and well-nourished.  Middle-aged Caucasian male not in distress.  Status post tracheostomy Cardiovascular: Normal rate and regular rhythm.  Respiratory: Respirations even and unlabored on trach collar  Neuro: Eyes closed, do open on voice, tracking to the right.  Left facial droop. Spontaneous movement of right UE and LE with purpose. Will follow commands with right hand, no movement of LUE and LLE with hypotonia and dense left hemiplegia.   ASSESSMENT/PLAN Caleb Vaughan is a 61 y.o. male with history of AFib on Coumadin s/p recent conversion,  HTN, HLD, remote tobacco abuse, Cataract OU, glaucoma, obesity, CVA, DM II, and OSA who initially presented to the ED via EMS left sided weakness, dysarthria, and right fixed gaze. Coumadin reversed. Labetalol and cleviprex used for BP greater than 140 during CT. Patient was then intubated due to somnolence and inability to protect his airway. CT and MRI show stable right thalamic hemorrhage with intraventricular extension. ICH score 2. Febrile. Hypertonic saline discontinued 1/23. Most recent sodium is 155.  Currently in NSR with flecainide continued. Holding home antihypertensive medications currently. No longer using cleviprex or norepinephrine (r/t hypotension after RSI) for BP control. Repeat CT shows no significant change in the hemorrhage or ventricle size. Weaning 1/28 and 1/29 at 30% , 5/5. Extubated 1/29 AM and reintubated 1/29 PM due to copious secretions.  Tracheostomy was placed 1/31. PEG tube placed 2/3. He remains on trach collar and is awaiting placement for rehab.   Stroke:  Acute right thalamic ICH and IVH likely secondary to hypertension in the setting of coumadin use Code stroke CT - Positive for acute right thalamic hemorrhage with intraventricular extension. Estimated intra-axial blood volume 25 mL. Mild Regional brain edema. Trace leftward midline shift. 1/22 Repeat CT shows no changes from code stroke CT MRI  Stable right thalamic hemorrhage since presentation. Regional edema, tracking into the midbrain. Stable  small to moderate IVH with no ventriculomegaly. No increased intracranial mass effect. MRA  Abundant chronic micro-hemorrhages in the brain superimposed on chronic lacunar infarcts compatible with advanced small vessel disease CT head 1/27 - unchanged hematoma and ventricular size Carotid Doppler  1-39% stenosis in Left and Right ICA 2D Echo EF 60-65% LDL 52 HgbA1c 9.7 VTE prophylaxis - heparin warfarin daily prior to admission, now on No antithrombotic due to [place] Therapy  recommendations:  CIR vs. SNF Disposition:  pending  Cerebral Edema with left midline shift Stable MRI and repeat CT scan Hypertonic saline discontinued d/c Na serial checks Allow Na trending down gradually  Free water flushes given 200cc->400cc q4h->200 Q3h  Acute hypoxemic respiratory failure CCM on board Difficult intubation r/t tracheal stenosis Extubated 1/29 and reintubated Tracheostomy 1/31 PEG 2/3  Atrial Fibrillation Home meds: Flecainide, coreg, coumadin Rate controlled now on No antithrombotic due to [place] Per Dr. Leonie Man, pt is ASPIRE candidate. Pending consent once stable  Fever, improved Blood culture 1/2 staph epidermidis Repeat blood culture no growth Tmax 102.4->100.7 -> 100.4->100.6->afebrile WBC 15.3->14.2 -> 15.4->11.1->13.2 -> 14.0 completed rocephin course  Hypertension Home meds:  Amlodipine, coreg, losartan-hydrochlorothiazide BP goal less than 160 BP on the high end On coreg 25 bid and norvasc 10 Increase hydralazine to 100 Q8 Long term BP goal normotensive  Hyperlipidemia Home meds: Lipitor 80 LDL 52, goal < 70 Consider to resume Lipitor on discharge  Diabetes type II Uncontrolled Home meds:  Metformin, jardiance HgbA1c 9.7 , goal < 7.0 CBGs SSI On levemir Close PCP follow-up as outpatient for better DM control  Dysphagia  PEG 2/3 On TF @ 50 and FW  Other Stroke Risk Factors Former smoker - quit 20 years ago Obesity, Body mass index is 35.27 kg/m., BMI >/= 30 associated with increased stroke risk, recommend weight loss, diet and exercise as appropriate  Obstructive sleep apnea  Other Active Problems   Hospital day # Caleb Vaughan , MSN, AGACNP-BC [facility] See Amion for schedule and pager information 10/08/2021 1:01 PM   ATTENDING NOTE: I reviewed above note and agree with the assessment and plan. Pt was seen and examined.   Patient lying in bed, on trach collar, no acute event overnight.  Continue  current management.  Appreciate hospitalist assistance.  For detailed assessment and plan, please refer to above as I have made changes wherever appropriate.   Caleb Hawking, MD PhD Stroke Neurology 10/08/2021 6:36 PM    To contact Stroke Continuity provider, please refer to [email or web address] After hours, contact General Neurology

## 2021-10-08 NOTE — Progress Notes (Signed)
Speech Language Pathology Treatment: Caleb Vaughan Speaking valve  Patient Details Name: Caleb Vaughan MRN: [id number] DOB: 27-Oct-1960 Today's Date: 10/08/2021 Time: [id number] SLP Time Calculation (min) (ACUTE ONLY): 10 min  Assessment / Plan / Recommendation Clinical Impression  Pt seen for ongoing PMSV therapy.  Pt somnolent on arrival with secretions noted at trach hub, despite suctioning by RN around 15 minutes prior.  RN assisted with deep suctioning.  Pt with strong reflexive cough to suction with expectoration of secretions at trach, but little in suction catheter.  Pt remained somnolent throughout trach care.  Placed PMV briefly for 3-5 breaths x3.  There was wet rattling breath sounds, but no reflexive cough. Pt did not attempt to phonate.  Pt tolerated placement of valve without changes in vitals or backpressure, but given secretions and somnolence further trials were held.   Orders received for cognitive linguistic evaluation.  Pt unable to participate at this time 2/2 lethargy.  Pt was more alert during session on 2/8 and we will reattempt as able.  Pt with eyes closed throughout today's session.  Pt followed 1 of 3 single step directions today.    HPI HPI: Pt is a 61 y/o male who presented with left sided weakness, dysarthria, and right fixed gaze. Imaging revealed R thalamic hemorrhage, edema and trace leftward midline shift. Pt intubated in ED for increasing somnolence and concerns regarding airway protection. ETT 1/22-1/29 failed extubation due to secretions; reintubated 1/29-trach 1/31. PMH: A-fib on coumadin, HTN, HLD, previous smoker, prior CVA, DM, OSA.      SLP Plan  Continue with current plan of care      Recommendations for follow up therapy are one component of a multi-disciplinary discharge planning process, led by the attending physician.  Recommendations may be updated based on patient status, additional functional criteria and insurance authorization.     Recommendations         Patient may use Passy-Muir Speech Valve: with SLP only PMSV Supervision: Full MD: Please consider changing trach tube to : Smaller size;Cuffless         Oral Care Recommendations: Oral care QID Follow Up Recommendations: Skilled nursing-short term rehab (<3 hours/day) Assistance recommended at discharge: Frequent or constant Supervision/Assistance SLP Visit Diagnosis: Aphonia (R49.1) Plan: Continue with current plan of care           Celedonio Savage, [place], [place] Office: [phone]   10/08/2021, 11:12 AM

## 2021-10-08 NOTE — Progress Notes (Signed)
PROGRESS NOTE  Caleb Vaughan  FBP:[id number] DOB: 1961/02/13 DOA: 09/20/2021 PCP: Pcp, No   Brief Narrative: Patient is a 61 year old male with history of A-fib on Coumadin, hyperlipidemia, hypertension, diabetes mellitus type 2, OSA, prior CVA who presented to the emergency department on 1/12 as code stroke.  He presented with left-sided weakness, dysarthria, right fixed gaze.  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  Neurology consulted.  Patient was intubated in the ED. patient was unable to wean off of mechanical ventilation so he was trached on 09/29/2021, PEG placement done on 10/02/2021.  Transferred to [facility] service on 10/05/2021.  Hospital course remarkable for persistent encephalopathy.  PT/OT recommending CIR but looks like he will not be able to participate with therapy anytime soon so the current plan is SNF with long term care.  TOC following   Assessment & Plan: Principal Problem:   Nontraumatic thalamic hemorrhage (HCC) Active Problems:   Respiratory failure with hypoxia (HCC)   Paroxysmal A-fib (HCC)   Obstructive sleep apnea   Type 2 diabetes mellitus ([place])   Hypertension associated with diabetes ([place])   Assessment and Plan:  Acute hemorrhagic stroke: Presented with left-sided weakness, dysarthria, right fixed gaze.  CT imaging showed acute right thalamic intracerebral hemorrhage and intraventricular hemorrhage.  Likely on the background of severe hypertension/concomitant Coumadin use.  MRI of the brain showed a stable right thalamic hemorrhage, regional edema, stable to moderate intraventricular hemorrhage with no ventriculomegaly.  MRA showed chronic lacunar infarcts compatible with advanced small vessel disease.  Carotid Doppler showed 1-39% stenosis on left and right ICA. Echocardiogram showed EF of 62%.  LDL of 52.  Hemoglobin A1c of 9.7. PT/OT recommending CIR but looks like he will not be  able to participate with therapy anytime soon so the current plan is SNF with long term care.  TOC following  Acute hypoxic respiratory failure: Had to be intubated on presentation.  Extubated on 1/29 and had to be reintubated.  Status post trach placement.  On 5 L of oxygen via trach.  PCCM following intermittently  Cerebral edema with left midline shift: Stable as per MRI and follow-up CT scan.  Was initially started on hypertonic saline now discontinued.  Dysphagia: Status post PEG  Chronic A-fib: On flecainide, Coreg, Coumadin at home.  Rate is controlled.  No anticoagulation due to intracerebral hemorrhage.    Fever: Currently afebrile.  Infectious etiology is less likely .One of the blood cultures showed Staph epidermidis: Most likely contamination.  Repeat blood cultures have been negative.  Fever could be most likely secondary to intracerebral hemorrhage. Also has mild leukocytosis,we will continue to monitor.  He completed the course of Rocephin.  Hypertension: On amlodipine, Coreg, losartan-hydrochlorothiazide at home.  Target blood pressures less than 160 mmHg.  Currently on Coreg, Norvasc, hydralazine with increased dose.  Monitor blood pressure  Hyperlipidemia: Takes Lipitor 80 mg at home.  LDL 52.  We will resume Lipitor on discharge  Diabetes type 2: Uncontrolled.  Hemoglobin A1c of 9.7.  On Jardiance, metformin at home.  Currently on sliding scale insulin.  Dysphagia: Status post PEG  Debility/deconditioning: PT/OT recommending CIR , but the plan is for skilled nursing facility with long-term care. prolonged hospitalization  Obesity: BMI of 35.2          Nutrition Problem: Inadequate oral intake Etiology: inability to eat    DVT prophylaxis:enoxaparin (LOVENOX) injection 40 mg Start: 10/06/21 1000 SCDs Start: 09/20/21 1054  Code Status:   Code Status: Full Code Family Communication:  Patient status:Inpatient DC plan to: Skilled nursing facility for long-term  care Expected DC: Not sure.  Prolonged hospitalization  Consultants: Neurology, PCCM  Procedures: Intubation, tracheostomy, PEG placement  Antimicrobials:  Anti-infectives (From admission, onward)    Start     Dose/Rate Route Frequency Ordered Stop   09/30/21 2000  cefTRIAXone (ROCEPHIN) 2 g in sodium chloride 0.9 % 100 mL IVPB        2 g 200 mL/hr over 30 Minutes Intravenous Every 24 hours 09/30/21 1830 10/05/21 0653   09/21/21 1200  azithromycin (ZITHROMAX) 250 mg in dextrose 5 % 125 mL IVPB  Status:  Discontinued       See Hyperspace for full Linked Orders Report.   250 mg 127.5 mL/hr over 60 Minutes Intravenous Every 24 hours 09/20/21 1052 09/21/21 0856   09/20/21 1200  cefTRIAXone (ROCEPHIN) 2 g in sodium chloride 0.9 % 100 mL IVPB  Status:  Discontinued        2 g 200 mL/hr over 30 Minutes Intravenous Every 24 hours 09/20/21 1052 09/21/21 0856   09/20/21 1200  azithromycin (ZITHROMAX) 500 mg in sodium chloride 0.9 % 250 mL IVPB       See Hyperspace for full Linked Orders Report.   500 mg 250 mL/hr over 60 Minutes Intravenous  Once 09/20/21 1052 09/20/21 1528       Subjective: Patient seen and examined at the bedside this morning.  Hemodynamically stable.  Overall status is same as yesterday.  He opens his eyes when calling his name.  Tries to obeys command, squeezes his right hand, moves his right lower extremity. weak on the left  Objective: Vitals:   10/08/21 0825 10/08/21 0900 10/08/21 1144 10/08/21 1145  BP: (!) 163/61   126/61  Pulse: 95 99 89 83  Resp: (!) _0 Temp: 98.6 F (37 C)   98.4 F (36.9 C)  TempSrc: Oral   Axillary  SpO2: 93% 92% 93%   Weight:      Height:        Intake/Output Summary (Last 24 hours) at 10/08/2021 1353 Last data filed at 10/08/2021 1100 Gross per 24 hour  Intake 1800 ml  Output 900 ml  Net 900 ml   Filed Weights   10/06/21 0500 10/07/21 0500 10/08/21 0500  Weight: 94.3 kg 93 kg 93.2 kg    Examination:   General  exam: Extremely deconditioned, lying in bed, obese HEENT: Trach Respiratory system:  no wheezes or crackles  Cardiovascular system: S1 & S2 heard, RRR.  Gastrointestinal system: Abdomen is nondistended, soft and nontender. Central nervous system: Awake but not alert .  Orientation hard to determine.  Obeys commands.  Left hemiplegia Extremities: No edema, no clubbing ,no cyanosis Skin: No rashes, no ulcers,no icterus    Data Reviewed: I have personally reviewed following labs and imaging studies  CBC: Recent Labs  Lab 10/04/21 0232 10/05/21 0119 10/06/21 0213 10/07/21 0219 10/08/21 0217  WBC 15.4* 11.1* 11.9* 13.2* 14.0*  NEUTROABS  --   --  8.3* 9.6* 10.4*  HGB 10.9* 10.7* 11.1* 11.0* 11.0*  HCT 33.7* 31.8* 33.5* 33.5* 32.6*  MCV 92.1 91.4 91.8 92.0 90.8  PLT 411* 416* 421* 465* 544*   Basic Metabolic Panel: Recent Labs  Lab 10/03/21 0746 10/04/21 0232 10/05/21 0119 10/06/21 0213 10/07/21 0219  NA 141 140 139 139 135  K 4.3 3.9 3.7 3.8 3.9  CL 109 107 106  105 101  CO2 21* _0 GLUCOSE 208* 193* 137* 99 144*  BUN 31* 36* 24* 23* 21*  CREATININE 0.94 1.01 0.92 0.81 0.81  CALCIUM 9.3 8.9 8.8* 8.9 8.7*     Recent Results (from the past 240 hour(s))  Urine Culture     Status: None   Collection Time: 09/30/21  6:25 PM   Specimen: Urine, Catheterized  Result Value Ref Range Status   Specimen Description URINE, CATHETERIZED  Final   Special Requests NONE  Final   Culture   Final    NO GROWTH Performed at [facility], [address], [place], [place] [address]    Report Status 10/02/2021 FINAL  Final  Expectorated Sputum Assessment w Gram Stain, Rflx to Resp Cult     Status: None   Collection Time: 10/05/21  1:45 PM   Specimen: Expectorated Sputum  Result Value Ref Range Status   Specimen Description EXPECTORATED SPUTUM  Final   Special Requests NONE  Final   Sputum evaluation   Final    THIS SPECIMEN IS ACCEPTABLE FOR SPUTUM CULTURE Performed  at [facility], [place] [address] [place], [place] [address]    Report Status 10/07/2021 FINAL  Final  Culture, Respiratory w Gram Stain     Status: None   Collection Time: 10/05/21  1:45 PM  Result Value Ref Range Status   Specimen Description EXPECTORATED SPUTUM  Final   Special Requests NONE Reflexed from [id number]  Final   Gram Stain   Final    ABUNDANT WBC PRESENT, PREDOMINANTLY PMN FEW GRAM NEGATIVE RODS FEW GRAM POSITIVE COCCI IN PAIRS    Culture   Final    RARE Normal respiratory flora-no Staph aureus or Pseudomonas seen Performed at [facility], [place] [address], [place], [place] [address]    Report Status 10/08/2021 FINAL  Final     Radiology Studies: DG Chest Port 1 View  Result Date: 10/06/2021 CLINICAL DATA:  Fever EXAM: PORTABLE CHEST 1 VIEW COMPARISON:  10/01/2021, 09/26/2021 FINDINGS: Patchy airspace opacity at the left base. No pleural effusion. Atelectasis right base. Enlarged cardiomediastinal silhouette without overt pulmonary edema. No pneumothorax IMPRESSION: Mild patchy airspace opacity at the left greater than right lung base may be due to atelectasis or mild pneumonia Electronically Signed   By: Donavan Foil M.D.   On: 10/06/2021 20:04    Scheduled Meds:  amLODipine  10 mg Per Tube Daily   bethanechol  25 mg Per Tube TID   carvedilol  25 mg Per Tube BID WC   chlorhexidine gluconate (MEDLINE KIT)  15 mL Mouth Rinse BID   Chlorhexidine Gluconate Cloth  6 each Topical Q0600   enoxaparin (LOVENOX) injection  40 mg Subcutaneous Q24H   feeding supplement (PROSource TF)  90 mL Per Tube BID   fiber  1 packet Per Tube TID   flecainide  150 mg Per Tube BID   free water  200 mL Per Tube Q3H   Gerhardt's butt cream   Topical BID   hydrALAZINE  100 mg Per Tube Q8H   insulin aspart  0-20 Units Subcutaneous Q4H   insulin aspart  5 Units Subcutaneous Q4H   insulin detemir  27 Units Subcutaneous BID   mouth rinse  15 mL Mouth Rinse 10 times per day   oxyCODONE   5 mg Per Tube TID   pantoprazole sodium  40 mg Per Tube QHS   scopolamine  1 patch Transdermal  Q72H   Continuous Infusions:  feeding supplement (JEVITY 1.5 CAL/FIBER) 1,000 mL (10/07/21 1845)     LOS: 18 days   Shelly Coss, MD [facility] P2/04/2022, 1:53 PM

## 2021-10-09 ENCOUNTER — Inpatient Hospital Stay (HOSPITAL_COMMUNITY): Payer: No Typology Code available for payment source

## 2021-10-09 DIAGNOSIS — I4821 Permanent atrial fibrillation: Secondary | ICD-10-CM | POA: Diagnosis not present

## 2021-10-09 DIAGNOSIS — I619 Nontraumatic intracerebral hemorrhage, unspecified: Secondary | ICD-10-CM | POA: Diagnosis not present

## 2021-10-09 DIAGNOSIS — J398 Other specified diseases of upper respiratory tract: Secondary | ICD-10-CM | POA: Diagnosis not present

## 2021-10-09 DIAGNOSIS — N179 Acute kidney failure, unspecified: Secondary | ICD-10-CM | POA: Diagnosis not present

## 2021-10-09 DIAGNOSIS — E1159 Type 2 diabetes mellitus with other circulatory complications: Secondary | ICD-10-CM | POA: Diagnosis not present

## 2021-10-09 LAB — GLUCOSE, CAPILLARY
Glucose-Capillary: 111 mg/dL — ABNORMAL HIGH (ref 70–99)
Glucose-Capillary: 134 mg/dL — ABNORMAL HIGH (ref 70–99)
Glucose-Capillary: 136 mg/dL — ABNORMAL HIGH (ref 70–99)
Glucose-Capillary: 155 mg/dL — ABNORMAL HIGH (ref 70–99)
Glucose-Capillary: 160 mg/dL — ABNORMAL HIGH (ref 70–99)
Glucose-Capillary: 76 mg/dL (ref 70–99)

## 2021-10-09 LAB — BASIC METABOLIC PANEL
Anion gap: 9 (ref 5–15)
BUN: 25 mg/dL — ABNORMAL HIGH (ref 6–20)
CO2: 25 mmol/L (ref 22–32)
Calcium: 8.9 mg/dL (ref 8.9–10.3)
Chloride: 103 mmol/L (ref 98–111)
Creatinine, Ser: 0.92 mg/dL (ref 0.61–1.24)
GFR, Estimated: >60 mL/min (ref >60)
Glucose, Bld: 141 mg/dL — ABNORMAL HIGH (ref 70–99)
Potassium: 3.7 mmol/L (ref 3.5–5.1)
Sodium: 137 mmol/L (ref 135–145)

## 2021-10-09 LAB — CBC WITH DIFFERENTIAL/PLATELET
Abs Immature Granulocytes: 0.12 10*3/uL — ABNORMAL HIGH (ref 0.00–0.07)
Basophils Absolute: 0.1 10*3/uL (ref 0.0–0.1)
Basophils Relative: 0 %
Eosinophils Absolute: 0.4 10*3/uL (ref 0.0–0.5)
Eosinophils Relative: 2 %
HCT: 33.9 % — ABNORMAL LOW (ref 39.0–52.0)
Hemoglobin: 11 g/dL — ABNORMAL LOW (ref 13.0–17.0)
Immature Granulocytes: 1 %
Lymphocytes Relative: 10 %
Lymphs Abs: 1.5 10*3/uL (ref 0.7–4.0)
MCH: 29.9 pg (ref 26.0–34.0)
MCHC: 32.4 g/dL (ref 30.0–36.0)
MCV: 92.1 fL (ref 80.0–100.0)
Monocytes Absolute: 1.6 10*3/uL — ABNORMAL HIGH (ref 0.1–1.0)
Monocytes Relative: 10 %
Neutro Abs: 12 10*3/uL — ABNORMAL HIGH (ref 1.7–7.7)
Neutrophils Relative %: 77 %
Platelets: 539 10*3/uL — ABNORMAL HIGH (ref 150–400)
RBC: 3.68 MIL/uL — ABNORMAL LOW (ref 4.22–5.81)
RDW: 12.9 % (ref 11.5–15.5)
WBC: 15.7 10*3/uL — ABNORMAL HIGH (ref 4.0–10.5)
nRBC: 0 % (ref 0.0–0.2)

## 2021-10-09 LAB — MAGNESIUM: Magnesium: 2.5 mg/dL — ABNORMAL HIGH (ref 1.7–2.4)

## 2021-10-09 MED ORDER — SODIUM CHLORIDE 0.9 % IV SOLN
2.0000 g | INTRAVENOUS | Status: AC
  Administered 2021-10-09 – 2021-10-13 (×5): 2 g via INTRAVENOUS
  Filled 2021-10-09 (×5): qty 20

## 2021-10-09 NOTE — Evaluation (Signed)
Speech Language Pathology Evaluation Patient Details Name: Caleb Vaughan MRN: [id number] DOB: 28-Apr-1961 Today's Date: 10/09/2021 Time: [id number] SLP Time Calculation (min) (ACUTE ONLY): 8 min  Problem List:  Patient Active Problem List   Diagnosis Date Noted   Hypertension associated with diabetes ([place]) 09/28/2021   Respiratory failure with hypoxia ([place]) 09/21/2021   Paroxysmal A-fib ([place]) 09/21/2021   Obstructive sleep apnea 09/21/2021   Type 2 diabetes mellitus ([place]) 09/21/2021   Nontraumatic thalamic hemorrhage ([place]) 09/20/2021   Past Medical History: History reviewed. No pertinent past medical history. Past Surgical History:  Past Surgical History:  Procedure Laterality Date   ESOPHAGOGASTRODUODENOSCOPY N/A 10/02/2021   Procedure: ESOPHAGOGASTRODUODENOSCOPY (EGD);  Surgeon: Jesusita Oka, MD;  [facility];  Service: General;  Laterality: N/A;   PEG PLACEMENT N/A 10/02/2021   Procedure: PERCUTANEOUS ENDOSCOPIC GASTROSTOMY (PEG) PLACEMENT;  Surgeon: Jesusita Oka, MD;  Location: [facility] ENDOSCOPY;  Service: General;  Laterality: N/A;   HPI:  Pt is a 61 y/o male who presented with left sided weakness, dysarthria, and right fixed gaze. Imaging revealed R thalamic hemorrhage, edema and trace leftward midline shift. Pt intubated in ED for increasing somnolence and concerns regarding airway protection. ETT 1/22-1/29 failed extubation due to secretions; reintubated 1/29-trach 1/31. PMH: A-fib on coumadin, HTN, HLD, previous smoker, prior CVA, DM, OSA.   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  Pt as assessed informally this date.    Pt followed directions with 100% accuracy up to 3 steps for simple body movement execution.  Pt answered yes/no questions with 83% via head not shake.  Pt 100% accurate for personal and immediate environment  questions and did not respond to comparison question.  Pt continued to answer yes/no questions and indicate agreement with treatment plans with thumbs up/down after mitten removed.  This method seemed easier for pt to execute and was more easily interpreted by SLP.  Pt identified objects from field of 2 with 60% accuracy, but also seemed to be exhibiting right side preference/perseveration.    Verbal expression could not be assessed 2/2 aphonia. Pt cannot tolerate PMSV at this time.  Motor speech was not assessed for the same reason, but given impairments on OME, dysarthria is expected.  In addition to thumbs/up down, pt demonstrated ability to use gesture for expressive communication.  Pt requested a drink by pantomiming holding a cup and bringing it to his mouth.  SLP asked if pt wanted a drink to which he gave a thumbs up.  When possible, consider removal of restraints to improve pt's ability to communicate.    SLP to follow to address the above related deficits and to continue assessment as pt is able to demonstrate additional competencies.    SLP Assessment  SLP Recommendation/Assessment: Patient needs continued Speech Lanaguage Pathology Services SLP Visit Diagnosis: Aphasia (R47.01);Cognitive communication deficit (R41.841)    Recommendations for follow up therapy are one component of a multi-disciplinary discharge planning process, led by the attending physician.  Recommendations may be updated based on patient status, additional functional criteria and insurance authorization.    Follow Up Recommendations  Skilled nursing-short term rehab (<3 hours/day)    Assistance Recommended at Discharge  Frequent or constant Supervision/Assistance  Functional Status Assessment Patient has had a recent decline in their functional status and demonstrates the ability to make significant improvements in function in a reasonable and predictable amount of time.  Frequency and Duration min 2x/week  2  weeks      SLP Evaluation Cognition  Overall Cognitive Status: Difficult to assess       Comprehension  Auditory Comprehension Overall Auditory Comprehension: Impaired Yes/No Questions: Within Functional Limits Commands: Within Functional Limits Visual Recognition/Discrimination Discrimination: Exceptions to [facility] Common Objects: Able in field of 2 (60% accuracy, R perseveration)    Expression Expression Primary Mode of Expression: Nonverbal - gestures Verbal Expression Overall Verbal Expression: Impaired Common Objects: Able in field of 2 (60% accuracy, R perseveration) Non-Verbal Means of Communication: Gestures (Thumbs up/down more effective than head nod/shake) Written Expression Written Expression: Not tested   Oral / Motor  Oral Motor/Sensory Function Overall Oral Motor/Sensory Function: Moderate impairment Facial ROM: Reduced left Facial Symmetry: Abnormal symmetry left Lingual ROM: Reduced left Lingual Strength: Reduced Motor Speech Overall Motor Speech:  (Unable to assess 2/2 aphonia)            Caleb Vaughan , MA, [place] Office: [phone] 10/09/2021, 1:10 PM

## 2021-10-09 NOTE — Evaluation (Signed)
Clinical/Bedside Swallow Evaluation Patient Details  Name: Caleb Vaughan MRN: [id number] Date of Birth: 15-Jun-1961  Today's Date: 10/09/2021 Time: SLP Start Time (ACUTE ONLY): 1213 SLP Stop Time (ACUTE ONLY): 1226 SLP Time Calculation (min) (ACUTE ONLY): 13 min  Past Medical History: History reviewed. No pertinent past medical history. Past Surgical History:  Past Surgical History:  Procedure Laterality Date   ESOPHAGOGASTRODUODENOSCOPY N/A 10/02/2021   Procedure: ESOPHAGOGASTRODUODENOSCOPY (EGD);  Surgeon: Diamantina Monks, MD;  Location: [facility] ENDOSCOPY;  Service: General;  Laterality: N/A;   PEG PLACEMENT N/A 10/02/2021   Procedure: PERCUTANEOUS ENDOSCOPIC GASTROSTOMY (PEG) PLACEMENT;  Surgeon: Diamantina Monks, MD;  Location: [facility] ENDOSCOPY;  Service: General;  Laterality: N/A;   HPI:  Pt is a 61 y/o male who presented with left sided weakness, dysarthria, and right fixed gaze. Imaging revealed R thalamic hemorrhage, edema and trace leftward midline shift. Pt intubated in ED for increasing somnolence and concerns regarding airway protection. ETT 1/22-1/29 failed extubation due to secretions; reintubated 1/29-trach 1/31. PMH: A-fib on coumadin, HTN, HLD, previous smoker, prior CVA, DM, OSA.    Assessment / Plan / Recommendation  Clinical Impression  Pt exhibits clinical s/s of pharyngeal dysphagia in setting of recent CVA with tracheostomy.  Pt was given bolus trials without PMSV in place 2/2 poor tolerance.  Pt was requested something to drink (via gesture) during therapy session and decision to assess POs was made. SLP provided oral care prior to administration of trials.  Pt was not defensive to oral care, but did bite down on yankaur and swabs.  Pt seated upright in chair.  There was little oral manipulation with ice chips.  With thin liquid by spoon there was anterior spillage from L side of oral cavity.  There is significantly decreased labial ROM on L side and suspected reduced strength.  Pt  was able to siphon liquid from straw with effort.  Pt tolerated 1 of 2 straw sips.  With second trial swallow was seemingly delay and O2 desaturation occurred after the swallow to around 85 with relatively quick recovery to low-mid 90s.    Pt would benefit from instrumental assessment prior to initiation of PO diet, but is not appropriate for further assessment at this time.  SLP to follow for PO trials and readiness for MBSS/FEES.  SLP Visit Diagnosis: Dysphagia, unspecified (R13.10)    Aspiration Risk  Moderate aspiration risk;Severe aspiration risk    Diet Recommendation NPO   Medication Administration: Via alternative means    Other  Recommendations Oral Care Recommendations: Oral care QID    Recommendations for follow up therapy are one component of a multi-disciplinary discharge planning process, led by the attending physician.  Recommendations may be updated based on patient status, additional functional criteria and insurance authorization.  Follow up Recommendations Skilled nursing-short term rehab (<3 hours/day)      Assistance Recommended at Discharge Frequent or constant Supervision/Assistance  Functional Status Assessment Patient has had a recent decline in their functional status and demonstrates the ability to make significant improvements in function in a reasonable and predictable amount of time.  Frequency and Duration min 2x/week  2 weeks       Prognosis Prognosis for Safe Diet Advancement: Good      Swallow Study   General HPI: Pt is a 62 y/o male who presented with left sided weakness, dysarthria, and right fixed gaze. Imaging revealed R thalamic hemorrhage, edema and trace leftward midline shift. Pt intubated in ED for increasing somnolence and concerns regarding airway  protection. ETT 1/22-1/29 failed extubation due to secretions; reintubated 1/29-trach 1/31. PMH: A-fib on coumadin, HTN, HLD, previous smoker, prior CVA, DM, OSA. Diet Prior to this Study:  NPO Temperature Spikes Noted: No Respiratory Status: Trach Collar;Trach Trach Size and Type: Cuff;#6;Deflated History of Recent Intubation: Yes Length of Intubations (days): 9 days (x2) Date extubated: 09/29/21 (trach) Behavior/Cognition: Alert;Cooperative;Pleasant mood Oral Care Completed by SLP: Yes Oral Cavity - Dentition: Adequate natural dentition Self-Feeding Abilities: Total assist Patient Positioning: Upright in chair Volitional Swallow: Unable to elicit    Oral/Motor/Sensory Function Overall Oral Motor/Sensory Function: Moderate impairment Facial ROM: Reduced left Facial Symmetry: Abnormal symmetry left Lingual ROM: Reduced left Lingual Strength: Reduced   Ice Chips Ice chips: Impaired Oral Phase Impairments: Impaired mastication   Thin Liquid Thin Liquid: Impaired Pharyngeal  Phase Impairments: Change in Vital Signs;Suspected delayed Swallow;Decreased hyoid-laryngeal movement    Nectar Thick Nectar Thick Liquid: Not tested   Honey Thick Honey Thick Liquid: Not tested   Puree Puree: Not tested   Solid     Solid: Not tested      Kerrie Pleasure, MA, CCC-SLP Acute Rehabilitation Services Office: [phone] 10/09/2021,12:55 PM

## 2021-10-09 NOTE — Progress Notes (Signed)
Inpatient Rehabilitation Admissions Coordinator   Patient not at a level to consider CIR at this time. We will sign off.  Danne Baxter, RN, MSN Rehab Admissions Coordinator [phone] 10/09/2021 1:42 PM

## 2021-10-09 NOTE — Progress Notes (Signed)
Speech Language Pathology Treatment: [place] Speaking valve  Patient Details Name: Caleb Vaughan MRN: [id number] DOB: 07/26/1961 Today's Date: 10/09/2021 Time: 3009-2330 SLP Time Calculation (min) (ACUTE ONLY): 7 min  Assessment / Plan / Recommendation Clinical Impression  Pt continues to have copious secretions which limit ability to tolerate PMSV.  RN had provided suctioning 20-30 minutes prior and trach was clear with cuff deflated on SLP arrival.  Pt tolerated PMV placement for 3-5 breaths without change in vitals. There was no back pressure with removal.  Pt was able to phonate with encouragement, but no attempts to verbalize or answer questions.  Pt coughed it valve removal with secretions coming out of tracheostomy on 2 trials.  On 3rd trial valve placed for longer with encouragement to cough secretions up for oral expectoration.  Pt was unable and valve was removed.  Thick, red tinged secretions expectorated from trach and removed. Valve replaced.  Pt exhibited increased work of breathing and no further trials were attempted.    HPI HPI: Pt is a 61 y/o male who presented with left sided weakness, dysarthria, and right fixed gaze. Imaging revealed R thalamic hemorrhage, edema and trace leftward midline shift. Pt intubated in ED for increasing somnolence and concerns regarding airway protection. ETT 1/22-1/29 failed extubation due to secretions; reintubated 1/29-trach 1/31. PMH: A-fib on coumadin, HTN, HLD, previous smoker, prior CVA, DM, OSA.      SLP Plan  Continue with current plan of care      Recommendations for follow up therapy are one component of a multi-disciplinary discharge planning process, led by the attending physician.  Recommendations may be updated based on patient status, additional functional criteria and insurance authorization.    Recommendations         Patient may use Passy-Muir Speech Valve: with SLP only PMSV Supervision: Full MD: Please consider  changing trach tube to : Smaller size;Cuffless         Oral Care Recommendations: Oral care QID Follow Up Recommendations: Skilled nursing-short term rehab (<3 hours/day) (Consider IPR with improvement) Assistance recommended at discharge: Frequent or constant Supervision/Assistance SLP Visit Diagnosis: Aphonia (R49.1) Plan: Continue with current plan of care           Kerrie Pleasure , MA, CCC-SLP Acute Rehabilitation Services Office: [phone]  10/09/2021, 12:41 PM

## 2021-10-09 NOTE — Progress Notes (Signed)
PROGRESS NOTE  Caleb Vaughan  DPO:[id number] DOB: Jul 09, 1961 DOA: 09/20/2021 PCP: Pcp, No   Brief Narrative: Patient is a 61 year old male with history of A-fib on Coumadin, hyperlipidemia, hypertension, diabetes mellitus type 2, OSA, prior CVA who presented to the emergency department on 1/12 as code stroke.  He presented with left-sided weakness, dysarthria, right fixed gaze.  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  Neurology consulted.  Patient was intubated in the ED. patient was unable to wean off of mechanical ventilation so he was trached on 09/29/2021, PEG placement done on 10/02/2021.  Transferred to [facility] service on 10/05/2021.  Hospital course remarkable for persistent encephalopathy.  PT/OT recommending CIR but looks like he will not be able to participate with therapy anytime soon so the current plan is SNF with long term care.  TOC following   Assessment & Plan: Principal Problem:   Nontraumatic thalamic hemorrhage (HCC) Active Problems:   Respiratory failure with hypoxia (HCC)   Paroxysmal A-fib (HCC)   Obstructive sleep apnea   Type 2 diabetes mellitus ([place])   Hypertension associated with diabetes ([place])   Assessment and Plan:  Acute hemorrhagic stroke: Presented with left-sided weakness, dysarthria, right fixed gaze.  CT imaging showed acute right thalamic intracerebral hemorrhage and intraventricular hemorrhage.  Likely on the background of severe hypertension/concomitant Coumadin use.  MRI of the brain showed a stable right thalamic hemorrhage, regional edema, stable to moderate intraventricular hemorrhage with no ventriculomegaly.  MRA showed chronic lacunar infarcts compatible with advanced small vessel disease.  Carotid Doppler showed 1-39% stenosis on left and right ICA. Echocardiogram showed EF of 62%.  LDL of 52.  Hemoglobin A1c of 9.7. PT/OT recommending CIR but looks like he will not be  able to participate with therapy anytime soon so the current plan is SNF with long term care.  TOC following  Acute hypoxic respiratory failure: Had to be intubated on presentation.  Extubated on 1/29 and had to be reintubated.  Status post trach placement.  On 8 L of oxygen via trach.  PCCM following intermittently  Cerebral edema with left midline shift: Stable as per MRI and follow-up CT scan.  Was initially started on hypertonic saline now discontinued.  Dysphagia: Status post PEG  Chronic A-fib: On flecainide, Coreg, Coumadin at home.  Rate is controlled.  No anticoagulation due to intracerebral hemorrhage.    Fever/leucocytosis: Currently afebrile. One of the blood cultures showed Staph epidermidis: Most likely contamination.  Repeat blood cultures have been negative.  Fever could be most likely secondary to intracerebral hemorrhage. He completed the course of Rocephin. Tracheal aspirate is showing gram-negative rods, gram-positive cocci in pairs, final culture pending.  Since leukocytosis is worsening, will restart ceftriaxone, chest x-ray ordered.Repeat blood cultures sent.  Hypertension: On amlodipine, Coreg, losartan-hydrochlorothiazide at home.  Target blood pressures less than 160 mmHg.  Currently on Coreg, Norvasc, hydralazine with increased dose.  Monitor blood pressure  Hyperlipidemia: Takes Lipitor 80 mg at home.  LDL 52.  We will resume Lipitor on discharge  Diabetes type 2: Uncontrolled.  Hemoglobin A1c of 9.7.  On Jardiance, metformin at home.  Currently on sliding scale insulin.  Dysphagia: Status post PEG  Debility/deconditioning: PT/OT recommending CIR , but the plan is for skilled nursing facility with long-term care. prolonged hospitalization  Obesity: BMI of 35.2          Nutrition Problem: Inadequate oral intake Etiology: inability to eat    DVT prophylaxis:enoxaparin (  LOVENOX) injection 40 mg Start: 10/06/21 1000 SCDs Start: 09/20/21 1054   Code  Status:   Code Status: Full Code Family Communication:  Patient status:Inpatient DC plan to: Skilled nursing facility for long-term care Expected DC: Not sure.  Prolonged hospitalization  Consultants: Neurology, PCCM  Procedures: Intubation, tracheostomy, PEG placement  Antimicrobials:  Anti-infectives (From admission, onward)    Start     Dose/Rate Route Frequency Ordered Stop   10/09/21 0915  cefTRIAXone (ROCEPHIN) 2 g in sodium chloride 0.9 % 100 mL IVPB        2 g 200 mL/hr over 30 Minutes Intravenous Every 24 hours 10/09/21 0822     09/30/21 2000  cefTRIAXone (ROCEPHIN) 2 g in sodium chloride 0.9 % 100 mL IVPB        2 g 200 mL/hr over 30 Minutes Intravenous Every 24 hours 09/30/21 1830 10/05/21 0653   09/21/21 1200  azithromycin (ZITHROMAX) 250 mg in dextrose 5 % 125 mL IVPB  Status:  Discontinued       See Hyperspace for full Linked Orders Report.   250 mg 127.5 mL/hr over 60 Minutes Intravenous Every 24 hours 09/20/21 1052 09/21/21 0856   09/20/21 1200  cefTRIAXone (ROCEPHIN) 2 g in sodium chloride 0.9 % 100 mL IVPB  Status:  Discontinued        2 g 200 mL/hr over 30 Minutes Intravenous Every 24 hours 09/20/21 1052 09/21/21 0856   09/20/21 1200  azithromycin (ZITHROMAX) 500 mg in sodium chloride 0.9 % 250 mL IVPB       See Hyperspace for full Linked Orders Report.   500 mg 250 mL/hr over 60 Minutes Intravenous  Once 09/20/21 1052 09/20/21 1528       Subjective: Patient seen and examined at the bedside this morning.  Hemodynamically stable.  Overall on same condition like yesterday.  On 8 L of oxygen.  Obeys commands, squeezes his right hand but hemiplegic on the left.  Objective: Vitals:   10/09/21 0321 10/09/21 0501 10/09/21 0726 10/09/21 0843  BP: (!) 148/60 139/71 140/76 (!) 156/62  Pulse: 88  100   Resp: (!) 24  19 (!) 25  Temp: 98.6 F (37 C)  99.2 F (37.3 C)   TempSrc: Axillary  Axillary   SpO2: 93%   94%  Weight:      Height:        Intake/Output  Summary (Last 24 hours) at 10/09/2021 1135 Last data filed at 10/09/2021 0430 Gross per 24 hour  Intake 5538 ml  Output 725 ml  Net 4813 ml   Filed Weights   10/07/21 0500 10/08/21 0500 10/09/21 0105  Weight: 93 kg 93.2 kg 92.9 kg    Examination:   General exam: Extremely deconditioned, lying on bed, obese HEENT: Trach Respiratory system:  no wheezes or crackles  Cardiovascular system: S1 & S2 heard, RRR.  Gastrointestinal system: Abdomen is nondistended, soft and nontender. Central nervous system: awake,alert .  Orientation hard to determine.  Obeys commands.Good strength on the right side.  Left hemiplegia Extremities: No edema, no clubbing ,no cyanosis Skin: No rashes, no ulcers,no icterus    Data Reviewed: I have personally reviewed following labs and imaging studies  CBC: Recent Labs  Lab 10/05/21 0119 10/06/21 0213 10/07/21 0219 10/08/21 0217 10/09/21 0031  WBC 11.1* 11.9* 13.2* 14.0* 15.7*  NEUTROABS  --  8.3* 9.6* 10.4* 12.0*  HGB 10.7* 11.1* 11.0* 11.0* 11.0*  HCT 31.8* 33.5* 33.5* 32.6* 33.9*  MCV 91.4 91.8 92.0 90.8 92.1  PLT 416* 421* 465* 489* 929*   Basic Metabolic Panel: Recent Labs  Lab 10/04/21 0232 10/05/21 0119 10/06/21 0213 10/07/21 0219 10/09/21 0031  NA 140 139 139 135 137  K 3.9 3.7 3.8 3.9 3.7  CL 107 106 105 101 103  CO2 _0 GLUCOSE 193* 137* 99 144* 141*  BUN 36* 24* 23* 21* 25*  CREATININE 1.01 0.92 0.81 0.81 0.92  CALCIUM 8.9 8.8* 8.9 8.7* 8.9  MG  --   --   --   --  2.5*     Recent Results (from the past 240 hour(s))  Urine Culture     Status: None   Collection Time: 09/30/21  6:25 PM   Specimen: Urine, Catheterized  Result Value Ref Range Status   Specimen Description URINE, CATHETERIZED  Final   Special Requests NONE  Final   Culture   Final    NO GROWTH Performed at [facility], [address], [place], [place] [address]    Report Status 10/02/2021 FINAL  Final  Expectorated Sputum Assessment w  Gram Stain, Rflx to Resp Cult     Status: None   Collection Time: 10/05/21  1:45 PM   Specimen: Expectorated Sputum  Result Value Ref Range Status   Specimen Description EXPECTORATED SPUTUM  Final   Special Requests NONE  Final   Sputum evaluation   Final    THIS SPECIMEN IS ACCEPTABLE FOR SPUTUM CULTURE Performed at [facility], [place] [address] [place], [place] [address]    Report Status 10/07/2021 FINAL  Final  Culture, Respiratory w Gram Stain     Status: None   Collection Time: 10/05/21  1:45 PM  Result Value Ref Range Status   Specimen Description EXPECTORATED SPUTUM  Final   Special Requests NONE Reflexed from [id number]  Final   Gram Stain   Final    ABUNDANT WBC PRESENT, PREDOMINANTLY PMN FEW GRAM NEGATIVE RODS FEW GRAM POSITIVE COCCI IN PAIRS    Culture   Final    RARE Normal respiratory flora-no Staph aureus or Pseudomonas seen Performed at [facility], [facility] [address], [place], [place] [address]    Report Status 10/08/2021 FINAL  Final     Radiology Studies: No results found.  Scheduled Meds:  amLODipine  10 mg Per Tube Daily   bethanechol  25 mg Per Tube TID   carvedilol  25 mg Per Tube BID WC   chlorhexidine gluconate (MEDLINE KIT)  15 mL Mouth Rinse BID   Chlorhexidine Gluconate Cloth  6 each Topical Q0600   enoxaparin (LOVENOX) injection  40 mg Subcutaneous Q24H   feeding supplement (PROSource TF)  90 mL Per Tube BID   fiber  1 packet Per Tube TID   flecainide  150 mg Per Tube BID   free water  200 mL Per Tube Q3H   Gerhardt's butt cream   Topical BID   hydrALAZINE  100 mg Per Tube Q8H   insulin aspart  0-20 Units Subcutaneous Q4H   insulin aspart  5 Units Subcutaneous Q4H   insulin detemir  27 Units Subcutaneous BID   mouth rinse  15 mL Mouth Rinse 10 times per day   oxyCODONE  5 mg Per Tube TID   pantoprazole sodium  40 mg Per Tube QHS   scopolamine  1 patch Transdermal Q72H   Continuous Infusions:  cefTRIAXone (ROCEPHIN)  IV 2 g  (10/09/21 0946)   feeding supplement (JEVITY 1.5  CAL/FIBER) 1,000 mL (10/08/21 1615)     LOS: 19 days   Shelly Coss, MD [facility] P2/05/2022, 11:35 AM

## 2021-10-09 NOTE — Progress Notes (Signed)
ATC setup changed 

## 2021-10-09 NOTE — Progress Notes (Signed)
At 0000 rounding, RN found that Patient had 6-8 pauses and HR dropped to 45-50 but not sustained. He was diaphoretic and seemed more restless than usual. EKG obtained, NSR. RN and RT suctioned his trach and cleared a moderate amount of thick, secretions and he seemed more at ease. Prior to suction, crackles and rhonchi were heard. After suction, rhonchi remained but lungs sounded much clearer. MD made aware and nurse to monitor.

## 2021-10-09 NOTE — Progress Notes (Addendum)
STROKE TEAM PROGRESS NOTE   INTERVAL HISTORY Patient is seen in his room with therapy with at the bedside.  He has been hemodynamically stable and his neurological exam is unchanged.  Vitals:   10/09/21 0726 10/09/21 0843 10/09/21 1140 10/09/21 1203  BP: 140/76 (!) 156/62  (!) 161/71  Pulse: 100  85 83  Resp: 19 (!) 25 (!) 22 (!) 27  Temp: 99.2 F (37.3 C)     TempSrc: Axillary     SpO2:  94% 95%   Weight:      Height:       CBC:  Recent Labs  Lab 10/08/21 0217 10/09/21 0031  WBC 14.0* 15.7*  NEUTROABS 10.4* 12.0*  HGB 11.0* 11.0*  HCT 32.6* 33.9*  MCV 90.8 92.1  PLT 489* 539*    Basic Metabolic Panel:  Recent Labs  Lab 10/07/21 0219 10/09/21 0031  NA 135 137  K 3.9 3.7  CL 101 103  CO2 24 25  GLUCOSE 144* 141*  BUN 21* 25*  CREATININE 0.81 0.92  CALCIUM 8.7* 8.9  MG  --  2.5*    Lipid Panel:  No results for input(s): CHOL, TRIG, HDL, CHOLHDL, VLDL, LDLCALC in the last 168 hours.  HgbA1c: No results for input(s): HGBA1C in the last 168 hours.  Urine Drug Screen:  No results for input(s): LABOPIA, COCAINSCRNUR, LABBENZ, AMPHETMU, THCU, LABBARB in the last 168 hours.   Alcohol Level No results for input(s): ETH in the last 168 hours.  IMAGING past 24 hours DG CHEST PORT 1 VIEW  Result Date: 10/09/2021 CLINICAL DATA:  A 61 year old male presents for evaluation of leukocytosis. EXAM: PORTABLE CHEST 1 VIEW COMPARISON:  October 06, 2021. FINDINGS: Tracheostomy tube is in place with similar appearance, tip between clavicular heads in the mid trachea. Cardiomediastinal contours and hilar structures are stable with accentuation of cardiac silhouette, likely with mild enlargement due to portable technique and patient with AP projection. No signs of lobar consolidation. Linear opacity in the RIGHT mid chest is unchanged since the most recent prior, new since January of 2023. Subtle patchy opacities at the LEFT base as well not substantially changed accounting for  technique differences on the current as compared to the prior study. On limited assessment there is no acute skeletal process. IMPRESSION: Stable exam with appearance of mild patchy airspace opacities at the bases potentially atelectasis or infection. Electronically Signed   By: Donzetta Kohut M.D.   On: 10/09/2021 14:04    PHYSICAL EXAM  Physical Exam  Constitutional: Appears well-developed and well-nourished.  Middle-aged Caucasian male not in distress.  Status post tracheostomy Cardiovascular: Normal rate and regular rhythm.  Respiratory: Respirations even and unlabored on trach collar  Neuro: Eyes closed, do open on voice, tracking to the right.  Left facial droop. Spontaneous movement of right UE and LE with purpose. Will follow commands with right hand, no movement of LUE and LLE with hypotonia and dense left hemiplegia.   ASSESSMENT/PLAN [name] is a 61 y.o. male with history of AFib on Coumadin s/p recent conversion, HTN, HLD, remote tobacco abuse, Cataract OU, glaucoma, obesity, CVA, DM II, and OSA who initially presented to the ED via EMS left sided weakness, dysarthria, and right fixed gaze. Coumadin reversed. Labetalol and cleviprex used for BP greater than 140 during CT. Patient was then intubated due to somnolence and inability to protect his airway. CT and MRI show stable right thalamic hemorrhage with intraventricular extension. ICH score 2. Febrile. Hypertonic saline discontinued  1/23. Most recent sodium is 155.  Currently in NSR with flecainide continued. Holding home antihypertensive medications currently. No longer using cleviprex or norepinephrine (r/t hypotension after RSI) for BP control. Repeat CT shows no significant change in the hemorrhage or ventricle size. Weaning 1/28 and 1/29 at 30% , 5/5. Extubated 1/29 AM and reintubated 1/29 PM due to copious secretions.  Tracheostomy was placed 1/31. PEG tube placed 2/3. He remains on trach collar and is awaiting placement  for rehab/LTACH.   Stroke:  Acute right thalamic ICH and IVH likely secondary to hypertension in the setting of coumadin use Code stroke CT - Positive for acute right thalamic hemorrhage with intraventricular extension. Estimated intra-axial blood volume 25 mL. Mild Regional brain edema. Trace leftward midline shift. 1/22 Repeat CT shows no changes from code stroke CT MRI  Stable right thalamic hemorrhage since presentation. Regional edema, tracking into the midbrain. 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 No increased intracranial mass effect. MRA  Abundant chronic micro-hemorrhages in the brain superimposed on chronic lacunar infarcts compatible with advanced small vessel disease CT head 1/27 - unchanged hematoma and ventricular size Carotid Doppler  1-39% stenosis in Left and Right ICA 2D Echo EF 60-65% LDL 52 HgbA1c 9.7 VTE prophylaxis - heparin warfarin daily prior to admission, now on No antithrombotic due to ICH Therapy recommendations:  LTACH- [facility] evaluating patient. CIR signed off. Disposition:  pending  Cerebral Edema with left midline shift Stable MRI and repeat CT scan Hypertonic saline discontinued d/c Na serial checks Allow Na trending down gradually  Free water flushes given 200cc->400cc q4h->200 Q3h  Acute hypoxemic respiratory failure CCM on board Difficult intubation r/t tracheal stenosis Extubated 1/29 and reintubated Tracheostomy 1/31 PEG 2/3  Atrial Fibrillation Home meds: Flecainide, coreg, coumadin Rate controlled now on No antithrombotic due to ICH Per Dr. Pearlean Brownie, pt is [facility]IRE candidate. Pending consent once stable  Fever, improved Blood culture 1/2 staph epidermidis Repeat blood culture no growth Tmax 102.4->100.7 -> 100.4->100.6->afebrile WBC 15.3->14.2 -> 15.4->11.1->13.2 -> 14.0 -> 15.7 completed rocephin course  Hypertension Home meds:  Amlodipine, coreg, losartan-hydrochlorothiazide BP goal less than 160 BP on the high  end On coreg 25 bid and norvasc 10 Increase hydralazine to 100 Q8 Long term BP goal normotensive  Hyperlipidemia Home meds: Lipitor 80 LDL 52, goal < 70 Consider to resume Lipitor on discharge  Diabetes type II Uncontrolled Home meds:  Metformin, jardiance HgbA1c 9.7 , goal < 7.0 CBGs SSI On levemir Close PCP follow-up as outpatient for better DM control  Dysphagia  PEG 2/3 On TF @ 50 and FW  Other Stroke Risk Factors Former smoker - quit 20 years ago Obesity, Body mass index is 35.16 kg/m., BMI >/= 30 associated with increased stroke risk, recommend weight loss, diet and exercise as appropriate  Obstructive sleep apnea   Hospital day # 19  Patient seen and examined by NP/APP with MD. MD to update note as needed.   Elmer Picker, DNP, FNP-BC [facility] Pager: [phone]  ATTENDING NOTE: I reviewed above note and agree with the assessment and plan.   No acute event overnight, neuro stable.  BP improved on multiple BP meds.  No fever.  Continue current care.  Appreciate hospitalist service assistant.  We will follow.  For detailed assessment and plan, please refer to above as I have made changes wherever appropriate.   Marvel Plan, MD PhD Stroke Neurology 10/09/2021 7:13 PM     To contact Stroke Continuity provider, please refer  to [email or web address]. After hours, contact General Neurology

## 2021-10-09 NOTE — Progress Notes (Signed)
Physical Therapy Treatment Patient Details Name: Caleb Vaughan MRN: [id number] DOB: 07-20-1961 Today's Date: 10/09/2021   History of Present Illness Patient is a 61 y/o male admitted due to R thalamic hemorrhage, edema and trace leftward midline shift, intubated on admission, attempted extubation 1/29, re-intubated that evening.  Trach 1/31. Pt is s/p peg tube on 2/3. PMH positive for a-fib on coumadin, HTN, HLD, previous smoker, prior CVA, DM, OSA.    PT Comments    Focus of PT session on OOB transfer and seated balance intervention both EOB and once in recliner. Pt  continuing to require max +2 assist for bed mobility, once sitting EOB exhibits RUE pushing behaviors requiring frequent redirection. Pt consistently following commands this day with RUE/LE, and uses head nodding and thumbs up/down to communicate needs. Pt successfully transferred to recliner with squat pivot with +3 assist, will need maximove for back to bed. PT to continue to follow, plan now for [facility].     Recommendations for follow up therapy are one component of a multi-disciplinary discharge planning process, led by the attending physician.  Recommendations may be updated based on patient status, additional functional criteria and insurance authorization.  Follow Up Recommendations  PT at Long-term acute care hospital     Assistance Recommended at Discharge Frequent or constant Supervision/Assistance  Patient can return home with the following Two people to help with walking and/or transfers;Two people to help with bathing/dressing/bathroom;Assistance with cooking/housework;Assistance with feeding;Direct supervision/assist for medications management;Direct supervision/assist for financial management;Assist for transportation;Help with stairs or ramp for entrance   Equipment Recommendations  Wheelchair (measurements PT);Wheelchair cushion (measurements PT);Hospital bed    Recommendations for Other Services        Precautions / Restrictions Precautions Precautions: Fall Precaution Comments: trach 10L/60% FiO2, peg tube, R wrist restraint and mitt Restrictions Weight Bearing Restrictions: No     Mobility  Bed Mobility Overal bed mobility: Needs Assistance Bed Mobility: Rolling, Supine to Sit Rolling: Max assist   Supine to sit: Max assist, +2 for physical assistance     General bed mobility comments: max +2 for roll bilat for trunk and LE translation, BM cleanup, trunk elevation and LE lowering over EOB to transition to EOB sitting.    Transfers Overall transfer level: Needs assistance Equipment used: 2 person hand held assist Transfers: Bed to chair/wheelchair/BSC            Lateral/Scoot Transfers: +2 physical assistance, Max assist General transfer comment: max +2 for squat pivot to drop arm recliner towards pt's R, cues for sequencing, pt assisting with min rise from EOB and places RUE around PT to prevent L pushing. +3 RN holding chair to prevent translation    Ambulation/Gait                   Stairs             Wheelchair Mobility    Modified Rankin (Stroke Patients Only) Modified Rankin (Stroke Patients Only) Pre-Morbid Rankin Score: No symptoms Modified Rankin: Severe disability     Balance Overall balance assessment: Needs assistance Sitting-balance support: No upper extremity supported, Feet supported Sitting balance-Leahy Scale: Poor Sitting balance - Comments: preference for L pushing Postural control: Left lateral lean                                  Cognition Arousal/Alertness: Awake/alert (drowsy) Behavior During Therapy: Flat affect Overall Cognitive Status: Difficult to assess  Area of Impairment: Attention, Following commands                   Current Attention Level: Focused   Following Commands: Follows one step commands consistently Safety/Judgement: Decreased awareness of safety, Decreased awareness of  deficits   Problem Solving: Requires tactile cues, Requires verbal cues, Difficulty sequencing General Comments: pt waving, performing peace sign, and nods yes/no vs thumbs up/down to PT questions. Pt consistently following commands on RLE        Exercises General Exercises - Lower Extremity Long Arc Quad: 10 reps, Seated, AROM, Right    General Comments        Pertinent Vitals/Pain Pain Assessment Pain Assessment: Faces Faces Pain Scale: Hurts a little bit Pain Location: generalized discomfort Pain Descriptors / Indicators: Discomfort, Grimacing, Guarding Pain Intervention(s): Limited activity within patient's tolerance, Monitored during session, Repositioned    Home Living                          Prior Function            PT Goals (current goals can now be found in the care plan section) Acute Rehab PT Goals Patient Stated Goal: rehab post-acute PT Goal Formulation: With family Time For Goal Achievement: 10/12/21 Potential to Achieve Goals: Fair Progress towards PT goals: Progressing toward goals    Frequency    Min 4X/week      PT Plan Current plan remains appropriate    Co-evaluation              AM-PAC PT "6 Clicks" Mobility   Outcome Measure  Help needed turning from your back to your side while in a flat bed without using bedrails?: A Lot Help needed moving from lying on your back to sitting on the side of a flat bed without using bedrails?: Total Help needed moving to and from a bed to a chair (including a wheelchair)?: Total Help needed standing up from a chair using your arms (e.g., wheelchair or bedside chair)?: Total Help needed to walk in hospital room?: Total Help needed climbing 3-5 steps with a railing? : Total 6 Click Score: 7    End of Session   Activity Tolerance: Patient tolerated treatment well Patient left: with bed alarm set;with restraints reapplied;in chair;with chair alarm set;with call bell/phone within reach  (R wrist restraint and R handmitt) Nurse Communication: Mobility status;Need for lift equipment (maximove pad placed) PT Visit Diagnosis: Hemiplegia and hemiparesis;Other abnormalities of gait and mobility (R26.89);Muscle weakness (generalized) (M62.81) Hemiplegia - Right/Left: Left Hemiplegia - dominant/non-dominant: Non-dominant Hemiplegia - caused by: Nontraumatic intracerebral hemorrhage     Time: 1110-1157 PT Time Calculation (min) (ACUTE ONLY): 47 min  Charges:  $Therapeutic Activity: 23-37 mins $Neuromuscular Re-education: 8-22 mins                     Stacie Glaze, PT DPT Acute Rehabilitation Services Pager [place] [phone]    Caleb Vaughan 10/09/2021, 1:16 PM

## 2021-10-09 NOTE — TOC Progression Note (Signed)
Transition of Care [facility]) - Progression Note    Patient Details  Name: Dereke Neumann MRN: [id number] Date of Birth: 12-08-1960  Transition of Care [facility]) CM/SW Contact  Kermit Balo, RN Phone Number: 10/09/2021, 2:05 PM  Clinical Narrative:    Pt may be a candidate for LTACH. CM asked pt's son and mother and they agree to have [facility] look at his information and start insurance auth through [facility] if they feel he will qualify. CM has updated [facility] to start insurance if pt is a good candidate. TOC following.  Expected Discharge Plan:  (TBD)    Expected Discharge Plan and Services Expected Discharge Plan:  (TBD)   Discharge Planning Services: CM Consult   Living arrangements for the past 2 months: Single Family Home                                       Social Determinants of Health (SDOH) Interventions    Readmission Risk Interventions No flowsheet data found.

## 2021-10-10 DIAGNOSIS — N179 Acute kidney failure, unspecified: Secondary | ICD-10-CM | POA: Diagnosis not present

## 2021-10-10 DIAGNOSIS — E1159 Type 2 diabetes mellitus with other circulatory complications: Secondary | ICD-10-CM | POA: Diagnosis not present

## 2021-10-10 DIAGNOSIS — J398 Other specified diseases of upper respiratory tract: Secondary | ICD-10-CM | POA: Diagnosis not present

## 2021-10-10 DIAGNOSIS — I4821 Permanent atrial fibrillation: Secondary | ICD-10-CM | POA: Diagnosis not present

## 2021-10-10 DIAGNOSIS — I619 Nontraumatic intracerebral hemorrhage, unspecified: Secondary | ICD-10-CM | POA: Diagnosis not present

## 2021-10-10 LAB — CBC WITH DIFFERENTIAL/PLATELET
Abs Immature Granulocytes: 0.1 10*3/uL — ABNORMAL HIGH (ref 0.00–0.07)
Basophils Absolute: 0 10*3/uL (ref 0.0–0.1)
Basophils Relative: 0 %
Eosinophils Absolute: 0.3 10*3/uL (ref 0.0–0.5)
Eosinophils Relative: 3 %
HCT: 30.7 % — ABNORMAL LOW (ref 39.0–52.0)
Hemoglobin: 9.9 g/dL — ABNORMAL LOW (ref 13.0–17.0)
Immature Granulocytes: 1 %
Lymphocytes Relative: 12 %
Lymphs Abs: 1.4 10*3/uL (ref 0.7–4.0)
MCH: 30.1 pg (ref 26.0–34.0)
MCHC: 32.2 g/dL (ref 30.0–36.0)
MCV: 93.3 fL (ref 80.0–100.0)
Monocytes Absolute: 1.5 10*3/uL — ABNORMAL HIGH (ref 0.1–1.0)
Monocytes Relative: 13 %
Neutro Abs: 8 10*3/uL — ABNORMAL HIGH (ref 1.7–7.7)
Neutrophils Relative %: 71 %
Platelets: 451 10*3/uL — ABNORMAL HIGH (ref 150–400)
RBC: 3.29 MIL/uL — ABNORMAL LOW (ref 4.22–5.81)
RDW: 12.6 % (ref 11.5–15.5)
WBC: 11.4 10*3/uL — ABNORMAL HIGH (ref 4.0–10.5)
nRBC: 0 % (ref 0.0–0.2)

## 2021-10-10 LAB — BASIC METABOLIC PANEL
Anion gap: 10 (ref 5–15)
BUN: 20 mg/dL (ref 6–20)
CO2: 27 mmol/L (ref 22–32)
Calcium: 8.8 mg/dL — ABNORMAL LOW (ref 8.9–10.3)
Chloride: 103 mmol/L (ref 98–111)
Creatinine, Ser: 0.86 mg/dL (ref 0.61–1.24)
GFR, Estimated: >60 mL/min (ref >60)
Glucose, Bld: 138 mg/dL — ABNORMAL HIGH (ref 70–99)
Potassium: 3.4 mmol/L — ABNORMAL LOW (ref 3.5–5.1)
Sodium: 140 mmol/L (ref 135–145)

## 2021-10-10 LAB — GLUCOSE, CAPILLARY
Glucose-Capillary: 111 mg/dL — ABNORMAL HIGH (ref 70–99)
Glucose-Capillary: 118 mg/dL — ABNORMAL HIGH (ref 70–99)
Glucose-Capillary: 129 mg/dL — ABNORMAL HIGH (ref 70–99)
Glucose-Capillary: 157 mg/dL — ABNORMAL HIGH (ref 70–99)
Glucose-Capillary: 166 mg/dL — ABNORMAL HIGH (ref 70–99)
Glucose-Capillary: 198 mg/dL — ABNORMAL HIGH (ref 70–99)

## 2021-10-10 MED ORDER — POTASSIUM CHLORIDE 20 MEQ PO PACK
40.0000 meq | PACK | Freq: Once | ORAL | Status: AC
  Administered 2021-10-10: 40 meq
  Filled 2021-10-10: qty 2

## 2021-10-10 NOTE — Progress Notes (Signed)
PROGRESS NOTE  Caleb Vaughan  SNK:[id number] DOB: 10/07/1960 DOA: 09/20/2021 PCP: Pcp, No   Brief Narrative: Patient is a 61 year old male with history of A-fib on Coumadin, hyperlipidemia, hypertension, diabetes mellitus type 2, OSA, prior CVA who presented to the emergency department on 1/12 as code stroke.  He presented with left-sided weakness, dysarthria, right fixed gaze.  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  Neurology consulted.  Patient was intubated in the ED. patient was unable to wean off of mechanical ventilation so he was trached on 09/29/2021, PEG placement done on 10/02/2021.  Transferred to [facility] service on 10/05/2021.  Hospital course remarkable for persistent encephalopathy.  PT/OT recommending CIR but looks like he will not be able to participate with therapy anytime soon so the current plan is SNF vs LTACH.  TOC following   Assessment & Plan: Principal Problem:   Nontraumatic thalamic hemorrhage (HCC) Active Problems:   Respiratory failure with hypoxia (HCC)   Paroxysmal A-fib (HCC)   Obstructive sleep apnea   Type 2 diabetes mellitus ([place])   Hypertension associated with diabetes ([place])   Assessment and Plan:  Acute hemorrhagic stroke: Presented with left-sided weakness, dysarthria, right fixed gaze.  CT imaging showed acute right thalamic intracerebral hemorrhage and intraventricular hemorrhage.  Likely on the background of severe hypertension/concomitant Coumadin use.  MRI of the brain showed a stable right thalamic hemorrhage, regional edema, stable to moderate intraventricular hemorrhage with no ventriculomegaly.  MRA showed chronic lacunar infarcts compatible with advanced small vessel disease.  Carotid Doppler showed 1-39% stenosis on left and right ICA. Echocardiogram showed EF of 62%.  LDL of 52.  Hemoglobin A1c of 9.7. Current plan is SNF with long term care vs LTACH.  TOC  following  Acute hypoxic respiratory failure: Had to be intubated on presentation.  Extubated on 1/29 and had to be reintubated.  Status post trach placement.  On 8 L of oxygen via trach.  PCCM following intermittently  Cerebral edema with left midline shift: Stable as per MRI and follow-up CT scan.  Was initially started on hypertonic saline now discontinued.  Dysphagia: Status post PEG  Chronic A-fib: On flecainide, Coreg, Coumadin at home.  Rate is controlled.  No anticoagulation due to intracerebral hemorrhage.    Fever/leucocytosis: Currently afebrile. One of the blood cultures showed Staph epidermidis: Most likely contamination.  Repeat blood cultures have been negative.  Fever was most likely secondary to intracerebral hemorrhage. He completed the course of Rocephin. Tracheal aspirate is showing gram-negative rods, gram-positive cocci in pairs, final culture pending.  Since leukocytosis was worsening,  restarted ceftriaxone.chest x-ray showed mild patchy airspace opacities at the bases potentially atelectasis or infection. repeat blood cultures sent.  Hypertension: On amlodipine, Coreg, losartan-hydrochlorothiazide at home.  Target blood pressures less than 160 mmHg.  Currently on Coreg, Norvasc, hydralazine with increased dose.  Monitor blood pressure  Hyperlipidemia: Takes Lipitor 80 mg at home.  LDL 52.  We will resume Lipitor on discharge  Diabetes type 2: Uncontrolled.  Hemoglobin A1c of 9.7.  On Jardiance, metformin at home.  Currently on sliding scale insulin.  Dysphagia: Status post PEG  Debility/deconditioning: PT/OT recommending CIR , but the plan is for skilled nursing facility with long-term care. prolonged hospitalization  Obesity: BMI of 35.2          Nutrition Problem: Inadequate oral intake Etiology: inability to eat    DVT prophylaxis:enoxaparin (LOVENOX) injection 40 mg Start: 10/06/21 1000 SCDs Start:  09/20/21 1054   Code Status:   Code Status: Full  Code Family Communication:  Patient status:Inpatient DC plan to: Skilled nursing facility for long-term care vs LTACH Expected DC: Not sure.  Prolonged hospitalization  Consultants: Neurology, PCCM  Procedures: Intubation, tracheostomy, PEG placement  Antimicrobials:  Anti-infectives (From admission, onward)    Start     Dose/Rate Route Frequency Ordered Stop   10/09/21 0915  cefTRIAXone (ROCEPHIN) 2 g in sodium chloride 0.9 % 100 mL IVPB        2 g 200 mL/hr over 30 Minutes Intravenous Every 24 hours 10/09/21 0822     09/30/21 2000  cefTRIAXone (ROCEPHIN) 2 g in sodium chloride 0.9 % 100 mL IVPB        2 g 200 mL/hr over 30 Minutes Intravenous Every 24 hours 09/30/21 1830 10/05/21 0653   09/21/21 1200  azithromycin (ZITHROMAX) 250 mg in dextrose 5 % 125 mL IVPB  Status:  Discontinued       See Hyperspace for full Linked Orders Report.   250 mg 127.5 mL/hr over 60 Minutes Intravenous Every 24 hours 09/20/21 1052 09/21/21 0856   09/20/21 1200  cefTRIAXone (ROCEPHIN) 2 g in sodium chloride 0.9 % 100 mL IVPB  Status:  Discontinued        2 g 200 mL/hr over 30 Minutes Intravenous Every 24 hours 09/20/21 1052 09/21/21 0856   09/20/21 1200  azithromycin (ZITHROMAX) 500 mg in sodium chloride 0.9 % 250 mL IVPB       See Hyperspace for full Linked Orders Report.   500 mg 250 mL/hr over 60 Minutes Intravenous  Once 09/20/21 1052 09/20/21 1528       Subjective: Patient seen and examined at the bedside this morning.  He appears more alert today.  Obeys commands.  Not in any kind of distress.  On 8 L of oxygen via trach collar  Objective: Vitals:   10/10/21 0400 10/10/21 0528 10/10/21 0728 10/10/21 0751  BP:  (!) 167/79 (!) 150/69   Pulse: 91 80 93 97  Resp: (!) 27 (!) 22 (!) 29 (!) 30  Temp:   98.8 F (37.1 C)   TempSrc:   Axillary   SpO2: (!) 89% 93%  91%  Weight:      Height:        Intake/Output Summary (Last 24 hours) at 10/10/2021 1041 Last data filed at 10/10/2021  0400 Gross per 24 hour  Intake 850 ml  Output 1410 ml  Net -560 ml   Filed Weights   10/07/21 0500 10/08/21 0500 10/09/21 0105  Weight: 93 kg 93.2 kg 92.9 kg    Examination:   General exam: Extremely deconditioned, lying in bed, obese HEENT: Trach Respiratory system:  no wheezes or crackles  Cardiovascular system: S1 & S2 heard, RRR.  Gastrointestinal system: Abdomen is nondistended, soft and nontender. Central nervous system: Alert and awake, obeys commands, dense hemiplegia on the left Extremities: No edema, no clubbing ,no cyanosis Skin: No rashes, no ulcers,no icterus    Data Reviewed: I have personally reviewed following labs and imaging studies  CBC: Recent Labs  Lab 10/06/21 0213 10/07/21 0219 10/08/21 0217 10/09/21 0031 10/10/21 0103  WBC 11.9* 13.2* 14.0* 15.7* 11.4*  NEUTROABS 8.3* 9.6* 10.4* 12.0* 8.0*  HGB 11.1* 11.0* 11.0* 11.0* 9.9*  HCT 33.5* 33.5* 32.6* 33.9* 30.7*  MCV 91.8 92.0 90.8 92.1 93.3  PLT 421* 465* 489* 539* 621*   Basic Metabolic Panel: Recent Labs  Lab 10/05/21 0119 10/06/21 3086  10/07/21 0219 10/09/21 0031 10/10/21 0103  NA 139 139 135 137 140  K 3.7 3.8 3.9 3.7 3.4*  CL 106 105 101 103 103  CO2 _0 GLUCOSE 137* 99 144* 141* 138*  BUN 24* 23* 21* 25* 20  CREATININE 0.92 0.81 0.81 0.92 0.86  CALCIUM 8.8* 8.9 8.7* 8.9 8.8*  MG  --   --   --  2.5*  --      Recent Results (from the past 240 hour(s))  Urine Culture     Status: None   Collection Time: 09/30/21  6:25 PM   Specimen: Urine, Catheterized  Result Value Ref Range Status   Specimen Description URINE, CATHETERIZED  Final   Special Requests NONE  Final   Culture   Final    NO GROWTH Performed at [facility], [address], [place], [place] [address]    Report Status 10/02/2021 FINAL  Final  Expectorated Sputum Assessment w Gram Stain, Rflx to Resp Cult     Status: None   Collection Time: 10/05/21  1:45 PM   Specimen: Expectorated Sputum   Result Value Ref Range Status   Specimen Description EXPECTORATED SPUTUM  Final   Special Requests NONE  Final   Sputum evaluation   Final    THIS SPECIMEN IS ACCEPTABLE FOR SPUTUM CULTURE Performed at [facility], [place] [address] [place], [place] [address]    Report Status 10/07/2021 FINAL  Final  Culture, Respiratory w Gram Stain     Status: None   Collection Time: 10/05/21  1:45 PM  Result Value Ref Range Status   Specimen Description EXPECTORATED SPUTUM  Final   Special Requests NONE Reflexed from [id number]  Final   Gram Stain   Final    ABUNDANT WBC PRESENT, PREDOMINANTLY PMN FEW GRAM NEGATIVE RODS FEW GRAM POSITIVE COCCI IN PAIRS    Culture   Final    RARE Normal respiratory flora-no Staph aureus or Pseudomonas seen Performed at [facility], [place] [address], [place], [place] [address]    Report Status 10/08/2021 FINAL  Final  Culture, blood (routine x 2)     Status: None (Preliminary result)   Collection Time: 10/09/21  9:21 AM   Specimen: BLOOD  Result Value Ref Range Status   Specimen Description BLOOD BLOOD LEFT HAND  Final   Special Requests   Final    BOTTLES DRAWN AEROBIC AND ANAEROBIC Blood Culture adequate volume   Culture   Final    NO GROWTH < 24 HOURS Performed at [facility], [place] [address], [place], [place] [address]    Report Status PENDING  Incomplete  Culture, blood (routine x 2)     Status: None (Preliminary result)   Collection Time: 10/09/21  9:22 AM   Specimen: BLOOD  Result Value Ref Range Status   Specimen Description BLOOD BLOOD RIGHT HAND  Final   Special Requests AEROBIC BOTTLE ONLY Blood Culture adequate volume  Final   Culture   Final    NO GROWTH < 24 HOURS Performed at [facility], [place] [address] [place], [place] [address]    Report Status PENDING  Incomplete     Radiology Studies: DG CHEST PORT 1 VIEW  Result Date: 10/09/2021 CLINICAL DATA:  A 61 year old male presents for evaluation of leukocytosis.  EXAM: PORTABLE CHEST 1 VIEW COMPARISON:  October 06, 2021. FINDINGS: Tracheostomy tube is in place with similar appearance, tip between clavicular  heads in the mid trachea. Cardiomediastinal contours and hilar structures are stable with accentuation of cardiac silhouette, likely with mild enlargement due to portable technique and patient with AP projection. No signs of lobar consolidation. Linear opacity in the RIGHT mid chest is unchanged since the most recent prior, new since January of 2023. Subtle patchy opacities at the LEFT base as well not substantially changed accounting for technique differences on the current as compared to the prior study. On limited assessment there is no acute skeletal process. IMPRESSION: Stable exam with appearance of mild patchy airspace opacities at the bases potentially atelectasis or infection. Electronically Signed   By: Zetta Bills M.D.   On: 10/09/2021 14:04    Scheduled Meds:  amLODipine  10 mg Per Tube Daily   bethanechol  25 mg Per Tube TID   carvedilol  25 mg Per Tube BID WC   chlorhexidine gluconate (MEDLINE KIT)  15 mL Mouth Rinse BID   Chlorhexidine Gluconate Cloth  6 each Topical Q0600   enoxaparin (LOVENOX) injection  40 mg Subcutaneous Q24H   feeding supplement (PROSource TF)  90 mL Per Tube BID   fiber  1 packet Per Tube TID   flecainide  150 mg Per Tube BID   free water  200 mL Per Tube Q3H   Gerhardt's butt cream   Topical BID   hydrALAZINE  100 mg Per Tube Q8H   insulin aspart  0-20 Units Subcutaneous Q4H   insulin aspart  5 Units Subcutaneous Q4H   insulin detemir  27 Units Subcutaneous BID   mouth rinse  15 mL Mouth Rinse 10 times per day   oxyCODONE  5 mg Per Tube TID   pantoprazole sodium  40 mg Per Tube QHS   scopolamine  1 patch Transdermal Q72H   Continuous Infusions:  cefTRIAXone (ROCEPHIN)  IV 2 g (10/10/21 0940)   feeding supplement (JEVITY 1.5 CAL/FIBER) 1,000 mL (10/09/21 1755)     LOS: 20 days   Shelly Coss,  MD [facility] P2/06/2022, 10:41 AM

## 2021-10-10 NOTE — Progress Notes (Signed)
STROKE TEAM PROGRESS NOTE   INTERVAL HISTORY Patient is seen in his room with his brother at the bedside.  He has been hemodynamically stable and his neurological exam is unchanged.  He is follows commands on the right side but remains plegic on the left.  Vital signs are stable.  Continues to be on trach collar tolerating well  Vitals:   10/10/21 0728 10/10/21 0751 10/10/21 1137 10/10/21 1139  BP: (!) 150/69   (!) 143/67  Pulse: 93 97 85 80  Resp: (!) 29 (!) 30 20 (!) 22  Temp: 98.8 F (37.1 C)   99.5 F (37.5 C)  TempSrc: Axillary   Oral  SpO2:  91% 98%   Weight:      Height:       CBC:  Recent Labs  Lab 10/09/21 0031 10/10/21 0103  WBC 15.7* 11.4*  NEUTROABS 12.0* 8.0*  HGB 11.0* 9.9*  HCT 33.9* 30.7*  MCV 92.1 93.3  PLT 539* [id number]*   Basic Metabolic Panel:  Recent Labs  Lab 10/09/21 0031 10/10/21 0103  NA 137 140  K 3.7 3.4*  CL 103 103  CO2 25 27  GLUCOSE 141* 138*  BUN 25* 20  CREATININE 0.92 0.86  CALCIUM 8.9 8.8*  MG 2.5*  --    Lipid Panel:  No results for input(s): CHOL, TRIG, HDL, CHOLHDL, VLDL, LDLCALC in the last 168 hours.  HgbA1c: No results for input(s): HGBA1C in the last 168 hours.  Urine Drug Screen:  No results for input(s): LABOPIA, COCAINSCRNUR, LABBENZ, AMPHETMU, THCU, LABBARB in the last 168 hours.   Alcohol Level No results for input(s): ETH in the last 168 hours.  IMAGING past 24 hours No results found.  PHYSICAL EXAM  Physical Exam  Constitutional: Obese middle-aged Caucasian male not in distress.  Status post tracheostomy Cardiovascular: Normal rate and regular rhythm.  Respiratory: Respirations even and unlabored on trach collar  Neuro: Awake and interactive.  Eyes closed, does open on voice, tracking to the right.  Left facial droop. Spontaneous movement of right UE and LE with purpose. Will follow commands with right hand, no movement of LUE and LLE with hypotonia and dense left hemiplegia.   ASSESSMENT/PLAN [name] is a 61 y.o. male with history of AFib on Coumadin s/p recent conversion, HTN, HLD, remote tobacco abuse, Cataract OU, glaucoma, obesity, CVA, DM II, and OSA who initially presented to the ED via EMS left sided weakness, dysarthria, and right fixed gaze. Coumadin reversed. Labetalol and cleviprex used for BP greater than 140 during CT. Patient was then intubated due to somnolence and inability to protect his airway. CT and MRI show stable right thalamic hemorrhage with intraventricular extension. ICH score 2. Febrile. Hypertonic saline discontinued 1/23. Most recent sodium is 155.  Currently in NSR with flecainide continued. Holding home antihypertensive medications currently. No longer using cleviprex or norepinephrine (r/t hypotension after RSI) for BP control. Repeat CT shows no significant change in the hemorrhage or ventricle size. Weaning 1/28 and 1/29 at 30% , 5/5. Extubated 1/29 AM and reintubated 1/29 PM due to copious secretions.  Tracheostomy was placed 1/31. PEG tube placed 2/3. He remains on trach collar and is awaiting placement for rehab/LTACH.   Stroke:  Acute right thalamic ICH and IVH likely secondary to hypertension in the setting of coumadin use Code stroke CT - Positive for acute right thalamic hemorrhage with intraventricular extension. Estimated intra-axial blood volume 25 mL. Mild Regional brain edema. Trace leftward midline shift. 1/22  Repeat CT shows no changes from code stroke CT MRI  Stable right thalamic hemorrhage since presentation. Regional edema, tracking into the midbrain. 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 No increased intracranial mass effect. MRA  Abundant chronic micro-hemorrhages in the brain superimposed on chronic lacunar infarcts compatible with advanced small vessel disease CT head 1/27 - unchanged hematoma and ventricular size Carotid Doppler  1-39% stenosis in Left and Right ICA 2D Echo EF 60-65% LDL 52 HgbA1c 9.7 VTE prophylaxis  - heparin warfarin daily prior to admission, now on No antithrombotic due to [place] Therapy recommendations:  LTACH- [facility] evaluating patient. CIR signed off. Disposition:  pending  Cerebral Edema with left midline shift Stable MRI and repeat CT scan Hypertonic saline discontinued d/c Na serial checks Allow Na trending down gradually  Free water flushes given 200cc->400cc q4h->200 Q3h  Acute hypoxemic respiratory failure CCM on board Difficult intubation r/t tracheal stenosis Extubated 1/29 and reintubated Tracheostomy 1/31 PEG 2/3  Atrial Fibrillation Home meds: Flecainide, coreg, coumadin Rate controlled now on No antithrombotic due to [place] Per Dr. Leonie Man, pt is [facility] candidate. Pending consent once stable  Fever, improved Blood culture 1/2 staph epidermidis Repeat blood culture no growth Tmax 102.4->100.7 -> 100.4->100.6->afebrile WBC 15.3->14.2 -> 15.4->11.1->13.2 -> 14.0 -> 15.7 completed rocephin course  Hypertension Home meds:  Amlodipine, coreg, losartan-hydrochlorothiazide BP goal less than 160 BP on the high end On coreg 25 bid and norvasc 10 Increase hydralazine to 100 Q8 Long term BP goal normotensive  Hyperlipidemia Home meds: Lipitor 80 LDL 52, goal < 70 Consider to resume Lipitor on discharge  Diabetes type II Uncontrolled Home meds:  Metformin, jardiance HgbA1c 9.7 , goal < 7.0 CBGs SSI On levemir Close PCP follow-up as outpatient for better DM control  Dysphagia  PEG 2/3 On TF @ 50 and FW  Other Stroke Risk Factors Former smoker - quit 20 years ago Obesity, Body mass index is 35.16 kg/m., BMI >/= 30 associated with increased stroke risk, recommend weight loss, diet and exercise as appropriate  Obstructive sleep apnea   Hospital day # 20  I have personally obtained history,examined this patient, reviewed notes, independently viewed imaging studies, participated in medical decision making and plan of care.ROS completed by me personally and  pertinent positives fully documented  I have made any additions or clarifications directly to the above note. Agree with note above.  Continue ongoing therapies.  Await decision from insurance about transfer to [facility] for rehab.  Discussed with brother at the bedside and answered questions.  Greater than 50% time during this 35-minute visit was spent in counseling and coordination of care and discussion with patient and care team and answering questions.  Antony Contras, MD Medical Director [place] Pager: [phone] 10/10/2021 3:01 PM   Antony Contras, MD Stroke Neurology 10/10/2021 3:00 PM     To contact Stroke Continuity provider, please refer to [email or web address] After hours, contact General Neurology

## 2021-10-11 DIAGNOSIS — I619 Nontraumatic intracerebral hemorrhage, unspecified: Secondary | ICD-10-CM | POA: Diagnosis not present

## 2021-10-11 DIAGNOSIS — J398 Other specified diseases of upper respiratory tract: Secondary | ICD-10-CM | POA: Diagnosis not present

## 2021-10-11 DIAGNOSIS — I4821 Permanent atrial fibrillation: Secondary | ICD-10-CM | POA: Diagnosis not present

## 2021-10-11 DIAGNOSIS — E1159 Type 2 diabetes mellitus with other circulatory complications: Secondary | ICD-10-CM | POA: Diagnosis not present

## 2021-10-11 DIAGNOSIS — N179 Acute kidney failure, unspecified: Secondary | ICD-10-CM | POA: Diagnosis not present

## 2021-10-11 LAB — CBC WITH DIFFERENTIAL/PLATELET
Abs Immature Granulocytes: 0.1 10*3/uL — ABNORMAL HIGH (ref 0.00–0.07)
Basophils Absolute: 0.1 10*3/uL (ref 0.0–0.1)
Basophils Relative: 0 %
Eosinophils Absolute: 0.3 10*3/uL (ref 0.0–0.5)
Eosinophils Relative: 3 %
HCT: 33.4 % — ABNORMAL LOW (ref 39.0–52.0)
Hemoglobin: 10.7 g/dL — ABNORMAL LOW (ref 13.0–17.0)
Immature Granulocytes: 1 %
Lymphocytes Relative: 10 %
Lymphs Abs: 1.3 10*3/uL (ref 0.7–4.0)
MCH: 29.8 pg (ref 26.0–34.0)
MCHC: 32 g/dL (ref 30.0–36.0)
MCV: 93 fL (ref 80.0–100.0)
Monocytes Absolute: 1.5 10*3/uL — ABNORMAL HIGH (ref 0.1–1.0)
Monocytes Relative: 12 %
Neutro Abs: 8.9 10*3/uL — ABNORMAL HIGH (ref 1.7–7.7)
Neutrophils Relative %: 74 %
Platelets: 445 10*3/uL — ABNORMAL HIGH (ref 150–400)
RBC: 3.59 MIL/uL — ABNORMAL LOW (ref 4.22–5.81)
RDW: 12.7 % (ref 11.5–15.5)
WBC: 12.1 10*3/uL — ABNORMAL HIGH (ref 4.0–10.5)
nRBC: 0 % (ref 0.0–0.2)

## 2021-10-11 LAB — GLUCOSE, CAPILLARY
Glucose-Capillary: 114 mg/dL — ABNORMAL HIGH (ref 70–99)
Glucose-Capillary: 134 mg/dL — ABNORMAL HIGH (ref 70–99)
Glucose-Capillary: 155 mg/dL — ABNORMAL HIGH (ref 70–99)
Glucose-Capillary: 211 mg/dL — ABNORMAL HIGH (ref 70–99)
Glucose-Capillary: 220 mg/dL — ABNORMAL HIGH (ref 70–99)

## 2021-10-11 LAB — BASIC METABOLIC PANEL
Anion gap: 9 (ref 5–15)
BUN: 23 mg/dL — ABNORMAL HIGH (ref 6–20)
CO2: 27 mmol/L (ref 22–32)
Calcium: 9 mg/dL (ref 8.9–10.3)
Chloride: 108 mmol/L (ref 98–111)
Creatinine, Ser: 0.92 mg/dL (ref 0.61–1.24)
GFR, Estimated: >60 mL/min (ref >60)
Glucose, Bld: 158 mg/dL — ABNORMAL HIGH (ref 70–99)
Potassium: 3.7 mmol/L (ref 3.5–5.1)
Sodium: 144 mmol/L (ref 135–145)

## 2021-10-11 NOTE — Progress Notes (Addendum)
STROKE TEAM PROGRESS NOTE   INTERVAL HISTORY Patient is seen in his room with no family at the bedside.  He has been hemodynamically stable and his neurological exam is unchanged.  He is follows commands on the right side but remains plegic on the left.  Vital signs are stable.  Continues to be on trach collar tolerating well.  Lab work from today CBC and BMP are unremarkable.  Awaiting decision on insurance approval for LTAC  Vitals:   10/11/21 0510 10/11/21 0756 10/11/21 0813 10/11/21 1156  BP:  (!) 153/67  (!) 107/56  Pulse: 92 94 93 79  Resp: (!) 28 (!) 22 (!) 29 (!) 21  Temp:  98.2 F (36.8 C)  98.7 F (37.1 C)  TempSrc:  Oral  Oral  SpO2: 95% 91% 91% 95%  Weight:      Height:       CBC:  Recent Labs  Lab 10/10/21 0103 10/11/21 0245  WBC 11.4* 12.1*  NEUTROABS 8.0* 8.9*  HGB 9.9* 10.7*  HCT 30.7* 33.4*  MCV 93.3 93.0  PLT 451* 445*    Basic Metabolic Panel:  Recent Labs  Lab 10/09/21 0031 10/10/21 0103 10/11/21 0245  NA 137 140 144  K 3.7 3.4* 3.7  CL 103 103 108  CO2 25 27 27   GLUCOSE 141* 138* 158*  BUN 25* 20 23*  CREATININE 0.92 0.86 0.92  CALCIUM 8.9 8.8* 9.0  MG 2.5*  --   --     Lipid Panel:  No results for input(s): CHOL, TRIG, HDL, CHOLHDL, VLDL, LDLCALC in the last 168 hours.  HgbA1c: No results for input(s): HGBA1C in the last 168 hours.  Urine Drug Screen:  No results for input(s): LABOPIA, COCAINSCRNUR, LABBENZ, AMPHETMU, THCU, LABBARB in the last 168 hours.   Alcohol Level No results for input(s): ETH in the last 168 hours.  IMAGING past 24 hours No results found.  PHYSICAL EXAM  Physical Exam  Constitutional: Obese middle-aged Caucasian male not in distress.  Status post tracheostomy Cardiovascular: Normal rate and regular rhythm.  Respiratory: Respirations even and unlabored on trach collar  Neuro: Awake and interactive.  Eyes closed, does open on voice, tracking to the right.  Left facial droop. Spontaneous movement of right  UE and LE with purpose. Will follow commands with right hand, no movement of LUE and LLE with hypotonia and dense left hemiplegia.   ASSESSMENT/PLAN [name] is a 61 y.o. male with history of AFib on Coumadin s/p recent conversion, HTN, HLD, remote tobacco abuse, Cataract OU, glaucoma, obesity, CVA, DM II, and OSA who initially presented to the ED via EMS left sided weakness, dysarthria, and right fixed gaze. Coumadin reversed. Labetalol and cleviprex used for BP greater than 140 during CT. Patient was then intubated due to somnolence and inability to protect his airway. CT and MRI show stable right thalamic hemorrhage with intraventricular extension. ICH score 2. Febrile. Hypertonic saline discontinued 1/23. Most recent sodium is 155.  Currently in NSR with flecainide continued. Holding home antihypertensive medications currently. No longer using cleviprex or norepinephrine (r/t hypotension after RSI) for BP control. Repeat CT shows no significant change in the hemorrhage or ventricle size. Weaning 1/28 and 1/29 at 30% , 5/5. Extubated 1/29 AM and reintubated 1/29 PM due to copious secretions.  Tracheostomy was placed 1/31. PEG tube placed 2/3. He remains on trach collar and is awaiting placement for rehab/LTACH.   Stroke:  Acute right thalamic ICH and IVH likely secondary to hypertension  in the setting of coumadin use Code stroke CT - Positive for acute right thalamic hemorrhage with intraventricular extension. Estimated intra-axial blood volume 25 mL. Mild Regional brain edema. Trace leftward midline shift. 1/22 Repeat CT shows no changes from code stroke CT MRI  Stable right thalamic hemorrhage since presentation. Regional edema, tracking into the midbrain. 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 No increased intracranial mass effect. MRA  Abundant chronic micro-hemorrhages in the brain superimposed on chronic lacunar infarcts compatible with advanced small vessel disease CT  head 1/27 - unchanged hematoma and ventricular size Carotid Doppler  1-39% stenosis in Left and Right ICA 2D Echo EF 60-65% LDL 52 HgbA1c 9.7 VTE prophylaxis - heparin warfarin daily prior to admission, now on No antithrombotic due to [place] Therapy recommendations:  LTACH- [facility] evaluating patient. CIR signed off. Disposition:  pending  Cerebral Edema with left midline shift Stable MRI and repeat CT scan Hypertonic saline discontinued d/c Na serial checks Allow Na trending down gradually  Free water flushes given 200cc->400cc q4h->200 Q3h  Acute hypoxemic respiratory failure CCM on board Difficult intubation r/t tracheal stenosis Extubated 1/29 and reintubated Tracheostomy 1/31 PEG 2/3  Atrial Fibrillation Home meds: Flecainide, coreg, coumadin Rate controlled now on No antithrombotic due to [place] Per Dr. Leonie Man, pt is ASPIRE candidate. Pending consent once stable  Fever, improved Blood culture 1/2 staph epidermidis Repeat blood culture no growth Tmax 102.4->100.7 -> 100.4->100.6->afebrile WBC 11.4-> 12.1 completed rocephin course  Hypertension Home meds:  Amlodipine, coreg, losartan-hydrochlorothiazide BP goal less than 160 BP on the high end On coreg 25 bid and norvasc 10 Increase hydralazine to 100 Q8 Long term BP goal normotensive  Hyperlipidemia Home meds: Lipitor 80 LDL 52, goal < 70 Consider to resume Lipitor on discharge  Diabetes type II Uncontrolled Home meds:  Metformin, jardiance HgbA1c 9.7 , goal < 7.0 CBGs SSI On levemir Close PCP follow-up as outpatient for better DM control  Dysphagia  PEG 2/3 On TF @ 50 and FW  Other Stroke Risk Factors Former smoker - quit 20 years ago Obesity, Body mass index is 35.16 kg/m., BMI >/= 30 associated with increased stroke risk, recommend weight loss, diet and exercise as appropriate  Obstructive sleep apnea   Hospital day # 21  Patient seen and examined by NP/APP with MD. MD to update note as needed.    Janine Ores, DNP, FNP-BC [facility] Pager: [phone]  I have personally obtained history,examined this patient, reviewed notes, independently viewed imaging studies, participated in medical decision making and plan of care.ROS completed by me personally and pertinent positives fully documented  I have made any additions or clarifications directly to the above note. Agree with note above.  Continue ongoing therapies and awaiting insurance decision on approval for LTAC hopefully next few days. I spoke to the patient's son Caleb Vaughan over the phone about possibly interested in participating in the ASPIRE stroke prevention study(Eliquis versus aspirin in patients with A-fib with intracerebral hemorrhage) and he has expressed interest will given the consent form to review and decide and hopefully enroll him in the next few days.  Greater than 50% time during this 35-minute visit was spent on counseling and coordination of care about the sublimation atrial fibrillation stroke prevention discussion and answering questions Antony Contras, MD Medical Director [facility] Pager: [phone] 10/11/2021 1:53 PM    To contact Stroke Continuity provider, please refer to [email or web address] After hours, contact General Neurology

## 2021-10-11 NOTE — Progress Notes (Signed)
°   10/11/21 0756 10/11/21 0813  Assess: MEWS Score  Temp 98.2 F (36.8 C)  --   BP (!) 153/67  --   Pulse Rate 94  --   ECG Heart Rate 95  --   Resp (!) 22  --   Level of Consciousness Alert Alert  SpO2 91 %  --   O2 Device Tracheostomy Collar  --   O2 Flow Rate (L/min) 10 L/min  --   FiO2 (%) 60 %  --   Assess: if the MEWS score is Yellow or Red  Were vital signs taken at a resting state? Yes  --   Focused Assessment No change from prior assessment  --   Early Detection of Sepsis Score *See Row Information* Medium  --   MEWS guidelines implemented *See Row Information* No, previously yellow, continue vital signs every 4 hours  --

## 2021-10-11 NOTE — Progress Notes (Addendum)
PROGRESS NOTE  Caleb Vaughan  TCY:[id number] DOB: 01-Aug-1961 DOA: 09/20/2021 PCP: Pcp, No   Brief Narrative: Patient is a 61 year old male with history of A-fib on Coumadin, hyperlipidemia, hypertension, diabetes mellitus type 2, OSA, prior CVA who presented to the emergency department on 1/12 as code stroke.  He presented with left-sided weakness, dysarthria, right fixed gaze.  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  Neurology consulted.  Patient was intubated in the ED. patient was unable to wean off of mechanical ventilation so he was trached on 09/29/2021, PEG placement done on 10/02/2021.  Transferred to [facility] service on 10/05/2021.  Hospital course remarkable for persistent encephalopathy.  PT/OT recommending CIR but looks like he will not be able to participate with therapy anytime soon so the current plan is SNF vs LTACH.  TOC following   Assessment & Plan: Principal Problem:   Nontraumatic thalamic hemorrhage (HCC) Active Problems:   Respiratory failure with hypoxia (HCC)   Paroxysmal A-fib (HCC)   Obstructive sleep apnea   Type 2 diabetes mellitus ([place])   Hypertension associated with diabetes ([place])   Assessment and Plan:  Acute hemorrhagic stroke: Presented with left-sided weakness, dysarthria, right fixed gaze.  CT imaging showed acute right thalamic intracerebral hemorrhage and intraventricular hemorrhage.  Likely on the background of severe hypertension/concomitant Coumadin use.  MRI of the brain showed a stable right thalamic hemorrhage, regional edema, stable to moderate intraventricular hemorrhage with no ventriculomegaly.  MRA showed chronic lacunar infarcts compatible with advanced small vessel disease.  Carotid Doppler showed 1-39% stenosis on left and right ICA. Echocardiogram showed EF of 62%.  LDL of 52.  Hemoglobin A1c of 9.7. Current plan is SNF with long term care vs LTACH.  TOC  following  Acute hypoxic respiratory failure: Had to be intubated on presentation.  Extubated on 1/29 and had to be reintubated.  Status post trach placement.  On 8 L of oxygen via trach.  PCCM following intermittently  Cerebral edema with left midline shift: Stable as per MRI and follow-up CT scan.  Was initially started on hypertonic saline now discontinued.  Dysphagia: Status post PEG  Chronic A-fib: On flecainide, Coreg, Coumadin at home.  Rate is controlled.  No anticoagulation due to intracerebral hemorrhage.  Currently in normal sinus rhythm  Fever/leucocytosis: Currently afebrile. One of the blood cultures showed Staph epidermidis: Most likely contamination.  Repeat blood cultures have been negative.  Fever was most likely secondary to intracerebral hemorrhage. He completed the course of Rocephin. Tracheal aspirate is showing gram-negative rods, gram-positive cocci in pairs, no staph or Pseudomonas.  Since leukocytosis was worsening,  restarted ceftriaxone.chest x-ray showed mild patchy airspace opacities at the bases potentially atelectasis or infection.Plan for 5 days course. repeat blood cultures sent,NGTD.  Hypertension: On amlodipine, Coreg, losartan-hydrochlorothiazide at home.  Target blood pressures less than 160 mmHg.  Currently on Coreg, Norvasc, hydralazine with increased dose.  Monitor blood pressure  Hyperlipidemia: Takes Lipitor 80 mg at home.  LDL 52.  We will resume Lipitor on discharge  Diabetes type 2: Uncontrolled.  Hemoglobin A1c of 9.7.  On Jardiance, metformin at home.  Currently on sliding scale insulin.  Dysphagia: Status post PEG  Debility/deconditioning: PT/OT recommending CIR , but the plan is for skilled nursing facility with long-term care. prolonged hospitalization  Obesity: BMI of 35.2          Nutrition Problem: Inadequate oral intake Etiology: inability to eat    DVT prophylaxis:enoxaparin (  LOVENOX) injection 40 mg Start: 10/06/21  1000 SCDs Start: 09/20/21 1054   Code Status:   Code Status: Full Code Family Communication:  Patient status:Inpatient DC plan to: Skilled nursing facility for long-term care vs LTACH Expected DC: Not sure.  Prolonged hospitalization  Consultants: Neurology, PCCM  Procedures: Intubation, tracheostomy, PEG placement  Antimicrobials:  Anti-infectives (From admission, onward)    Start     Dose/Rate Route Frequency Ordered Stop   10/09/21 0915  cefTRIAXone (ROCEPHIN) 2 g in sodium chloride 0.9 % 100 mL IVPB        2 g 200 mL/hr over 30 Minutes Intravenous Every 24 hours 10/09/21 0822 10/14/21 0959   09/30/21 2000  cefTRIAXone (ROCEPHIN) 2 g in sodium chloride 0.9 % 100 mL IVPB        2 g 200 mL/hr over 30 Minutes Intravenous Every 24 hours 09/30/21 1830 10/05/21 0653   09/21/21 1200  azithromycin (ZITHROMAX) 250 mg in dextrose 5 % 125 mL IVPB  Status:  Discontinued       See Hyperspace for full Linked Orders Report.   250 mg 127.5 mL/hr over 60 Minutes Intravenous Every 24 hours 09/20/21 1052 09/21/21 0856   09/20/21 1200  cefTRIAXone (ROCEPHIN) 2 g in sodium chloride 0.9 % 100 mL IVPB  Status:  Discontinued        2 g 200 mL/hr over 30 Minutes Intravenous Every 24 hours 09/20/21 1052 09/21/21 0856   09/20/21 1200  azithromycin (ZITHROMAX) 500 mg in sodium chloride 0.9 % 250 mL IVPB       See Hyperspace for full Linked Orders Report.   500 mg 250 mL/hr over 60 Minutes Intravenous  Once 09/20/21 1052 09/20/21 1528       Subjective: Patient seen and examined the bedside this morning.  Hemodynamically stable.  Same as yesterday.  On vent, lying on bed.  Obese, does very well.  Has profound hemiplegia on the left side  Objective: Vitals:   10/10/21 2250 10/10/21 2337 10/11/21 0353 10/11/21 0510  BP: 127/69 (!) 108/57 (!) 143/66   Pulse: 73 78 87 92  Resp: $Remo'19 20 20 'rhEDJ$ (!) 28  Temp:  97.6 F (36.4 C) 98.1 F (36.7 C)   TempSrc:  Axillary Axillary   SpO2: 98% 97% 93% 95%   Weight:      Height:        Intake/Output Summary (Last 24 hours) at 10/11/2021 0758 Last data filed at 10/10/2021 2220 Gross per 24 hour  Intake --  Output 1450 ml  Net -1450 ml   Filed Weights   10/07/21 0500 10/08/21 0500 10/09/21 0105  Weight: 93 kg 93.2 kg 92.9 kg    Examination:  General exam: Extremely deconditioned, lying on bed, obese HEENT: Trach Respiratory system: Diminished air sounds on bases, no wheezes or crackles  Cardiovascular system: S1 & S2 heard, RRR.  Gastrointestinal system: Abdomen is nondistended, soft and nontender.  PEG Central nervous system: Alert and awake, obeys commands, dense hemiplegia in the left Extremities: No edema, no clubbing ,no cyanosis Skin: No rashes, no ulcers,no icterus    Data Reviewed: I have personally reviewed following labs and imaging studies  CBC: Recent Labs  Lab 10/07/21 0219 10/08/21 0217 10/09/21 0031 10/10/21 0103 10/11/21 0245  WBC 13.2* 14.0* 15.7* 11.4* 12.1*  NEUTROABS 9.6* 10.4* 12.0* 8.0* 8.9*  HGB 11.0* 11.0* 11.0* 9.9* 10.7*  HCT 33.5* 32.6* 33.9* 30.7* 33.4*  MCV 92.0 90.8 92.1 93.3 93.0  PLT 465* 489* 539* 451* 445*  Basic Metabolic Panel: Recent Labs  Lab 10/06/21 0213 10/07/21 0219 10/09/21 0031 10/10/21 0103 10/11/21 0245  NA 139 135 137 140 144  K 3.8 3.9 3.7 3.4* 3.7  CL 105 101 103 103 108  CO2 $Re'26 24 25 27 27  'FZd$ GLUCOSE 99 144* 141* 138* 158*  BUN 23* 21* 25* 20 23*  CREATININE 0.81 0.81 0.92 0.86 0.92  CALCIUM 8.9 8.7* 8.9 8.8* 9.0  MG  --   --  2.5*  --   --      Recent Results (from the past 240 hour(s))  Expectorated Sputum Assessment w Gram Stain, Rflx to Resp Cult     Status: None   Collection Time: 10/05/21  1:45 PM   Specimen: Expectorated Sputum  Result Value Ref Range Status   Specimen Description EXPECTORATED SPUTUM  Final   Special Requests NONE  Final   Sputum evaluation   Final    THIS SPECIMEN IS ACCEPTABLE FOR SPUTUM CULTURE Performed at [facility], [place] [address] [place], [place] [address]    Report Status 10/07/2021 FINAL  Final  Culture, Respiratory w Gram Stain     Status: None   Collection Time: 10/05/21  1:45 PM  Result Value Ref Range Status   Specimen Description EXPECTORATED SPUTUM  Final   Special Requests NONE Reflexed from [id number]  Final   Gram Stain   Final    ABUNDANT WBC PRESENT, PREDOMINANTLY PMN FEW GRAM NEGATIVE RODS FEW GRAM POSITIVE COCCI IN PAIRS    Culture   Final    RARE Normal respiratory flora-no Staph aureus or Pseudomonas seen Performed at [facility], [place] [address], [place], [place] [address]    Report Status 10/08/2021 FINAL  Final  Culture, blood (routine x 2)     Status: None (Preliminary result)   Collection Time: 10/09/21  9:21 AM   Specimen: BLOOD  Result Value Ref Range Status   Specimen Description BLOOD BLOOD LEFT HAND  Final   Special Requests   Final    BOTTLES DRAWN AEROBIC AND ANAEROBIC Blood Culture adequate volume   Culture   Final    NO GROWTH < 24 HOURS Performed at [facility], [place] [address], [place], [place] [address]    Report Status PENDING  Incomplete  Culture, blood (routine x 2)     Status: None (Preliminary result)   Collection Time: 10/09/21  9:22 AM   Specimen: BLOOD  Result Value Ref Range Status   Specimen Description BLOOD BLOOD RIGHT HAND  Final   Special Requests AEROBIC BOTTLE ONLY Blood Culture adequate volume  Final   Culture   Final    NO GROWTH < 24 HOURS Performed at [facility], [place] [address] [place],  [address]    Report Status PENDING  Incomplete     Radiology Studies: DG CHEST PORT 1 VIEW  Result Date: 10/09/2021 CLINICAL DATA:  A 61 year old male presents for evaluation of leukocytosis. EXAM: PORTABLE CHEST 1 VIEW COMPARISON:  October 06, 2021. FINDINGS: Tracheostomy tube is in place with similar appearance, tip between clavicular heads in the mid trachea. Cardiomediastinal contours and hilar structures  are stable with accentuation of cardiac silhouette, likely with mild enlargement due to portable technique and patient with AP projection. No signs of lobar consolidation. Linear opacity in the RIGHT mid chest is unchanged since the most recent prior, new since January of 2023. Subtle patchy opacities at the LEFT base as well not substantially  changed accounting for technique differences on the current as compared to the prior study. On limited assessment there is no acute skeletal process. IMPRESSION: Stable exam with appearance of mild patchy airspace opacities at the bases potentially atelectasis or infection. Electronically Signed   By: Zetta Bills M.D.   On: 10/09/2021 14:04    Scheduled Meds:  amLODipine  10 mg Per Tube Daily   bethanechol  25 mg Per Tube TID   carvedilol  25 mg Per Tube BID WC   chlorhexidine gluconate (MEDLINE KIT)  15 mL Mouth Rinse BID   Chlorhexidine Gluconate Cloth  6 each Topical Q0600   enoxaparin (LOVENOX) injection  40 mg Subcutaneous Q24H   feeding supplement (PROSource TF)  90 mL Per Tube BID   fiber  1 packet Per Tube TID   flecainide  150 mg Per Tube BID   free water  200 mL Per Tube Q3H   Gerhardt's butt cream   Topical BID   hydrALAZINE  100 mg Per Tube Q8H   insulin aspart  0-20 Units Subcutaneous Q4H   insulin aspart  5 Units Subcutaneous Q4H   insulin detemir  27 Units Subcutaneous BID   mouth rinse  15 mL Mouth Rinse 10 times per day   oxyCODONE  5 mg Per Tube TID   pantoprazole sodium  40 mg Per Tube QHS   scopolamine  1 patch Transdermal Q72H   Continuous Infusions:  cefTRIAXone (ROCEPHIN)  IV 2 g (10/10/21 0940)   feeding supplement (JEVITY 1.5 CAL/FIBER) 1,000 mL (10/10/21 1435)     LOS: 21 days   Shelly Coss, MD [facility] P2/07/2022, 7:58 AM

## 2021-10-12 ENCOUNTER — Inpatient Hospital Stay (HOSPITAL_COMMUNITY): Payer: No Typology Code available for payment source

## 2021-10-12 DIAGNOSIS — I619 Nontraumatic intracerebral hemorrhage, unspecified: Secondary | ICD-10-CM | POA: Diagnosis not present

## 2021-10-12 DIAGNOSIS — N179 Acute kidney failure, unspecified: Secondary | ICD-10-CM | POA: Diagnosis not present

## 2021-10-12 DIAGNOSIS — J398 Other specified diseases of upper respiratory tract: Secondary | ICD-10-CM | POA: Diagnosis not present

## 2021-10-12 DIAGNOSIS — E1159 Type 2 diabetes mellitus with other circulatory complications: Secondary | ICD-10-CM | POA: Diagnosis not present

## 2021-10-12 DIAGNOSIS — I4821 Permanent atrial fibrillation: Secondary | ICD-10-CM | POA: Diagnosis not present

## 2021-10-12 HISTORY — PX: IR REPLC GASTRO/COLONIC TUBE PERCUT W/FLUORO: IMG2333

## 2021-10-12 LAB — GLUCOSE, CAPILLARY
Glucose-Capillary: 110 mg/dL — ABNORMAL HIGH (ref 70–99)
Glucose-Capillary: 139 mg/dL — ABNORMAL HIGH (ref 70–99)
Glucose-Capillary: 146 mg/dL — ABNORMAL HIGH (ref 70–99)
Glucose-Capillary: 150 mg/dL — ABNORMAL HIGH (ref 70–99)
Glucose-Capillary: 179 mg/dL — ABNORMAL HIGH (ref 70–99)
Glucose-Capillary: 223 mg/dL — ABNORMAL HIGH (ref 70–99)
Glucose-Capillary: 68 mg/dL — ABNORMAL LOW (ref 70–99)

## 2021-10-12 MED ORDER — DEXTROSE 50 % IV SOLN
INTRAVENOUS | Status: AC
  Administered 2021-10-12: 50 mL
  Filled 2021-10-12: qty 50

## 2021-10-12 MED ORDER — OLANZAPINE 2.5 MG PO TABS: 5.0000 mg | ORAL_TABLET | Freq: Every day | ORAL | Status: DC

## 2021-10-12 MED ORDER — IOHEXOL 300 MG/ML  SOLN
100.0000 mL | Freq: Once | INTRAMUSCULAR | Status: AC | PRN
  Administered 2021-10-12: 10 mL

## 2021-10-12 MED ORDER — LIDOCAINE VISCOUS HCL 2 % MT SOLN
OROMUCOSAL | Status: AC
  Filled 2021-10-12: qty 15

## 2021-10-12 MED ORDER — HALOPERIDOL LACTATE 5 MG/ML IJ SOLN: 5.0000 mg | Freq: Four times a day (QID) | INTRAMUSCULAR | Status: DC | PRN

## 2021-10-12 MED ORDER — QUETIAPINE FUMARATE 50 MG PO TABS
50.0000 mg | ORAL_TABLET | Freq: Every day | ORAL | Status: DC
  Administered 2021-10-12 – 2021-10-15 (×4): 50 mg
  Filled 2021-10-12 (×5): qty 1

## 2021-10-12 NOTE — Progress Notes (Addendum)
NAME:  Caleb Vaughan, MRN:  [id number], DOB:  1960-12-23, LOS: 70 ADMISSION DATE:  09/20/2021, CONSULTATION DATE:  09/20/2021 REFERRING MD:  Dr. Carren Rang, CHIEF COMPLAINT:  Hemorrhagic stroke    History of Present Illness:  Caleb Vaughan is a 61 y.o. male with PMH listed below  who presented to the emergency department 1/22 as a code stroke, last known normal 2 AM.   Stroke CT on admit revealed, positive for acute right thalamic hemorrhage with intraventricular extension with estimated intra-axial blood volume 25 mL. Mild Regional brain edema. Trace leftward midline shift. Patient was intubated in ED and PCCM was consulted for further assessment.   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 Intubated in ED for increasing somnolence and inability to protect airway.  1/24 increased respiratory distress requiring increased sedation and intermittent neuromuscular blockade.  Improved with diuresis.  3% saline stopped for increasing sodium above 159 1/29 Continue to tolerated wean but gets agitated with decreased sedation, failed extubation 2/2 secretions, foley placed for urinary retention, tmax 102 1/30 PICC removed, increased bethanechol, weaned x 12 hrs  1/31 trach  2/1 trach collar for a few hours, back on vent after PT. Started rocephin for fever + WBC 14 (?UTI)  2/6 Followed up in floor for trach management   Images  1/23 MRI >  Stable right thalamic hemorrhage since presentation. Regional edema, tracking into the midbrain. 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 No increased intracranial mass effect. 1/23 MRA>  Abundant chronic micro-hemorrhages in the brain  superimposed on chronic lacunar infarcts compatible with advanced small vessel disease 1/23 TTE> LVEF 60-65, no wall abnormalities, normal RV 1/27 CT head: No progression r thalamocapsular hematoma with intraventricular extension. No hydrocephalus, or mass effect   Interim History / Subjective:  No acute issues  Objective   Blood pressure (Abnormal) 159/79, pulse 91, temperature 98.6 F (37 C), temperature source Oral, resp. rate (Abnormal) 28, height 5\' 4"  (1.626 m), weight 92.9 kg, SpO2 94 %.    FiO2 (%):  [60 %] 60 %   Intake/Output Summary (Last 24 hours) at 10/12/2021 1152 Last data filed at 10/12/2021 0156 Gross per 24 hour  Intake no documentation  Output 2125 ml  Net -2125 ml   Filed Weights   10/07/21 0500 10/08/21 0500 10/09/21 0105  Weight: 93 kg 93.2 kg 92.9 kg   Examination: General 61 year old male resting in bed no distress HENT NCAT 6 prox trach is unremarkable  Pulm clear  Card rrr Abd soft  Ext warm  Neuro awake gives thumbs up to all questions. 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  May not be candidate for decannulation   Plan Change to 6 proximal cuffless Cont routine trach care We will see again 2/20   Best Practice (right click and "Reselect all SmartList Selections" daily)  Per primary   Erick Colace ACNP-BC [place] Pager # [phone] OR # [phone] if no answer

## 2021-10-12 NOTE — Progress Notes (Signed)
Cortrak Tube Team Note:  Consult received to place a Cortrak feeding tube as pt had pulled out PEG. Discussed pt with RN in detail who reports that after discussions with MD, plan is to hold off on cortrak as pt to have PEG replaced today. Please re-consult the Cortrak team at [email or web address] (password [email or web address]) if plan of care changes and pt needs Cortrak placed in the interim of waiting on PEG. If after hours and replacement cannot be delayed, place a NG tube and confirm placement with an abdominal x-ray.     Rae Lips., MS, RD, LDN (she/her/hers) RD pager number and weekend/on-call pager number located in Amion.

## 2021-10-12 NOTE — Progress Notes (Signed)
Foely catheter 14 Fr inserted through the Peg tube insertion site after patient pulled out his PEG tube.  Balllon at the foley inflated.  Pt tolerated well and abdominal binder applied.

## 2021-10-12 NOTE — TOC Progression Note (Signed)
Transition of Care [facility]) - Progression Note    Patient Details  Name: Caleb Vaughan MRN: [id number] Date of Birth: 1961/04/03  [facility] of [facility]) CM/SW Contact  Kermit Balo, RN Phone Number: 10/12/2021, 4:31 PM  Clinical Narrative:    CM has spoken with [facility] today and they will begin insurance auth for an admission.  TOC following.   Expected Discharge Plan:  (TBD)    Expected Discharge Plan and Services Expected Discharge Plan:  (TBD)   Discharge Planning Services: CM Consult   Living arrangements for the past 2 months: Single Family Home                                       Social Determinants of Health (SDOH) Interventions    Readmission Risk Interventions No flowsheet data found.

## 2021-10-12 NOTE — Progress Notes (Signed)
Pt not available to do routine trach check. RT will assess pt at next scheduled time.

## 2021-10-12 NOTE — Progress Notes (Signed)
Physical Therapy Treatment Patient Details Name: Caleb Vaughan MRN: [id number] DOB: 1960-10-16 Today's Date: 10/12/2021   History of Present Illness Patient is a 61 y/o male admitted due to R thalamic hemorrhage, edema and trace leftward midline shift, intubated on admission, attempted extubation 1/29, re-intubated that evening.  Trach 1/31. Pt is s/p peg tube on 2/3. PMH positive for a-fib on coumadin, HTN, HLD, previous smoker, prior CVA, DM, OSA.    PT Comments    Focus of session today bed mobility and seated balance. The patient tolerated well.  Pt. Shows overall improvement with cognition, processing, communication, and ability to functionally use R extremities. He was able to minimally help with transfers, shows improved ROM and control of both extremities, and write to communicate needs during session.  L hemiparesis, L sided neglect, decreased knowledge of deficits, decreased sitting balance, and processing limitations are still limiting function. Pt. Would benefit from skilled PT to continue to address these deficits, continued R extremity control, functional transfers, and balance as appropriate. Plan and discharge setting remains unchanged. Pt to follow acutely as appropriate.      Recommendations for follow up therapy are one component of a multi-disciplinary discharge planning process, led by the attending physician.  Recommendations may be updated based on patient status, additional functional criteria and insurance authorization.  Follow Up Recommendations  PT at Long-term acute care hospital     Assistance Recommended at Discharge Frequent or constant Supervision/Assistance  Patient can return home with the following Two people to help with walking and/or transfers;Two people to help with bathing/dressing/bathroom;Assistance with cooking/housework;Assistance with feeding;Direct supervision/assist for medications management;Direct supervision/assist for financial  management;Assist for transportation;Help with stairs or ramp for entrance   Equipment Recommendations  Wheelchair (measurements PT);Wheelchair cushion (measurements PT);Hospital bed    Recommendations for Other Services Rehab consult     Precautions / Restrictions Precautions Precautions: Fall Precaution Comments: trach 10L/60%, FiO2,  R wrist restraint, peg tube recently inappropriately removed by patient.     Mobility  Bed Mobility Overal bed mobility: Needs Assistance Bed Mobility: Rolling, Sidelying to Sit Rolling: Max assist, +2 for physical assistance Sidelying to sit: Max assist, +2 for physical assistance   Sit to supine: Total assist, +2 for physical assistance   General bed mobility comments: max +2 for roll bilat for trunk and LE translation (pt. able to assit with UE on rail when placed), BM cleanup, trunk elevation and LE lowering over EOB to transition to EOB sitting.    Transfers Overall transfer level: Needs assistance     Sit to Stand: Total assist, +2 physical assistance          Lateral/Scoot Transfers: +2 physical assistance, Max assist General transfer comment: max +2 for STS attmept and lateral scooting, pt able to assist min with hand on rail or on therapist arm.    Ambulation/Gait                   Stairs             Wheelchair Mobility    Modified Rankin (Stroke Patients Only) Modified Rankin (Stroke Patients Only) Pre-Morbid Rankin Score: No symptoms Modified Rankin: Severe disability     Balance Overall balance assessment: Needs assistance Sitting-balance support: No upper extremity supported, Feet supported Sitting balance-Leahy Scale: Poor Sitting balance - Comments: preference for L pushing, requires frequent redirection to place R UE in lap to prevent pushing, and monitoring of R UE to prevent pt from pulling on lines/leads Postural control: Left  lateral lean, Posterior lean (pushing)                                   Cognition Arousal/Alertness: Awake/alert (drowsy) Behavior During Therapy: Restless Overall Cognitive Status: Impaired/Different from baseline Area of Impairment: Attention, Following commands, Safety/judgement                   Current Attention Level: Sustained   Following Commands: Follows multi-step commands consistently Safety/Judgement: Decreased awareness of safety, Decreased awareness of deficits   Problem Solving: Requires tactile cues, Requires verbal cues, Difficulty sequencing General Comments: Pt able to wave, point in directions and make hand signs, nod head, look around with eyes, mouth words, and write words on paper. Pt. also presents with significant left sided inattention.        Exercises      General Comments        Pertinent Vitals/Pain      Home Living                          Prior Function            PT Goals (current goals can now be found in the care plan section) Acute Rehab PT Goals Patient Stated Goal: rehab post-acute PT Goal Formulation: With patient Time For Goal Achievement: 10/12/21 Potential to Achieve Goals: Fair Progress towards PT goals: Progressing toward goals    Frequency    Min 4X/week      PT Plan Current plan remains appropriate    Co-evaluation PT/OT/SLP Co-Evaluation/Treatment: Yes Reason for Co-Treatment: Complexity of the patient's impairments (multi-system involvement);Necessary to address cognition/behavior during functional activity;For patient/therapist safety;To address functional/ADL transfers PT goals addressed during session: Mobility/safety with mobility;Balance;Strengthening/ROM OT goals addressed during session: ADL's and self-care;Strengthening/ROM      AM-PAC PT "6 Clicks" Mobility   Outcome Measure  Help needed turning from your back to your side while in a flat bed without using bedrails?: A Lot Help needed moving from lying on your back to sitting on  the side of a flat bed without using bedrails?: Total Help needed moving to and from a bed to a chair (including a wheelchair)?: Total Help needed standing up from a chair using your arms (e.g., wheelchair or bedside chair)?: Total Help needed to walk in hospital room?: Total Help needed climbing 3-5 steps with a railing? : Total 6 Click Score: 7    End of Session Equipment Utilized During Treatment: Oxygen Activity Tolerance: Patient tolerated treatment well Patient left: with bed alarm set;with restraints reapplied;with call bell/phone within reach;in bed Nurse Communication: Mobility status;Other (comment) (pt. states need for bowel movement and updated communication) PT Visit Diagnosis: Hemiplegia and hemiparesis;Other abnormalities of gait and mobility (R26.89);Muscle weakness (generalized) (M62.81) Hemiplegia - Right/Left: Left Hemiplegia - dominant/non-dominant: Non-dominant Hemiplegia - caused by: Nontraumatic intracerebral hemorrhage     Time: [id number] PT Time Calculation (min) (ACUTE ONLY): 30 min  Charges:  $Therapeutic Activity: 8-22 mins                     Thermon Leyland, SPT Acute Rehab Services    Thermon Leyland 10/12/2021, 11:37 AM

## 2021-10-12 NOTE — Progress Notes (Signed)
Cbg 68 ,  pt is asymtommatic , IV Dextrose 50%  25 cc given.  Rechecked after 15 min 139

## 2021-10-12 NOTE — Progress Notes (Signed)
Speech Language Pathology Treatment: Dysphagia;Cognitive-Linquistic;Passy Muir Speaking valve  Patient Details Name: Caleb Vaughan MRN: [id number] DOB: 1961-01-17 Today's Date: 10/12/2021 Time: 2376-2831 SLP Time Calculation (min) (ACUTE ONLY): 22 min  Assessment / Plan / Recommendation Clinical Impression  PMSV Pt still continues to have thick secretions, but decreased today as compared to last week.  Pt's tolerance of valve was improved.  There was still no back pressure noted, but pt with increased work of breathing as trial continued. There was no change in vital signs, but pt did not have SpO2 reading during this session. Pt able to achieve phonation, but was unintelligible with verbal phrase.  With valve in place, pt was unable to cough secretions up orally to expectorate.  Valve removed for tracheal expectoration of secretions.  Pt requested that the valve be removed.  SLP to follow for PMSV tolerance.  SWALLOWING Pt with trials of thin liquid (water and coke at pt's request) and nectar thick liquid by spoon today.  PMV speaking valve in place for 2 trials of thin liquid.  There was coughing on 1 of 2 trials.  SLP used suction to assist with clearance. Pt then requested removal of valve. Valve ws not in place for trials of NTL which pt tolerated.  With additional trials of thin liquid by spoon, there were no further clinical s/s of aspiration, suggesting that PMV is aiding in improving pt's sensation when in place.  Recommend MBSS to determine if pt can initiate any PO intake.  Recommend bringing valve for this assessment; although pt cannot wear valve for the length of a meal, perhaps it will impact his swallow function during bolus trials.  COMMUNICATION Pt continues to use thumbs up/down to answer yes/no question and signal agreement/disagreement.  Writing is also now an effective communication strategy.  Pt spontaneously held SLP's hand and traced letters with his fingers on palm. Pt  traced letters of his name at SLP request (request was for pt to speak name during PMV trial, but pt found another means of nonverbal communication).  Pt also wrote TAKE OFF on SLP's palm during PMV trial.  Pt was given clipboard paper and a marker.  He wrote his name on request.  When asked if he needed anything, pt wrote "COKE" which was provided (see above). Letter formation is legible, but with some inaccuracies.  At word level his messages are comprehensible.  Please provide writing utensils and paper (marker and paper left in room with RN) to facilitate communication.    HPI HPI: Pt is a 61 y/o male who presented with left sided weakness, dysarthria, and right fixed gaze. Imaging revealed R thalamic hemorrhage, edema and trace leftward midline shift. Pt intubated in ED for increasing somnolence and concerns regarding airway protection. ETT 1/22-1/29 failed extubation due to secretions; reintubated 1/29-trach 1/31. PMH: A-fib on coumadin, HTN, HLD, previous smoker, prior CVA, DM, OSA.      SLP Plan  Continue with current plan of care;MBS      Recommendations for follow up therapy are one component of a multi-disciplinary discharge planning process, led by the attending physician.  Recommendations may be updated based on patient status, additional functional criteria and insurance authorization.    Recommendations  Diet recommendations: NPO Medication Administration: Via alternative means      Patient may use Passy-Muir Speech Valve: with SLP only PMSV Supervision: Full MD: Please consider changing trach tube to : Smaller size;Cuffless         Follow Up Recommendations: Skilled nursing-short  term rehab (<3 hours/day) (Possible IPR with continued improvement) Assistance recommended at discharge: Frequent or constant Supervision/Assistance SLP Visit Diagnosis: Aphasia (R47.01);Cognitive communication deficit (R41.841);Aphonia (R49.1);Dysphagia, unspecified (R13.10) Plan: Continue with  current plan of care;MBS           Kerrie Pleasure, MA, CCC-SLP Acute Rehabilitation Services Office: [phone]  10/12/2021, 11:41 AM

## 2021-10-12 NOTE — Progress Notes (Signed)
PROGRESS NOTE  Caleb Vaughan  KYH:[id number] DOB: 05/05/1961 DOA: 09/20/2021 PCP: Pcp, No   Brief Narrative: Patient is a 61 year old male with history of A-fib on Coumadin, hyperlipidemia, hypertension, diabetes mellitus type 2, OSA, prior CVA who presented to the emergency department on 1/12 as code stroke.  He presented with left-sided weakness, dysarthria, right fixed gaze.  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  Neurology consulted.  Patient was intubated in the ED. patient was unable to wean off of mechanical ventilation so he was trached on 09/29/2021, PEG placement done on 10/02/2021.  Transferred to [facility] service on 10/05/2021.  Hospital course remarkable for persistent encephalopathy.  PT/OT recommending CIR but looks like he will not be able to participate with therapy anytime soon so the current plan is for [facility].  TOC following   Assessment & Plan: Principal Problem:   Nontraumatic thalamic hemorrhage (HCC) Active Problems:   Respiratory failure with hypoxia (HCC)   Paroxysmal A-fib (HCC)   Obstructive sleep apnea   Type 2 diabetes mellitus ([place])   Hypertension associated with diabetes ([place])   Assessment and Plan:  Acute hemorrhagic stroke: Presented with left-sided weakness, dysarthria, right fixed gaze.  CT imaging showed acute right thalamic intracerebral hemorrhage and intraventricular hemorrhage.  Likely on the background of severe hypertension/concomitant Coumadin use.  MRI of the brain showed a stable right thalamic hemorrhage, regional edema, stable to moderate intraventricular hemorrhage with no ventriculomegaly.  MRA showed chronic lacunar infarcts compatible with advanced small vessel disease.  Carotid Doppler showed 1-39% stenosis on left and right ICA. Echocardiogram showed EF of 62%.  LDL of 52.  Hemoglobin A1c of 9.7. Current plan is SNF with long term care vs LTACH.  TOC  following. Follows commands, intermittently agitated.  Started on Seroquel 50 mg at bedtime  Acute hypoxic respiratory failure: Had to be intubated on presentation.  Extubated on 1/29 and had to be reintubated.  Status post trach placement.  On 8-10 L of oxygen via trach.  PCCM following intermittently  Cerebral edema with left midline shift: Stable as per MRI and follow-up CT scan.  Was initially started on hypertonic saline now discontinued.  Dysphagia: Status post PEG  Chronic A-fib: On flecainide, Coreg, Coumadin at home.  Rate is controlled.  No anticoagulation due to intracerebral hemorrhage.  Currently in normal sinus rhythm  Fever/leucocytosis: Currently afebrile. One of the blood cultures showed Staph epidermidis: Most likely contamination.  Repeat blood cultures have been negative.  Fever was most likely secondary to intracerebral hemorrhage. He completed the course of Rocephin. Tracheal aspirate is showing gram-negative rods, gram-positive cocci in pairs, no staph or Pseudomonas.  Since leukocytosis was worsening,  restarted ceftriaxone.chest x-ray showed mild patchy airspace opacities at the bases potentially atelectasis or infection.Plan for 5 days course. repeat blood cultures sent,NGTD.  Hypertension: On amlodipine, Coreg, losartan-hydrochlorothiazide at home.  Target blood pressures less than 160 mmHg.  Currently on Coreg, Norvasc, hydralazine with increased dose.  Monitor blood pressure  Hyperlipidemia: Takes Lipitor 80 mg at home.  LDL 52.  We will resume Lipitor on discharge  Diabetes type 2: Uncontrolled.  Hemoglobin A1c of 9.7.  On Jardiance, metformin at home.  Currently on sliding scale insulin.  Dysphagia: Status post PEG.  He pulled out his PEG this morning.  IR consulted again to put it back  Debility/deconditioning: Plan for LTACH  Obesity: BMI of 35.2          Nutrition  Problem: Inadequate oral intake Etiology: inability to eat    DVT  prophylaxis:enoxaparin (LOVENOX) injection 40 mg Start: 10/06/21 1000 SCDs Start: 09/20/21 1054   Code Status:   Code Status: Full Code Family Communication:  Patient status:Inpatient DC plan to: Skilled nursing facility for long-term care vs LTACH Expected DC: Not sure.  Prolonged hospitalization  Consultants: Neurology, PCCM  Procedures: Intubation, tracheostomy, PEG placement  Antimicrobials:  Anti-infectives (From admission, onward)    Start     Dose/Rate Route Frequency Ordered Stop   10/09/21 0915  cefTRIAXone (ROCEPHIN) 2 g in sodium chloride 0.9 % 100 mL IVPB        2 g 200 mL/hr over 30 Minutes Intravenous Every 24 hours 10/09/21 0822 10/14/21 0959   09/30/21 2000  cefTRIAXone (ROCEPHIN) 2 g in sodium chloride 0.9 % 100 mL IVPB        2 g 200 mL/hr over 30 Minutes Intravenous Every 24 hours 09/30/21 1830 10/05/21 0653   09/21/21 1200  azithromycin (ZITHROMAX) 250 mg in dextrose 5 % 125 mL IVPB  Status:  Discontinued       See Hyperspace for full Linked Orders Report.   250 mg 127.5 mL/hr over 60 Minutes Intravenous Every 24 hours 09/20/21 1052 09/21/21 0856   09/20/21 1200  cefTRIAXone (ROCEPHIN) 2 g in sodium chloride 0.9 % 100 mL IVPB  Status:  Discontinued        2 g 200 mL/hr over 30 Minutes Intravenous Every 24 hours 09/20/21 1052 09/21/21 0856   09/20/21 1200  azithromycin (ZITHROMAX) 500 mg in sodium chloride 0.9 % 250 mL IVPB       See Hyperspace for full Linked Orders Report.   500 mg 250 mL/hr over 60 Minutes Intravenous  Once 09/20/21 1052 09/20/21 1528       Subjective: Patient seen and examined at the bedside this morning.  Hemodynamically stable.  Got reported that he pulled the PEG tube this morning.  He looks overall comfortable, mildly agitated  Objective: Vitals:   10/12/21 0318 10/12/21 0359 10/12/21 0730 10/12/21 0825  BP: (!) 155/69 (!) 157/71 (!) 159/79   Pulse: 93 92 90 82  Resp: (!) 24  (!) 24 18  Temp: 98.1 F (36.7 C)  98.6 F (37 C)    TempSrc: Axillary  Oral   SpO2: 91% 96%  95%  Weight:      Height:        Intake/Output Summary (Last 24 hours) at 10/12/2021 1141 Last data filed at 10/12/2021 0156 Gross per 24 hour  Intake --  Output 2125 ml  Net -2125 ml   Filed Weights   10/07/21 0500 10/08/21 0500 10/09/21 0105  Weight: 93 kg 93.2 kg 92.9 kg    Examination:  General exam: Extremely deconditioned, lying in bed, obese HEENT: Trach Respiratory system:  no wheezes or crackles  Cardiovascular system: S1 & S2 heard, RRR.  Gastrointestinal system: Abdomen is nondistended, soft and nontender.  PEG Central nervous system: Alert and awake, obeys commands, hemiplegia  on the left Extremities: No edema, no clubbing ,no cyanosis Skin: No rashes, no ulcers,no icterus    Data Reviewed: I have personally reviewed following labs and imaging studies  CBC: Recent Labs  Lab 10/07/21 0219 10/08/21 0217 10/09/21 0031 10/10/21 0103 10/11/21 0245  WBC 13.2* 14.0* 15.7* 11.4* 12.1*  NEUTROABS 9.6* 10.4* 12.0* 8.0* 8.9*  HGB 11.0* 11.0* 11.0* 9.9* 10.7*  HCT 33.5* 32.6* 33.9* 30.7* 33.4*  MCV 92.0 90.8 92.1 93.3 93.0  PLT 465* 489* 539* 451* 510*   Basic Metabolic Panel: Recent Labs  Lab 10/06/21 0213 10/07/21 0219 10/09/21 0031 10/10/21 0103 10/11/21 0245  NA 139 135 137 140 144  K 3.8 3.9 3.7 3.4* 3.7  CL 105 101 103 103 108  CO2 _0 GLUCOSE 99 144* 141* 138* 158*  BUN 23* 21* 25* 20 23*  CREATININE 0.81 0.81 0.92 0.86 0.92  CALCIUM 8.9 8.7* 8.9 8.8* 9.0  MG  --   --  2.5*  --   --      Recent Results (from the past 240 hour(s))  Expectorated Sputum Assessment w Gram Stain, Rflx to Resp Cult     Status: None   Collection Time: 10/05/21  1:45 PM   Specimen: Expectorated Sputum  Result Value Ref Range Status   Specimen Description EXPECTORATED SPUTUM  Final   Special Requests NONE  Final   Sputum evaluation   Final    THIS SPECIMEN IS ACCEPTABLE FOR SPUTUM CULTURE Performed at [facility], [place] [address] [place], [place] [address]    Report Status 10/07/2021 FINAL  Final  Culture, Respiratory w Gram Stain     Status: None   Collection Time: 10/05/21  1:45 PM  Result Value Ref Range Status   Specimen Description EXPECTORATED SPUTUM  Final   Special Requests NONE Reflexed from [id number]  Final   Gram Stain   Final    ABUNDANT WBC PRESENT, PREDOMINANTLY PMN FEW GRAM NEGATIVE RODS FEW GRAM POSITIVE COCCI IN PAIRS    Culture   Final    RARE Normal respiratory flora-no Staph aureus or Pseudomonas seen Performed at [facility], [place] [address], [place], [place] [address]    Report Status 10/08/2021 FINAL  Final  Culture, blood (routine x 2)     Status: None (Preliminary result)   Collection Time: 10/09/21  9:21 AM   Specimen: BLOOD  Result Value Ref Range Status   Specimen Description BLOOD BLOOD LEFT HAND  Final   Special Requests   Final    BOTTLES DRAWN AEROBIC AND ANAEROBIC Blood Culture adequate volume   Culture   Final    NO GROWTH 3 DAYS Performed at [facility], [place] [address] [place], [place] [address]    Report Status PENDING  Incomplete  Culture, blood (routine x 2)     Status: None (Preliminary result)   Collection Time: 10/09/21  9:22 AM   Specimen: BLOOD  Result Value Ref Range Status   Specimen Description BLOOD BLOOD RIGHT HAND  Final   Special Requests AEROBIC BOTTLE ONLY Blood Culture adequate volume  Final   Culture   Final    NO GROWTH 3 DAYS Performed at [facility], [place] [address] [place], [place] [address]    Report Status PENDING  Incomplete     Radiology Studies: No results found.  Scheduled Meds:  amLODipine  10 mg Per Tube Daily   bethanechol  25 mg Per Tube TID   carvedilol  25 mg Per Tube BID WC   chlorhexidine gluconate (MEDLINE KIT)  15 mL Mouth Rinse BID   Chlorhexidine Gluconate Cloth  6 each Topical Q0600   enoxaparin (LOVENOX) injection  40 mg Subcutaneous Q24H   feeding supplement  (PROSource TF)  90 mL Per Tube BID   fiber  1 packet Per Tube TID   flecainide  150 mg Per Tube BID   free water  200  mL Per Tube Q3H   Gerhardt's butt cream   Topical BID   hydrALAZINE  100 mg Per Tube Q8H   insulin aspart  0-20 Units Subcutaneous Q4H   mouth rinse  15 mL Mouth Rinse 10 times per day   oxyCODONE  5 mg Per Tube TID   pantoprazole sodium  40 mg Per Tube QHS   QUEtiapine  50 mg Oral QHS   scopolamine  1 patch Transdermal Q72H   Continuous Infusions:  cefTRIAXone (ROCEPHIN)  IV Stopped (10/12/21 0942)   feeding supplement (JEVITY 1.5 CAL/FIBER) 1,000 mL (10/11/21 1326)     LOS: 22 days   Shelly Coss, MD [facility] P2/13/2023, 11:41 AM

## 2021-10-12 NOTE — Progress Notes (Signed)
Occupational Therapy Treatment Patient Details Name: Pride Novo MRN: [id number] DOB: 1960/09/01 Today's Date: 10/12/2021   History of present illness Patient is a 61 y/o male admitted due to R thalamic hemorrhage, edema and trace leftward midline shift, intubated on admission, attempted extubation 1/29, re-intubated that evening.  Trach 1/31. Pt is s/p peg tube on 2/3. PMH positive for a-fib on coumadin, HTN, HLD, previous smoker, prior CVA, DM, OSA.   OT comments  Patient seen to address bed mobility and EOB sitting balance.  Patient responded when name was called, kept eyes opened, responded to commands. Patient continues to be +2 assist for rolling in bed, getting to EOB, and returning to supine. Patient was assisted with cleaning while in bed following BM.  Patient sat on EOB with pushing with RUE and required frequent cues to keep hand in lap.  Patient declined getting into recliner on this date due to believes he will have another BM.  Patient is making gains with following directions. Acute OT to continue to follow.    Recommendations for follow up therapy are one component of a multi-disciplinary discharge planning process, led by the attending physician.  Recommendations may be updated based on patient status, additional functional criteria and insurance authorization.    Follow Up Recommendations  Acute inpatient rehab (3hours/day)    Assistance Recommended at Discharge Frequent or constant Supervision/Assistance  Patient can return home with the following  Two people to help with walking and/or transfers;Two people to help with bathing/dressing/bathroom;Assistance with cooking/housework;Assistance with feeding;Direct supervision/assist for medications management;Direct supervision/assist for financial management;Assist for transportation;Help with stairs or ramp for entrance   Equipment Recommendations  BSC/3in1;Wheelchair (measurements OT);Wheelchair cushion (measurements OT)     Recommendations for Other Services      Precautions / Restrictions Precautions Precautions: Fall Precaution Comments: trach 10L/60%, FiO2,  R wrist restraint, peg tube recently inappropriately removed by patient.       Mobility Bed Mobility Overal bed mobility: Needs Assistance Bed Mobility: Rolling, Sidelying to Sit Rolling: Max assist, +2 for physical assistance Sidelying to sit: Max assist, +2 for physical assistance   Sit to supine: Total assist, +2 for physical assistance   General bed mobility comments: required assistance for rolling and getting to EOB and back to supine.    Transfers Overall transfer level: Needs assistance     Sit to Stand: Total assist, +2 physical assistance          Lateral/Scoot Transfers: +2 physical assistance, Max assist General transfer comment: Performed partial stand to position on EOB and lateral scoot to prepare for supine     Balance Overall balance assessment: Needs assistance Sitting-balance support: No upper extremity supported, Feet supported Sitting balance-Leahy Scale: Poor Sitting balance - Comments: used RUE to push to left and required verbal cues to keep RUE in lap Postural control: Other (comment), Left lateral lean, Posterior lean (pushing)                                 ADL either performed or assessed with clinical judgement   ADL Overall ADL's : Needs assistance/impaired             Lower Body Bathing: Maximal assistance;Bed level Lower Body Bathing Details (indicate cue type and reason): rolled side to side to address cleaning following soiled bed  Extremity/Trunk Assessment Upper Extremity Assessment LUE Deficits / Details: PROM WFL; no AROM observed; edematous L hand; elevated on 2 pillows LUE Sensation: decreased proprioception;decreased light touch LUE Coordination: decreased fine motor;decreased gross motor            Vision        Perception     Praxis      Cognition Arousal/Alertness: Awake/alert Behavior During Therapy: Restless Overall Cognitive Status: Impaired/Different from baseline Area of Impairment: Attention, Following commands, Safety/judgement                   Current Attention Level: Sustained   Following Commands: Follows multi-step commands consistently Safety/Judgement: Decreased awareness of safety, Decreased awareness of deficits   Problem Solving: Requires tactile cues, Requires verbal cues, Difficulty sequencing General Comments: more alert and kept eyes open.  Responded with gesters and written word        Exercises      Shoulder Instructions       General Comments      Pertinent Vitals/ Pain       Pain Assessment Pain Assessment: Faces Faces Pain Scale: Hurts a little bit Pain Location: stomach (BM), back (notified via gestures) Pain Descriptors / Indicators: Discomfort, Grimacing, Guarding Pain Intervention(s): Monitored during session, Repositioned  Home Living                                          Prior Functioning/Environment              Frequency  Min 2X/week        Progress Toward Goals  OT Goals(current goals can now be found in the care plan section)  Progress towards OT goals: Progressing toward goals  Acute Rehab OT Goals OT Goal Formulation: Patient unable to participate in goal setting Time For Goal Achievement: 10/13/21 Potential to Achieve Goals: Good ADL Goals Pt Will Perform Grooming: with set-up;sitting;with supervision Pt Will Perform Upper Body Bathing: with min assist;sitting Pt Will Perform Lower Body Bathing: with mod assist;sit to/from stand Pt Will Transfer to Toilet: with mod assist;with +2 assist;bedside commode Additional ADL Goal #1: MAintain midline postural control with S to increase independence with ADL and mobility  Plan Discharge plan remains appropriate    Co-evaluation    PT/OT/SLP  Co-Evaluation/Treatment: Yes Reason for Co-Treatment: For patient/therapist safety;Complexity of the patient's impairments (multi-system involvement) PT goals addressed during session: Mobility/safety with mobility;Balance;Strengthening/ROM OT goals addressed during session: Strengthening/ROM      AM-PAC OT "6 Clicks" Daily Activity     Outcome Measure   Help from another person eating meals?: Total Help from another person taking care of personal grooming?: A Lot Help from another person toileting, which includes using toliet, bedpan, or urinal?: Total Help from another person bathing (including washing, rinsing, drying)?: Total Help from another person to put on and taking off regular upper body clothing?: Total Help from another person to put on and taking off regular lower body clothing?: Total 6 Click Score: 7    End of Session Equipment Utilized During Treatment: Oxygen  OT Visit Diagnosis: Unsteadiness on feet (R26.81);Other abnormalities of gait and mobility (R26.89);Muscle weakness (generalized) (M62.81);Other symptoms and signs involving the nervous system (R29.898);Other symptoms and signs involving cognitive function;Hemiplegia and hemiparesis Hemiplegia - Right/Left: Left Hemiplegia - dominant/non-dominant: Non-Dominant Hemiplegia - caused by: Nontraumatic intracerebral hemorrhage   Activity Tolerance Patient tolerated treatment well  Patient Left in bed;with call bell/phone within reach;with bed alarm set   Nurse Communication Mobility status        Time: [id number] OT Time Calculation (min): 30 min  Charges: OT General Charges $OT Visit: 1 Visit OT Treatments $Self Care/Home Management : 8-22 mins  Lodema Hong, [place]  Pager [phone] [place] 10/12/2021, 1:02 PM

## 2021-10-12 NOTE — Procedures (Signed)
Interventional Radiology Procedure:   Indications: Dislodged gastrostomy tube  Procedure: Gastrostomy tube replacement  Findings: Foley catheter was injected to confirm placement in stomach.  Foley was removed and 18 Fr gastrostomy tube was placed over a wire.  Complications: No immediate complications noted.     EBL: Minimal  Plan: New gastrostomy tube is ready to use.    Elzie Sheets R. Lowella Dandy, MD  Pager: [phone]

## 2021-10-12 NOTE — Progress Notes (Signed)
STROKE TEAM PROGRESS NOTE   INTERVAL HISTORY Patient is seen in his room with no family at the bedside.  He has been hemodynamically stable and his neurological exam is unchanged.  He is follows commands on the right side but remains plegic on the left.  Vital signs are stable.  Continues to be on trach collar tolerating well.  Lab work from today CBC and BMP are unremarkable.  Awaiting decision on insurance approval for LTAC  Vitals:   10/12/21 0730 10/12/21 0825 10/12/21 1144 10/12/21 1152  BP: (!) 159/79   (!) 163/83  Pulse: 90 82 91 88  Resp: (!) 24 18 (!) 28 (!) 22  Temp: 98.6 F (37 C)   98.7 F (37.1 C)  TempSrc: Oral   Axillary  SpO2:  95% 94%   Weight:      Height:       CBC:  Recent Labs  Lab 10/10/21 0103 10/11/21 0245  WBC 11.4* 12.1*  NEUTROABS 8.0* 8.9*  HGB 9.9* 10.7*  HCT 30.7* 33.4*  MCV 93.3 93.0  PLT 451* [id number]*   Basic Metabolic Panel:  Recent Labs  Lab 10/09/21 0031 10/10/21 0103 10/11/21 0245  NA 137 140 144  K 3.7 3.4* 3.7  CL 103 103 108  CO2 25 27 27   GLUCOSE 141* 138* 158*  BUN 25* 20 23*  CREATININE 0.92 0.86 0.92  CALCIUM 8.9 8.8* 9.0  MG 2.5*  --   --    Lipid Panel:  No results for input(s): CHOL, TRIG, HDL, CHOLHDL, VLDL, LDLCALC in the last 168 hours.  HgbA1c: No results for input(s): HGBA1C in the last 168 hours.  Urine Drug Screen:  No results for input(s): LABOPIA, COCAINSCRNUR, LABBENZ, AMPHETMU, THCU, LABBARB in the last 168 hours.   Alcohol Level No results for input(s): ETH in the last 168 hours.  IMAGING past 24 hours No results found.  PHYSICAL EXAM  Physical Exam  Constitutional: Obese middle-aged Caucasian male not in distress.  Status post tracheostomy Cardiovascular: Normal rate and regular rhythm.  Respiratory: Respirations even and unlabored on trach collar  Neuro: Awake and interactive.  Eyes closed, does open on voice, tracking to the right.  Left facial droop. Spontaneous movement of right UE and LE  with purpose. Will follow commands with right hand, no movement of LUE and LLE with hypotonia and dense left hemiplegia.   ASSESSMENT/PLAN [name] is a 61 y.o. male with history of AFib on Coumadin s/p recent conversion, HTN, HLD, remote tobacco abuse, Cataract OU, glaucoma, obesity, CVA, DM II, and OSA who initially presented to the ED via EMS left sided weakness, dysarthria, and right fixed gaze. Coumadin reversed. Labetalol and cleviprex used for BP greater than 140 during CT. Patient was then intubated due to somnolence and inability to protect his airway. CT and MRI show stable right thalamic hemorrhage with intraventricular extension. ICH score 2. Febrile. Hypertonic saline discontinued 1/23. Most recent sodium is 155.  Currently in NSR with flecainide continued. Holding home antihypertensive medications currently. No longer using cleviprex or norepinephrine (r/t hypotension after RSI) for BP control. Repeat CT shows no significant change in the hemorrhage or ventricle size. Weaning 1/28 and 1/29 at 30% , 5/5. Extubated 1/29 AM and reintubated 1/29 PM due to copious secretions.  Tracheostomy was placed 1/31. PEG tube placed 2/3. He remains on trach collar and is awaiting placement for rehab/LTACH.   Stroke:  Acute right thalamic ICH and IVH likely secondary to hypertension in the setting  of coumadin use Code stroke CT - Positive for acute right thalamic hemorrhage with intraventricular extension. Estimated intra-axial blood volume 25 mL. Mild Regional brain edema. Trace leftward midline shift. 1/22 Repeat CT shows no changes from code stroke CT MRI  Stable right thalamic hemorrhage since presentation. Regional edema, tracking into the midbrain. 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 No increased intracranial mass effect. MRA  Abundant chronic micro-hemorrhages in the brain superimposed on chronic lacunar infarcts compatible with advanced small vessel disease CT head 1/27  - unchanged hematoma and ventricular size Carotid Doppler  1-39% stenosis in Left and Right ICA 2D Echo EF 60-65% LDL 52 HgbA1c 9.7 VTE prophylaxis - heparin warfarin daily prior to admission, now on No antithrombotic due to [place] Therapy recommendations:  LTACH- [facility] evaluating patient. CIR signed off. Disposition:  pending  Cerebral Edema with left midline shift Stable MRI and repeat CT scan Hypertonic saline discontinued d/c Na serial checks Allow Na trending down gradually  Free water flushes given 200cc->400cc q4h->200 Q3h  Acute hypoxemic respiratory failure CCM on board Difficult intubation r/t tracheal stenosis Extubated 1/29 and reintubated Tracheostomy 1/31 PEG 2/3  Atrial Fibrillation Home meds: Flecainide, coreg, coumadin Rate controlled now on No antithrombotic due to [place] Per Dr. Leonie Man, pt is ASPIRE candidate. Pending consent once stable  Fever, improved Blood culture 1/2 staph epidermidis Repeat blood culture no growth Tmax 102.4->100.7 -> 100.4->100.6->afebrile WBC 11.4-> 12.1 completed rocephin course  Hypertension Home meds:  Amlodipine, coreg, losartan-hydrochlorothiazide BP goal less than 160 BP on the high end On coreg 25 bid and norvasc 10 Increase hydralazine to 100 Q8 Long term BP goal normotensive  Hyperlipidemia Home meds: Lipitor 80 LDL 52, goal < 70 Consider to resume Lipitor on discharge  Diabetes type II Uncontrolled Home meds:  Metformin, jardiance HgbA1c 9.7 , goal < 7.0 CBGs SSI On levemir Close PCP follow-up as outpatient for better DM control  Dysphagia  PEG 2/3 On TF @ 50 and FW  Other Stroke Risk Factors Former smoker - quit 20 years ago Obesity, Body mass index is 35.16 kg/m., BMI >/= 30 associated with increased stroke risk, recommend weight loss, diet and exercise as appropriate  Obstructive sleep apnea   Hospital day # 22  Patient has pulled out PEG tube but seems to be doing well and this will be replaced  this afternoon.  Continue ongoing therapies and awaiting insurance decision on approval for LTAC hopefully next few days. I spoke to the patient's son Legrand Como over the phone and he isy interested in participating in the ASPIRE stroke prevention study(Eliquis versus aspirin in patients with A-fib with intracerebral hemorrhage) and he needed rehab this afternoon to sign informed consent form and hopefully enroll him in the study.  .  Greater than 50% time during this 35-minute visit was spent on counseling and coordination of care about the sublimation atrial fibrillation stroke prevention discussion and answering questions.  Discussed with Dr. Cameron Sprang, MD Medical Director [place] Pager: [phone] 10/12/2021 2:04 PM    To contact Stroke Continuity provider, please refer to [email or web address] After hours, contact General Neurology

## 2021-10-13 ENCOUNTER — Inpatient Hospital Stay (HOSPITAL_COMMUNITY): Payer: No Typology Code available for payment source

## 2021-10-13 DIAGNOSIS — E1100 Type 2 diabetes mellitus with hyperosmolarity without nonketotic hyperglycemic-hyperosmolar coma (NKHHC): Secondary | ICD-10-CM | POA: Diagnosis not present

## 2021-10-13 DIAGNOSIS — I619 Nontraumatic intracerebral hemorrhage, unspecified: Secondary | ICD-10-CM | POA: Insufficient documentation

## 2021-10-13 DIAGNOSIS — Z930 Tracheostomy status: Secondary | ICD-10-CM | POA: Diagnosis not present

## 2021-10-13 DIAGNOSIS — N179 Acute kidney failure, unspecified: Secondary | ICD-10-CM | POA: Diagnosis not present

## 2021-10-13 DIAGNOSIS — I161 Hypertensive emergency: Secondary | ICD-10-CM | POA: Diagnosis not present

## 2021-10-13 DIAGNOSIS — D72829 Elevated white blood cell count, unspecified: Secondary | ICD-10-CM

## 2021-10-13 DIAGNOSIS — R5381 Other malaise: Secondary | ICD-10-CM

## 2021-10-13 DIAGNOSIS — I69191 Dysphagia following nontraumatic intracerebral hemorrhage: Secondary | ICD-10-CM

## 2021-10-13 LAB — BASIC METABOLIC PANEL WITH GFR
Anion gap: 13 (ref 5–15)
BUN: 31 mg/dL — ABNORMAL HIGH (ref 6–20)
CO2: 25 mmol/L (ref 22–32)
Calcium: 9.1 mg/dL (ref 8.9–10.3)
Chloride: 102 mmol/L (ref 98–111)
Creatinine, Ser: 1.38 mg/dL — ABNORMAL HIGH (ref 0.61–1.24)
GFR, Estimated: 59 mL/min — ABNORMAL LOW
Glucose, Bld: 209 mg/dL — ABNORMAL HIGH (ref 70–99)
Potassium: 3.9 mmol/L (ref 3.5–5.1)
Sodium: 140 mmol/L (ref 135–145)

## 2021-10-13 LAB — GLUCOSE, CAPILLARY
Glucose-Capillary: 148 mg/dL — ABNORMAL HIGH (ref 70–99)
Glucose-Capillary: 205 mg/dL — ABNORMAL HIGH (ref 70–99)
Glucose-Capillary: 222 mg/dL — ABNORMAL HIGH (ref 70–99)
Glucose-Capillary: 240 mg/dL — ABNORMAL HIGH (ref 70–99)
Glucose-Capillary: 241 mg/dL — ABNORMAL HIGH (ref 70–99)
Glucose-Capillary: 245 mg/dL — ABNORMAL HIGH (ref 70–99)
Glucose-Capillary: 259 mg/dL — ABNORMAL HIGH (ref 70–99)

## 2021-10-13 LAB — CBC WITH DIFFERENTIAL/PLATELET
Abs Immature Granulocytes: 0.12 10*3/uL — ABNORMAL HIGH (ref 0.00–0.07)
Basophils Absolute: 0.1 10*3/uL (ref 0.0–0.1)
Basophils Relative: 0 %
Eosinophils Absolute: 0.3 10*3/uL (ref 0.0–0.5)
Eosinophils Relative: 2 %
HCT: 34.5 % — ABNORMAL LOW (ref 39.0–52.0)
Hemoglobin: 11.1 g/dL — ABNORMAL LOW (ref 13.0–17.0)
Immature Granulocytes: 1 %
Lymphocytes Relative: 12 %
Lymphs Abs: 1.7 10*3/uL (ref 0.7–4.0)
MCH: 30 pg (ref 26.0–34.0)
MCHC: 32.2 g/dL (ref 30.0–36.0)
MCV: 93.2 fL (ref 80.0–100.0)
Monocytes Absolute: 1.8 10*3/uL — ABNORMAL HIGH (ref 0.1–1.0)
Monocytes Relative: 13 %
Neutro Abs: 10.3 10*3/uL — ABNORMAL HIGH (ref 1.7–7.7)
Neutrophils Relative %: 72 %
Platelets: 448 10*3/uL — ABNORMAL HIGH (ref 150–400)
RBC: 3.7 MIL/uL — ABNORMAL LOW (ref 4.22–5.81)
RDW: 12.6 % (ref 11.5–15.5)
WBC: 14.2 10*3/uL — ABNORMAL HIGH (ref 4.0–10.5)
nRBC: 0 % (ref 0.0–0.2)

## 2021-10-13 MED ORDER — INSULIN GLARGINE-YFGN 100 UNIT/ML ~~LOC~~ SOLN
5.0000 [IU] | Freq: Every day | SUBCUTANEOUS | Status: DC
  Administered 2021-10-13: 5 [IU] via SUBCUTANEOUS
  Filled 2021-10-13 (×2): qty 0.05

## 2021-10-13 MED ORDER — SODIUM CHLORIDE 0.9 % IV SOLN: INTRAVENOUS | Status: DC

## 2021-10-13 NOTE — Assessment & Plan Note (Addendum)
Recent swallowing trial with PMV in place with documented coughing on 1 of 2 thin liquid trials.-Tolerated non thin liquids better without valve in place Patient unable to wear valve long enough for length of meal Reevaluated on 2/14 and noted with significant oropharyngeal dysphagia Continue current tube feedings with protein supplementation

## 2021-10-13 NOTE — Hospital Course (Signed)
61 year old male with history of A-fib on Coumadin, hyperlipidemia, hypertension, diabetes mellitus type 2, OSA, prior CVA who presented to the emergency department on 1/12 as code stroke.  He presented with left-sided weakness, dysarthria, right fixed gaze.  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  Neurology consulted.  Patient was intubated in the ED. patient was unable to wean off of mechanical ventilation so he was trached on 09/29/2021, PEG placement done on 10/02/2021.  Transferred to [facility] service on 10/05/2021.  Hospital course remarkable for persistent encephalopathy.  PT/OT recommending CIR but looks like he will not be able to participate with therapy anytime soon so the current plan is for [facility].  TOC following

## 2021-10-13 NOTE — Assessment & Plan Note (Addendum)
On amlodipine, Coreg, hydralazine  Target blood pressures less than 160 mmHg Does have early morning hypertension with SBP's in the 170s prior to medication administration

## 2021-10-13 NOTE — Progress Notes (Signed)
STROKE TEAM PROGRESS NOTE   INTERVAL HISTORY Patient is seen leaving his room for swallow eval.  He has been hemodynamically stable and his neurological exam is unchanged.  He is follows commands on the right side but remains plegic on the left.  Vital signs are stable.  Continues to be on trach collar tolerating well.  He had PEG tube replaced yesterday by interventional radiology.  His son has signed consent form for participation in the ASPIRE stroke prevention study and patient will be randomized today to Eliquis versus aspirin.  Awaiting decision on insurance approval for LTAC  Vitals:   10/13/21 0846 10/13/21 1135 10/13/21 1142 10/13/21 1430  BP:  (!) 170/79    Pulse: 92 85 86 86  Resp: (!) 25 19 (!) 28 (!) 29  Temp:  98.8 F (37.1 C)    TempSrc:  Oral    SpO2: 97%  95% 95%  Weight:      Height:       CBC:  Recent Labs  Lab 10/11/21 0245 10/13/21 0339  WBC 12.1* 14.2*  NEUTROABS 8.9* 10.3*  HGB 10.7* 11.1*  HCT 33.4* 34.5*  MCV 93.0 93.2  PLT 445* [id number]*   Basic Metabolic Panel:  Recent Labs  Lab 10/09/21 0031 10/10/21 0103 10/11/21 0245 10/13/21 0339  NA 137   < > 144 140  K 3.7   < > 3.7 3.9  CL 103   < > 108 102  CO2 25   < > 27 25  GLUCOSE 141*   < > 158* 209*  BUN 25*   < > 23* 31*  CREATININE 0.92   < > 0.92 1.38*  CALCIUM 8.9   < > 9.0 9.1  MG 2.5*  --   --   --    < > = values in this interval not displayed.   Lipid Panel:  No results for input(s): CHOL, TRIG, HDL, CHOLHDL, VLDL, LDLCALC in the last 168 hours.  HgbA1c: No results for input(s): HGBA1C in the last 168 hours.  Urine Drug Screen:  No results for input(s): LABOPIA, COCAINSCRNUR, LABBENZ, AMPHETMU, THCU, LABBARB in the last 168 hours.   Alcohol Level No results for input(s): ETH in the last 168 hours.  IMAGING past 24 hours IR Replc Gastro/Colonic Tube Percut W/Fluoro  Result Date: 10/12/2021 INDICATION: Gastrostomy tube was recently dislodged and replaced with a Foley catheter.  Patient needs placement of a new gastrostomy tube. EXAM: GASTROSTOMY TUBE REPLACEMENT WITH FLUOROSCOPY MEDICATIONS: None ANESTHESIA/SEDATION: None CONTRAST:  10 mL Omnipaque 300-administered into the gastric lumen. FLUOROSCOPY: Radiation Exposure Index (as provided by the fluoroscopic device): 4 mGy Kerma COMPLICATIONS: None immediate. PROCEDURE: Consent was obtained for replacement of the gastrostomy tube. Maximal Sterile Barrier Technique was utilized including caps, mask, sterile gowns, sterile gloves, sterile drape, hand hygiene and skin antiseptic. A timeout was performed prior to the initiation of the procedure. Foley catheter was present. Contrast injection confirmed placement in the stomach. Balloon was deflated and the Foley catheter was completely removed. A 5 French Kumpe catheter and stiff Amplatz wire were advanced into the stomach. Tract was dilated to accommodate an 67 [place] Entuit gastrostomy tube. Balloon was inflated with approximately 5 mL of saline. Contrast injection confirmed placement in the stomach. Tube was flushed with saline. Fluoroscopic images were taken and saved for this procedure. IMPRESSION: Successful replacement of the gastrostomy tube. Patient currently has an 40 French balloon retention tube. Electronically Signed   By: Markus Daft M.D.   On:  10/12/2021 17:55    PHYSICAL EXAM  Physical Exam  Constitutional: Obese middle-aged Caucasian male not in distress.  Status post tracheostomy Cardiovascular: Normal rate and regular rhythm.  Respiratory: Respirations even and unlabored on trach collar  Neuro: Awake and interactive.  Eyes closed, does open on voice, tracking to the right.  Left facial droop. Spontaneous movement of right UE and LE with purpose. Will follow commands with right hand, no movement of LUE and LLE with hypotonia and dense left hemiplegia.   ASSESSMENT/PLAN [name] is a 61 y.o. male with history of AFib on Coumadin s/p recent conversion,  HTN, HLD, remote tobacco abuse, Cataract OU, glaucoma, obesity, CVA, DM II, and OSA who initially presented to the ED via EMS left sided weakness, dysarthria, and right fixed gaze. Coumadin reversed. Labetalol and cleviprex used for BP greater than 140 during CT. Patient was then intubated due to somnolence and inability to protect his airway. CT and MRI show stable right thalamic hemorrhage with intraventricular extension. ICH score 2. Febrile. Hypertonic saline discontinued 1/23. Most recent sodium is 155.  Currently in NSR with flecainide continued. Holding home antihypertensive medications currently. No longer using cleviprex or norepinephrine (r/t hypotension after RSI) for BP control. Repeat CT shows no significant change in the hemorrhage or ventricle size. Weaning 1/28 and 1/29 at 30% , 5/5. Extubated 1/29 AM and reintubated 1/29 PM due to copious secretions.  Tracheostomy was placed 1/31. PEG tube placed 2/3. He remains on trach collar and is awaiting placement for rehab/LTACH.   Stroke:  Acute right thalamic ICH and IVH likely secondary to hypertension in the setting of coumadin use Code stroke CT - Positive for acute right thalamic hemorrhage with intraventricular extension. Estimated intra-axial blood volume 25 mL. Mild Regional brain edema. Trace leftward midline shift. 1/22 Repeat CT shows no changes from code stroke CT MRI  Stable right thalamic hemorrhage since presentation. Regional edema, tracking into the midbrain. 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 No increased intracranial mass effect. MRA  Abundant chronic micro-hemorrhages in the brain superimposed on chronic lacunar infarcts compatible with advanced small vessel disease CT head 1/27 - unchanged hematoma and ventricular size Carotid Doppler  1-39% stenosis in Left and Right ICA 2D Echo EF 60-65% LDL 52 HgbA1c 9.7 VTE prophylaxis - heparin warfarin daily prior to admission, now on No antithrombotic due to  [place] Therapy recommendations:  LTACH- [facility] evaluating patient. CIR signed off. Disposition:  pending  Cerebral Edema with left midline shift Stable MRI and repeat CT scan Hypertonic saline discontinued d/c Na serial checks Allow Na trending down gradually  Free water flushes given 200cc->400cc q4h->200 Q3h  Acute hypoxemic respiratory failure CCM on board Difficult intubation r/t tracheal stenosis Extubated 1/29 and reintubated Tracheostomy 1/31 PEG 2/3  Atrial Fibrillation Home meds: Flecainide, coreg, coumadin Rate controlled now on No antithrombotic due to [place] His son has signed consent form for participation in the ASPIRE stroke prevention study and patient will be randomized 10/13/21 to Eliquis versus aspirin.  Fever, improved Blood culture 1/2 staph epidermidis Repeat blood culture no growth Tmax 102.4->100.7 -> 100.4->100.6->afebrile WBC 11.4-> 12.1 completed rocephin course  Hypertension Home meds:  Amlodipine, coreg, losartan-hydrochlorothiazide BP goal less than 160 BP on the high end On coreg 25 bid and norvasc 10 Increase hydralazine to 100 Q8 Long term BP goal normotensive  Hyperlipidemia Home meds: Lipitor 80 LDL 52, goal < 70 Consider to resume Lipitor on discharge  Diabetes type II Uncontrolled Home meds:  Metformin, jardiance HgbA1c 9.7 , goal < 7.0 CBGs SSI On levemir Close PCP follow-up as outpatient for better DM control  Dysphagia  PEG 2/3 On TF @ 50 and FW  Other Stroke Risk Factors Former smoker - quit 20 years ago Obesity, Body mass index is 34.74 kg/m., BMI >/= 30 associated with increased stroke risk, recommend weight loss, diet and exercise as appropriate  Obstructive sleep apnea   Hospital day # 23  Patient remains neurologically stable and PEG tube have been being replaced by interventional radiology.  Awaiting insurance approval for transfer to LTAC.Marland Kitchen His son has signed consent form for participation in the ASPIRE stroke  prevention study and patient will be randomized today to Eliquis versus aspirin. Greater than 50% time during this 35-minute visit was spent on counseling and coordination of care about the sublimation atrial fibrillation stroke prevention discussion and answering questions.  Discussed with Dr. Cameron Sprang, MD Medical Director [place] Pager: [phone] 10/13/2021 2:52 PM    To contact Stroke Continuity provider, please refer to [email or web address] After hours, contact General Neurology

## 2021-10-13 NOTE — Assessment & Plan Note (Addendum)
Currently afebrile. One of the blood cultures showed Staph epidermidis: Most likely contamination.  Repeat blood cultures have been negative.   Fever was most likely secondary to intracerebral hemorrhage. He completed the course of Rocephin. Tracheal aspirate is showing gram-negative rods, gram-positive cocci in pairs, no staph or Pseudomonas.  Since leukocytosis was worsening,  restarted ceftriaxone.chest x-ray showed mild patchy airspace opacities at the bases potentially atelectasis or infection.  Completed 5-day course of Rocephin on 2/5 See additional documentation regarding acute UTI

## 2021-10-13 NOTE — Progress Notes (Signed)
Patient added to DTP list for discharge planning. Per recent notes, insurance authorization was started by [facility].  Madilyn Fireman, MSW, LCSW Transitions of Care   Clinical Social Worker II [phone]

## 2021-10-13 NOTE — Progress Notes (Addendum)
Progress Note   Patient: Caleb Vaughan [id number] DOB: Jan 16, 1961 DOA: 09/20/2021     23 DOS: the patient was seen and examined on 10/13/2021   Brief hospital course: 61 year old male with history of A-fib on Coumadin, hyperlipidemia, hypertension, diabetes mellitus type 2, OSA, prior CVA who presented to the emergency department on 1/12 as code stroke.  He presented with left-sided weakness, dysarthria, right fixed gaze.  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  Neurology consulted.  Patient was intubated in the ED. patient was unable to wean off of mechanical ventilation so he was trached on 09/29/2021, PEG placement done on 10/02/2021.  Transferred to [facility] service on 10/05/2021.  Hospital course remarkable for persistent encephalopathy.  PT/OT recommending CIR but looks like he will not be able to participate with therapy anytime soon so the current plan is for [facility].  TOC following    Assessment and Plan: Nontraumatic thalamic hemorrhage w/ cerebral edema and midline shift- (present on admission) Presented with left-sided weakness, dysarthria, right fixed gaze.  CT imaging showed acute right thalamic intracerebral hemorrhage and intraventricular hemorrhage.   Likely on the background of severe hypertension/concomitant Coumadin use.   MRI of the brain showed a stable right thalamic hemorrhage, regional edema, stable to moderate intraventricular hemorrhage with no ventriculomegaly.  MRA showed chronic lacunar infarcts compatible with advanced small vessel disease.  Carotid Doppler showed 1-39% stenosis on left and right ICA. Echocardiogram showed EF of 62%.  LDL of 52. Hemoglobin A1c of 9.7. Current plan is LTAC.  Awaiting insurance authorization Follows commands, intermittently agitated.  Started on Seroquel 50 mg at bedtime Cerebral edema stable as per MRI and follow-up CT scan.  Was initially started on  hypertonic saline now discontinued. Communication improving he is able to write some with his right hand and communicate by nodding yes or no and does not appear to have any receptive aphasia or any expressive aphasia but difficult to accurately assess given trach    Acute respiratory failure with hypoxia ([place]) Had to be intubated on presentation.  Extubated on 1/29 and had to be reintubated.   Status post trach placement.  FiO2 60%     Tracheostomy dependence (HCC) Stable but still requiring 60% FiO2 Continued thick secretions limits PMSV but patient has been tolerating Trach team following   Dysphagia following nontraumatic intracerebral hemorrhage Recent swallowing trial with PMV in place with documented coughing on 1 of 2 thin liquid trials.-Tolerated non thin liquids better without valve in place Patient unable to wear valve longer not for length of meal  Hypertension associated with diabetes ([place]) On amlodipine, Coreg, hydralazine and Norvasc Target blood pressures less than 160 mmHg  AKI (acute kidney injury) ([place]) Baseline creatinine 0.8-0.9 Current creatinine 1.38 with mildly elevated BUN Continue free water Not on any offending medication 2/14 begin normal saline at 100 cc/h and follow labs  Type 2 diabetes mellitus ([place]) Uncontrolled.  Hemoglobin A1c of 9.7.   On Jardiance, metformin at home.   Currently on sliding scale insulin. CBGs poorly controlled with readings between 140s and 240s We will begin low-dose long-acting insulin (Semglee)-first dose will be at HS tonight  Obstructive sleep apnea/obesity- (present on admission) Unknown if on CPAP at home Currently has trach BMI 35.2  Physical deconditioning Secondary to stroke with left hemiparesis noted as well as lack of sensation on the left Continue PT and OT Plan for short-term rehabilitation and continued therapy at [facility]  Leukocytosis  Currently afebrile. One of the blood cultures showed Staph  epidermidis: Most likely contamination.  Repeat blood cultures have been negative.   Fever was most likely secondary to intracerebral hemorrhage. He completed the course of Rocephin. Tracheal aspirate is showing gram-negative rods, gram-positive cocci in pairs, no staph or Pseudomonas.  Since leukocytosis was worsening,  restarted ceftriaxone.chest x-ray showed mild patchy airspace opacities at the bases potentially atelectasis or infection.  Completed 5-day course of Rocephin on 2/5  Paroxysmal A-fib ([place])- (present on admission) Currently not on anticoagulation due to ICH-on warfarin prior to admission Continue flecainide  Hypertensive emergency Presented with significantly elevated blood pressure.  BP now controlled        Subjective:  Patient awake and able to communicate by nodding head  Physical Exam: Vitals:   10/13/21 0329 10/13/21 0339 10/13/21 0500 10/13/21 0846  BP: (!) 144/66     Pulse: 81 84  92  Resp: (!) 22 (!) 37  (!) 25  Temp: 98.9 F (37.2 C)     TempSrc: Axillary     SpO2: 97% 93%  97%  Weight:   91.8 kg   Height:       General: Awake but somewhat sedated appearing male, unable to follow commands but still requiring wrist restraints Pulmonary: 6.0 cuffed Shiley XLT trach, FiO2 60% anterior lung sounds coarse with wet sounding cough.  Increased work of breathing at rest Cardiac: S1-S2, normotensive, no peripheral edema except for some focal edema in LUE in context of hemiparesis Abdomen: Soft nontender nondistended with normoactive bowel sounds.  PEG tube in place with tube feeding infusing. LBM 2/13 Genitourinary: 69 French Foley to urinary collection bag-has been in place 22-day Musculoskeletal: Flaccid left side.  No hypertonicity Neurological: Nerves II through XII appear to be grossly intact but exam limited by current neurological status and requirement for tracheostomy tube.  Flaccid on left side and patient able to relate that he is insensate on the  left.  Moves right upper extremity well requiring wrist restraint strength appears to be about 4/5 Psychiatric: Awake and appears to be understanding questions when presented but accurate assessment of psychiatric status unable to be accurately obtained during lack of ability to communicate effectively secondary to trach  Data Reviewed:  Results have been reviewed and noted in the problem list  Family Communication:  Patient only  Medically stable No unless discharged to [place] vaccination status:  [place] 12/06/2019 and 12/27/2019  Consultants: PCCM General surgery Procedures: Echocardiogram PEG tube per GI Antibiotics: Azithromycin x1 dose 1/22 Ceftriaxone x1 dose 1/22 Ceftriaxone 2/1 through 2/5   Disposition: Remains inpatient appropriate because:  Continues to require oxygen at 60%, frequent suctioning and has an unsafe discharge plan until insurance authorizes placement in LTAC   A physical therapy consult is indicated based on the patients mobility assessment.   Mobility Assessment (last 72 hours)     Mobility Assessment     Row Name 10/13/21 0754 10/12/21 2000 10/12/21 1200   Does patient have an order for bedrest or is patient medically unstable No - Continue assessment No - Continue assessment --   What is the highest level of mobility based on the progressive mobility assessment? Level 2 ([place]) - Balance while sitting on edge of bed and cannot stand Level 2 (Chair[place]) - Balance while sitting on edge of bed and cannot stand Level 2 (Chairfast) - Balance while sitting on edge of bed and cannot stand   Is the above level different from baseline  mobility prior to current illness? Yes - Recommend PT order Yes - Recommend PT order --    [place] Name 10/12/21 1126 10/12/21 0825 10/11/21 2000   Does patient have an order for bedrest or is patient medically unstable -- No - Continue assessment No - Continue assessment   What is the highest level of mobility based  on the progressive mobility assessment? Level 2 (Chair[place]) - Balance while sitting on edge of bed and cannot stand Level 2 (Chairfast) - Balance while sitting on edge of bed and cannot stand Level 1 (Bedfast) - Unable to balance while sitting on edge of bed   Is the above level different from baseline mobility prior to current illness? -- Yes - Recommend PT order Yes - Recommend PT order    Row Name 10/11/21 1400 10/10/21 2000     Does patient have an order for bedrest or is patient medically unstable No - Continue assessment No - Continue assessment    What is the highest level of mobility based on the progressive mobility assessment? Level 1 (Bedfast) - Unable to balance while sitting on edge of bed Level 1 (Bedfast) - Unable to balance while sitting on edge of bed    Is the above level different from baseline mobility prior to current illness? Yes - Recommend PT order --            Planned Discharge Destination:  LTAC      Time spent: 35 minutes  Author: Erin Hearing, NP 10/13/2021 11:27 AM  For on call review [email or web address].

## 2021-10-13 NOTE — Assessment & Plan Note (Addendum)
Had to be intubated on presentation.  Extubated on 1/29 and had to be reintubated.   Now trach dependent.  O2 needs have decreased from 60% to 28% %.  We will continue to follow.  Secretions have also diminished

## 2021-10-13 NOTE — Assessment & Plan Note (Addendum)
Currently not on anticoagulation due to ICH-on warfarin prior to admission Continue flecainide Currently rate controlled with ventricular rates in the 70-90 range

## 2021-10-13 NOTE — Procedures (Signed)
Tracheostomy Change Note  Patient Details:   Name: Caleb Vaughan DOB: [birth year] MRN: [id number]    Airway Documentation:     Evaluation  O2 sats: stable throughout Complications: No apparent complications Patient did tolerate procedure well. Bilateral Breath Sounds: Diminished, Clear, Other (Comment) (coarse)   + EZCAP color change (purple to yellow) s/p trach change.  Trach changed by Marni Griffon, NP CCM  w/ no apparent complications.    Lenna Sciara 10/13/2021, 2:33 PM

## 2021-10-13 NOTE — Progress Notes (Signed)
Modified Barium Swallow Progress Note  Patient Details  Name: Caleb Vaughan MRN: [id number] Date of Birth: [birth year]  Today's Date: 10/13/2021  Modified Barium Swallow completed.  Full report located under Chart Review in the Imaging Section.  Brief recommendations include the following:  Clinical Impression  Patient presents with a severe oropharyngeal dysphagia with suspected significant impact from cognitive impairment. Patient appeared distracted and did not consistently follow commands. SLP performed oral care via suction and removed mild amount of secretions. PMV was briefly donned but patient did not appear to tolerate and it was quickly removed. Patient exhibited a mod-severe oral and severe pharyngeal dysphagia. During oral phase, anterior to posterior transit was delayed of nectar thick liquid via spoon sip and puree solid via spoon. In addition, patient had oral residuals on tongue post initial swallow. During pharyngeal phase, swallow was initiated at level of vallecular sinus, however patient exhibited delays of at least 90 seconds before swallow was initiated. Only trace vallecular residuals observed with nectar thick liquids and mild residuals with puree solids. No penetration or aspiration. SLP is recommending continue NPO status and continue with PO trials of nectar thick liquids via spoon and puree solids via spoon with SLP only.   Swallow Evaluation Recommendations       SLP Diet Recommendations: NPO;Other (Comment) (PO trials puree solids, nectar thick liquids via spoon with SLP only)             Oral Care Recommendations: Oral care QID;Staff/trained caregiver to provide oral care       Angela Nevin, MA, CCC-SLP Speech Therapy

## 2021-10-13 NOTE — Procedures (Signed)
Tracheostomy Exchange Procedure Note  Caleb Vaughan  [id number]  11-Nov-1960  Date:10/13/21  Time:2:58 PM   Provider Performing:Pete E Tanja Port   Procedure: Tracheostomy Exchange Through immature Stoma (26712)  Indication(s) No longer need cuffed trach   Consent Risks of the procedure as well as the alternatives and risks of each were explained to the patient and/or caregiver.  Consent for the procedure was obtained and is signed in the bedside chart  Anesthesia None   Time Out Verified patient identification, verified procedure, site/side was marked, verified correct patient position, special equipment/implants available, medications/allergies/relevant history reviewed, required imaging and test results available.   Sterile Technique Hand hygiene, gloves   Procedure Description Size 6 proxima cuffed existing Shiley removed and size 6 uncuffed  proximal uncuffed Shiley placed through stoma.   Complications/Tolerance None; patient tolerated the procedure well..   EBL Minimal

## 2021-10-13 NOTE — Assessment & Plan Note (Signed)
Presented with significantly elevated blood pressure.  BP now controlled

## 2021-10-13 NOTE — Assessment & Plan Note (Addendum)
Uncontrolled.  Hemoglobin A1c of 9.7.   On Jardiance, metformin at home.   Currently on sliding scale insulin. CBGs poorly controlled with readings between 140s and 240s CBGs remain greater than 200 -increase Semglee to 8 units BID

## 2021-10-13 NOTE — Assessment & Plan Note (Addendum)
Secondary to stroke with left hemiparesis noted as well as lack of sensation on the left Continue PT and OT Plan for short-term rehabilitation and continued therapy at [facility] 2/15 begin out of bed to chair with [facility] lift

## 2021-10-13 NOTE — Assessment & Plan Note (Addendum)
Continued thick secretions limits PMSV tolerance Trach team following  SLP following.  Unable to tolerate PMSV as of 2/16 noted continues to cough out

## 2021-10-13 NOTE — Assessment & Plan Note (Addendum)
Unknown if on CPAP at home Currently has trach BMI 35.2

## 2021-10-13 NOTE — Assessment & Plan Note (Addendum)
CT imaging showed acute right thalamic intracerebral hemorrhage and intraventricular hemorrhage.     Patient was on Coumadin prior to admission and presented with hypertensive urgency MRI of the brain showed a stable right thalamic hemorrhage, regional edema, stable to moderate intraventricular hemorrhage with no ventriculomegaly. MRA showed chronic lacunar infarcts compatible with advanced small vessel disease.   Carotid Doppler showed 1-39% stenosis on left and right ICA. Echocardiogram showed EF of 62%.   LDL of 52. Hemoglobin A1c of 9.7. Intermittently agitated.    Continue Seroquel 50 mg at bedtime Cerebral edema stable as per MRI and follow-up CT scan was treated with hypertonic saline Communication improving he is able to write some with his right hand and communicate by nodding yes or no and does not appear to have any receptive aphasia or any expressive aphasia but difficult to accurately assess given trach.

## 2021-10-13 NOTE — Assessment & Plan Note (Signed)
Baseline creatinine 0.8-0.9 Current creatinine 1.38 with mildly elevated BUN Continue free water Not on any offending medication 2/14 begin normal saline at 100 cc/h and follow labs

## 2021-10-14 DIAGNOSIS — L89156 Pressure-induced deep tissue damage of sacral region: Secondary | ICD-10-CM | POA: Diagnosis not present

## 2021-10-14 DIAGNOSIS — Z930 Tracheostomy status: Secondary | ICD-10-CM | POA: Diagnosis not present

## 2021-10-14 DIAGNOSIS — I619 Nontraumatic intracerebral hemorrhage, unspecified: Secondary | ICD-10-CM | POA: Diagnosis not present

## 2021-10-14 DIAGNOSIS — R5381 Other malaise: Secondary | ICD-10-CM | POA: Diagnosis not present

## 2021-10-14 DIAGNOSIS — R197 Diarrhea, unspecified: Secondary | ICD-10-CM

## 2021-10-14 DIAGNOSIS — T148XXA Other injury of unspecified body region, initial encounter: Secondary | ICD-10-CM

## 2021-10-14 LAB — GLUCOSE, CAPILLARY
Glucose-Capillary: 172 mg/dL — ABNORMAL HIGH (ref 70–99)
Glucose-Capillary: 265 mg/dL — ABNORMAL HIGH (ref 70–99)
Glucose-Capillary: 279 mg/dL — ABNORMAL HIGH (ref 70–99)
Glucose-Capillary: 105 mg/dL — ABNORMAL HIGH (ref 70–99)
Glucose-Capillary: 196 mg/dL — ABNORMAL HIGH (ref 70–99)
Glucose-Capillary: 214 mg/dL — ABNORMAL HIGH (ref 70–99)

## 2021-10-14 LAB — CULTURE, BLOOD (ROUTINE X 2)
Culture: NO GROWTH
Culture: NO GROWTH
Special Requests: ADEQUATE
Special Requests: ADEQUATE

## 2021-10-14 LAB — CBC WITH DIFFERENTIAL/PLATELET
Abs Immature Granulocytes: 0.11 10*3/uL — ABNORMAL HIGH (ref 0.00–0.07)
Basophils Absolute: 0 10*3/uL (ref 0.0–0.1)
Basophils Relative: 0 %
Eosinophils Absolute: 0.3 10*3/uL (ref 0.0–0.5)
Eosinophils Relative: 2 %
HCT: 34.4 % — ABNORMAL LOW (ref 39.0–52.0)
Hemoglobin: 11.5 g/dL — ABNORMAL LOW (ref 13.0–17.0)
Immature Granulocytes: 1 %
Lymphocytes Relative: 8 %
Lymphs Abs: 1.2 10*3/uL (ref 0.7–4.0)
MCH: 30.4 pg (ref 26.0–34.0)
MCHC: 33.4 g/dL (ref 30.0–36.0)
MCV: 91 fL (ref 80.0–100.0)
Monocytes Absolute: 1.4 10*3/uL — ABNORMAL HIGH (ref 0.1–1.0)
Monocytes Relative: 10 %
Neutro Abs: 12 10*3/uL — ABNORMAL HIGH (ref 1.7–7.7)
Neutrophils Relative %: 79 %
Platelets: 377 10*3/uL (ref 150–400)
RBC: 3.78 MIL/uL — ABNORMAL LOW (ref 4.22–5.81)
RDW: 12.6 % (ref 11.5–15.5)
WBC: 15.1 10*3/uL — ABNORMAL HIGH (ref 4.0–10.5)
nRBC: 0 % (ref 0.0–0.2)

## 2021-10-14 LAB — BASIC METABOLIC PANEL WITH GFR
Anion gap: 9 (ref 5–15)
BUN: 24 mg/dL — ABNORMAL HIGH (ref 6–20)
CO2: 25 mmol/L (ref 22–32)
Calcium: 8.5 mg/dL — ABNORMAL LOW (ref 8.9–10.3)
Chloride: 105 mmol/L (ref 98–111)
Creatinine, Ser: 0.94 mg/dL (ref 0.61–1.24)
GFR, Estimated: >60 mL/min
Glucose, Bld: 307 mg/dL — ABNORMAL HIGH (ref 70–99)
Potassium: 3.7 mmol/L (ref 3.5–5.1)
Sodium: 139 mmol/L (ref 135–145)

## 2021-10-14 MED ORDER — COLLAGENASE 250 UNIT/GM EX OINT
TOPICAL_OINTMENT | Freq: Every day | CUTANEOUS | Status: DC
  Filled 2021-10-14: qty 30

## 2021-10-14 MED ORDER — INSULIN GLARGINE-YFGN 100 UNIT/ML ~~LOC~~ SOLN
5.0000 [IU] | Freq: Two times a day (BID) | SUBCUTANEOUS | Status: DC
  Administered 2021-10-14: 5 [IU] via SUBCUTANEOUS
  Filled 2021-10-14 (×5): qty 0.05

## 2021-10-14 MED ORDER — SACCHAROMYCES BOULARDII 250 MG PO CAPS
250.0000 mg | ORAL_CAPSULE | Freq: Two times a day (BID) | ORAL | Status: DC
  Administered 2021-10-14 – 2021-10-16 (×5): 250 mg
  Filled 2021-10-14 (×5): qty 1

## 2021-10-14 NOTE — Assessment & Plan Note (Signed)
Has been evaluated by wound care nurse Has DTI and upper sacrum that measures 0.5 x 0.5 cm Below this area has 2 unstageable pressure areas each 1 x 1 cm with 100% yellow base and moist. Plan is to apply Santyl to all of these wounds daily Please review [place] RN note for detail

## 2021-10-14 NOTE — Progress Notes (Signed)
CSW spoke with Caleb Vaughan at [employer] who states insurance authorization is still pending at this time.  Edwin Dada, MSW, LCSW Transitions of Care   Clinical Social Worker II [phone]

## 2021-10-14 NOTE — Progress Notes (Signed)
Occupational Therapy Treatment Patient Details Name: Caleb Vaughan MRN: [id number] DOB: Dec 22, 1960 Today's Date: 10/14/2021   History of present illness Patient is a 61 y/o male admitted due to R thalamic hemorrhage, edema and trace leftward midline shift, intubated on admission, attempted extubation 1/29, re-intubated that evening.  Trach 1/31. Pt is s/p peg tube on 2/3. PMH positive for a-fib on coumadin, HTN, HLD, previous smoker, prior CVA, DM, OSA.   OT comments  Pt making good progress with OT goals this session. Pt alert and following 75%+ of simple commands this session, as well as speaking a few words with the PMV. Pt completed bed mobility to EOB with max A +2 and max- total A support for sitting balance due to pushing with RUE and poor truncal control. Pt then worked on standing x2 with max A +3 in stedy and tolerated transfer to chair. Overall, pt cognition, participation, and strength is improving. D/C recommendations updated to long term acute care with OT, due to pt continuing to require increased medical care and unable to tolerate intensive rehab yet. OT will continue to follow and assist pt in progressing all OT goals.    Recommendations for follow up therapy are one component of a multi-disciplinary discharge planning process, led by the attending physician.  Recommendations may be updated based on patient status, additional functional criteria and insurance authorization.    Follow Up Recommendations  OT at Long-term acute care hospital    Assistance Recommended at Discharge Frequent or constant Supervision/Assistance  Patient can return home with the following  Two people to help with walking and/or transfers;Two people to help with bathing/dressing/bathroom;Assistance with cooking/housework;Assistance with feeding;Direct supervision/assist for medications management;Direct supervision/assist for financial management;Assist for transportation;Help with stairs or ramp for  entrance   Equipment Recommendations  BSC/3in1;Wheelchair (measurements OT);Wheelchair cushion (measurements OT)    Recommendations for Other Services      Precautions / Restrictions Precautions Precautions: Fall Precaution Comments: trach 10L/60%, FiO2,  R wrist restraint, peg tube recently inappropriately removed by patient. Restrictions Weight Bearing Restrictions: No       Mobility Bed Mobility Overal bed mobility: Needs Assistance Bed Mobility: Rolling, Sidelying to Sit Rolling: Max assist, +2 for physical assistance Sidelying to sit: Max assist, +2 for physical assistance       General bed mobility comments: required assistance for rolling and sit up. Pt. more able to help with positioning use R U/LE    Transfers Overall transfer level: Needs assistance Equipment used: Ambulation equipment used Transfers: Bed to chair/wheelchair/BSC Sit to Stand: Total assist, +2 physical assistance           General transfer comment: Pt. able to reach EOB and show improved righting ability. He was able to reach forward and pull to stand max +3 and maintain for a few seconds. Trasnfer done via steady. Transfer via Lift Equipment: Stedy   Balance Overall balance assessment: Needs assistance Sitting-balance support: Single extremity supported, Feet supported Sitting balance-Leahy Scale: Poor Sitting balance - Comments: decreased amount of pushing and pt. able to help correct seated positing using bed rails or OT/PT hands, able to sit with BUE in lap for short periods of time Postural control: Left lateral lean, Posterior lean (Pushing) Standing balance support: Bilateral upper extremity supported, Reliant on assistive device for balance Standing balance-Leahy Scale: Zero Standing balance comment: Stood in stedy with +3 assist, wouldbe unable to maintain standing without max-total assist.  ADL either performed or assessed with clinical  judgement   ADL Overall ADL's : Needs assistance/impaired     Grooming: Oral care;Wash/dry face;Minimal assistance;Min guard;Sitting Grooming Details (indicate cue type and reason): Pt washed his face with set up, needed min A for oral care, completed in sitting, which required mod-total A sitting EOB for balance                 Toilet Transfer: Total assistance;+2 for physical assistance;+2 for safety/equipment Toilet Transfer Details (indicate cue type and reason): +3 sit<>stand use of stedy for transfer to recliner, simulating transfer to bsc           General ADL Comments: Session focused on sitting balance, command following, and transfer to recliner, as well as positioing once in recliner.    Extremity/Trunk Assessment              Vision       Perception     Praxis      Cognition Arousal/Alertness: Awake/alert Behavior During Therapy: Restless Overall Cognitive Status: Impaired/Different from baseline Area of Impairment: Attention, Following commands, Safety/judgement                   Current Attention Level: Sustained   Following Commands: Follows one step commands consistently, Follows multi-step commands with increased time Safety/Judgement: Decreased awareness of safety, Decreased awareness of deficits   Problem Solving: Requires tactile cues, Requires verbal cues, Difficulty sequencing          Exercises Exercises: Other exercises Other Exercises Other Exercises: Tracking with eyes and reaching forward and across body with RUE. Other Exercises: PROM of LUE shoulder flexion to 90 degrees (due to 1 finger width subluxation), full elbow and hand ROM provided by OT    Shoulder Instructions       General Comments VSS on trach collar, RN notified about need of suctioning    Pertinent Vitals/ Pain       Pain Assessment Pain Assessment: Faces Faces Pain Scale: Hurts a little bit Pain Location: stomach (BM), back (notified via  gestures) Pain Descriptors / Indicators: Discomfort, Grimacing Pain Intervention(s): Limited activity within patient's tolerance, Monitored during session, Repositioned  Home Living                                          Prior Functioning/Environment              Frequency  Min 2X/week        Progress Toward Goals  OT Goals(current goals can now be found in the care plan section)  Progress towards OT goals: Progressing toward goals  Acute Rehab OT Goals Patient Stated Goal: To sit up for a little while OT Goal Formulation: Patient unable to participate in goal setting Time For Goal Achievement: 10/28/21 Potential to Achieve Goals: Good ADL Goals Pt Will Perform Grooming: with set-up;sitting;with supervision Pt Will Perform Upper Body Bathing: with min assist;sitting Pt Will Perform Lower Body Bathing: with mod assist;sit to/from stand Pt Will Transfer to Toilet: with mod assist;with +2 assist;bedside commode Additional ADL Goal #1: MAintain midline postural control with S to increase independence with ADL and mobility  Plan Discharge plan remains appropriate    Co-evaluation    PT/OT/SLP Co-Evaluation/Treatment: Yes Reason for Co-Treatment: Complexity of the patient's impairments (multi-system involvement);For patient/therapist safety;To address functional/ADL transfers PT goals addressed during session: Mobility/safety with mobility;Strengthening/ROM;Balance OT  goals addressed during session: ADL's and self-care;Strengthening/ROM      AM-PAC OT "6 Clicks" Daily Activity     Outcome Measure   Help from another person eating meals?: Total Help from another person taking care of personal grooming?: A Lot Help from another person toileting, which includes using toliet, bedpan, or urinal?: Total Help from another person bathing (including washing, rinsing, drying)?: A Lot Help from another person to put on and taking off regular upper body  clothing?: A Lot Help from another person to put on and taking off regular lower body clothing?: Total 6 Click Score: 9    End of Session Equipment Utilized During Treatment: Oxygen  OT Visit Diagnosis: Unsteadiness on feet (R26.81);Other abnormalities of gait and mobility (R26.89);Muscle weakness (generalized) (M62.81);Other symptoms and signs involving the nervous system (R29.898);Other symptoms and signs involving cognitive function;Hemiplegia and hemiparesis Hemiplegia - Right/Left: Left Hemiplegia - dominant/non-dominant: Non-Dominant Hemiplegia - caused by: Nontraumatic intracerebral hemorrhage   Activity Tolerance Patient tolerated treatment well   Patient Left in chair;with call bell/phone within reach;with chair alarm set   Nurse Communication Mobility status        Time: [id number] OT Time Calculation (min): 44 min  Charges: OT General Charges $OT Visit: 1 Visit OT Treatments $Self Care/Home Management : 8-22 mins  Mar Zettler H., OTR/L Acute Rehabilitation  Shuan Statzer Elane Arietta Eisenstein 10/14/2021, 3:28 PM

## 2021-10-14 NOTE — Progress Notes (Addendum)
Progress Note   Patient: Caleb Vaughan [id number] DOB: 20-Oct-1960 DOA: 09/20/2021     24 DOS: the patient was seen and examined on 10/14/2021   Brief hospital course: 61 year old male with history of A-fib on Coumadin, hyperlipidemia, hypertension, diabetes mellitus type 2, OSA, prior CVA who presented to the emergency department on 1/12 as code stroke.  He presented with left-sided weakness, dysarthria, right fixed gaze.  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  Neurology consulted.  Patient was intubated in the ED. patient was unable to wean off of mechanical ventilation so he was trached on 09/29/2021, PEG placement done on 10/02/2021.  Transferred to [facility] service on 10/05/2021.  Hospital course remarkable for persistent encephalopathy.  PT/OT recommending CIR but looks like he will not be able to participate with therapy anytime soon so the current plan is for [facility].  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     Patient was on Coumadin prior to admission and presented with hypertensive urgency MRI of the brain showed a stable right thalamic hemorrhage, regional edema, stable to moderate intraventricular hemorrhage with no ventriculomegaly. MRA showed chronic lacunar infarcts compatible with advanced small vessel disease.   Carotid Doppler showed 1-39% stenosis on left and right ICA. Echocardiogram showed EF of 62%.   LDL of 52. Hemoglobin A1c of 9.7. Intermittently agitated.  Started on Seroquel 50 mg at bedtime Cerebral edema stable as per MRI and follow-up CT scan was treated with hypertonic saline Communication improving he is able to write some with his right hand and communicate by nodding yes or no and does not appear to  have any receptive aphasia or any expressive aphasia but difficult to accurately assess given trach. 2/15 orders written for out of bed to chair with [facility] lift Has had Foley catheter in for 23 days so have discontinued, will place condom cath and monitor bladder scans every shift    Acute respiratory failure with hypoxia ([place]) Had to be intubated on presentation.  Extubated on 1/29 and had to be reintubated.   Now trach dependent.  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 but still requiring 60% FiO2 Continued thick secretions limits PMSV but patient has been tolerating Trach team following   Dysphagia following nontraumatic intracerebral hemorrhage Recent swallowing trial with PMV in place with documented coughing on 1 of 2 thin liquid trials.-Tolerated non thin liquids better without valve in place Patient unable to wear valve longer not for length of meal Reevaluated on 2/14 and noted with significant oropharyngeal dysphagia  Hypertension associated with diabetes ([place]) On amlodipine, Coreg, hydralazine and Norvasc Target blood pressures less than 160 mmHg  AKI (acute kidney injury) ([place]) Baseline creatinine 0.8-0.9 Current creatinine 1.38 with mildly elevated BUN Continue free water Not on any offending medication 2/14 begin normal saline at 100 cc/h and follow labs  Type 2 diabetes mellitus ([place]) Uncontrolled.  Hemoglobin A1c of 9.7.   On Jardiance, metformin at home.   Currently on sliding scale insulin. CBGs poorly controlled with readings between 140s and 240s CBGs remain greater than 200 so we will change Semglee to 5 units twice daily  Obstructive sleep  apnea/obesity- (present on admission) Unknown if on CPAP at home Currently has trach BMI 35.2  Physical deconditioning Secondary to stroke with left  hemiparesis noted as well as lack of sensation on the left Continue PT and OT Plan for short-term rehabilitation and continued therapy at [name] 2/15 begin out of bed to chair with [facility] lift  Leukocytosis Currently afebrile. One of the blood cultures showed Staph epidermidis: Most likely contamination.  Repeat blood cultures have been negative.   Fever was most likely secondary to intracerebral hemorrhage. He completed the course of Rocephin. Tracheal aspirate is showing gram-negative rods, gram-positive cocci in pairs, no staph or Pseudomonas.  Since leukocytosis was worsening,  restarted ceftriaxone.chest x-ray showed mild patchy airspace opacities at the bases potentially atelectasis or infection.  Completed 5-day course of Rocephin on 2/5  Paroxysmal A-fib ([place])- (present on admission) Currently not on anticoagulation due to ICH-on warfarin prior to admission Continue flecainide  Deep tissue injury sacrum Has been evaluated by wound care nurse Has DTI and upper sacrum that measures 0.5 x 0.5 cm Below this area has 2 unstageable pressure areas each 1 x 1 cm with 100% yellow base and moist. Plan is to apply Santyl to all of these wounds daily Please review [place] RN note for detail  Diarrhea Diarrhea without apparent abdominal pain and no fevers or leukocytosis We will stop all laxatives and stool softeners that are available as needed for now Does have as needed Imodium available Add Florastor Continue Neutra source fiber pack  Hypertensive emergency Presented with significantly elevated blood pressure.  BP now controlled        Subjective:  Awake and makes eye contact when spoken to.  Will follow some simple commands.  Does not attempt to vocalize.  Physical Exam: Vitals:   10/14/21 0729 10/14/21 0756 10/14/21 1126 10/14/21 1127  BP:  (!) 145/61 (!) 179/82   Pulse:  100 93 93  Resp: (!) 23 (!) 23 (!) 25   Temp:  98.7 F (37.1 C) 99.5 F (37.5 C)   TempSrc:  Oral Oral    SpO2: 91%     Weight:      Height:       General: Awake but somewhat sedated appearing male, unable to follow commands but still requiring wrist restraints Pulmonary: 6.0 cuffless [place] trach, FiO2 60% anterior lung sounds coarse with wet sounding cough.  Increased work of breathing at rest-thick secretions requiring frequent suction Cardiac: S1-S2, normotensive, no peripheral edema except for some focal edema in LUE in context of hemiparesis Abdomen: Soft nontender nondistended with normoactive bowel sounds.  PEG tube in place with tube feeding infusing. LBM 2/13 Genitourinary: 14 French Foley to urinary collection bag-has been in place 22-day Musculoskeletal: Flaccid left side.  No hypertonicity Neurological: Nerves II through XII appear to be grossly intact but exam limited by current neurological status and requirement for tracheostomy tube.  Flaccid on left side and patient able to relate that he is insensate on the left.  Moves right upper extremity well requiring wrist restraint strength appears to be about 4/5 Psychiatric: Awake and does follow simple commands  Data Reviewed: Results have been reviewed and noted in the problem list  Family Communication:  Patient only  Medically stable No unless discharged to [place] vaccination status:  [place] 12/06/2019 and 12/27/2019  Consultants: PCCM General surgery Procedures: Echocardiogram PEG tube per GI Antibiotics: Azithromycin x1 dose 1/22 Ceftriaxone x1 dose 1/22 Ceftriaxone 2/1 through 2/5   Disposition: Remains inpatient  appropriate because:  Continues to require oxygen at 60%, frequent suctioning and has an unsafe discharge plan until insurance authorizes placement in LTAC   A physical therapy consult is indicated based on the patients mobility assessment.   Mobility Assessment (last 72 hours)     Mobility Assessment     Row Name 10/14/21 1241 10/14/21 1000 10/13/21 2011   Does patient have an order for  bedrest or is patient medically unstable -- No - Continue assessment No - Continue assessment   What is the highest level of mobility based on the progressive mobility assessment? Level 2 ([place]) - Balance while sitting on edge of bed and cannot stand Level 2 ([place]) - Balance while sitting on edge of bed and cannot stand Level 2 ([place]) - Balance while sitting on edge of bed and cannot stand   Is the above level different from baseline mobility prior to current illness? -- Yes - Recommend PT order Yes - Recommend PT order    Row Name 10/13/21 0754 10/12/21 2000 10/12/21 1200   Does patient have an order for bedrest or is patient medically unstable No - Continue assessment No - Continue assessment --   What is the highest level of mobility based on the progressive mobility assessment? Level 2 ([place]) - Balance while sitting on edge of bed and cannot stand Level 2 ([place]) - Balance while sitting on edge of bed and cannot stand Level 2 (Chair[place]) - Balance while sitting on edge of bed and cannot stand   Is the above level different from baseline mobility prior to current illness? Yes - Recommend PT order Yes - Recommend PT order --    [place] Name 10/12/21 1126 10/12/21 0825 10/11/21 2000   Does patient have an order for bedrest or is patient medically unstable -- No - Continue assessment No - Continue assessment   What is the highest level of mobility based on the progressive mobility assessment? Level 2 ([place]) - Balance while sitting on edge of bed and cannot stand Level 2 ([place]) - Balance while sitting on edge of bed and cannot stand Level 1 (Bedfast) - Unable to balance while sitting on edge of bed   Is the above level different from baseline mobility prior to current illness? -- Yes - Recommend PT order Yes - Recommend PT order    Row Name 10/11/21 1400       Does patient have an order for bedrest or is patient medically unstable No - Continue assessment     What is  the highest level of mobility based on the progressive mobility assessment? Level 1 (Bedfast) - Unable to balance while sitting on edge of bed     Is the above level different from baseline mobility prior to current illness? Yes - Recommend PT order             Planned Discharge Destination:  LTAC      Time spent: 35 minutes  Author: Erin Hearing, NP 10/14/2021 1:55 PM  For on call review [email or web address].

## 2021-10-14 NOTE — Progress Notes (Signed)
Physical Therapy Treatment Patient Details Name: Caleb Vaughan MRN: [id number] DOB: [birth year] Today's Date: 10/14/2021   History of Present Illness Patient is a 61 y/o male admitted due to R thalamic hemorrhage, edema and trace leftward midline shift, intubated on admission, attempted extubation 1/29, re-intubated that evening.  Trach 1/31. Pt is s/p peg tube on 2/3. PMH positive for a-fib on coumadin, HTN, HLD, previous smoker, prior CVA, DM, OSA.    PT Comments    Focus of session today functional bed mobility, sitting balance, ADL practice, stand with steady, and transfer to chair. The patient tolerated well and reports mild fatigue afterwards.  Pt. Shows overall improvement with processing, initiation for transfer, his ability to assist, and corrective sitting balance. Overall L extremity function, balance, and righting mechanisms are still limiting function. Pt. Would benefit from skilled PT to continue to address these deficits and continue to progress mobility as tolerated. Plan and discharge setting remains unchanged. Pt to follow acutely as appropriate.     Recommendations for follow up therapy are one component of a multi-disciplinary discharge planning process, led by the attending physician.  Recommendations may be updated based on patient status, additional functional criteria and insurance authorization.  Follow Up Recommendations  PT at Long-term acute care hospital     Assistance Recommended at Discharge Frequent or constant Supervision/Assistance  Patient can return home with the following Two people to help with walking and/or transfers;Two people to help with bathing/dressing/bathroom;Assistance with cooking/housework;Assistance with feeding;Direct supervision/assist for medications management;Direct supervision/assist for financial management;Assist for transportation;Help with stairs or ramp for entrance   Equipment Recommendations  Wheelchair (measurements  PT);Wheelchair cushion (measurements PT);Hospital bed    Recommendations for Other Services       Precautions / Restrictions Precautions Precautions: Fall Precaution Comments: trach 10L/60%, FiO2,  R wrist restraint, peg tube recently inappropriately removed by patient. Restrictions Weight Bearing Restrictions: No     Mobility  Bed Mobility Overal bed mobility: Needs Assistance Bed Mobility: Rolling, Sidelying to Sit Rolling: Max assist, +2 for physical assistance Sidelying to sit: Max assist, +2 for physical assistance       General bed mobility comments: required assistance for rolling and sit up. Pt. more able to help with positioning use R U/LE    Transfers Overall transfer level: Needs assistance   Transfers: Bed to chair/wheelchair/BSC Sit to Stand: Total assist, +2 physical assistance             Transfer via Lift Equipment: Stedy  Ambulation/Gait                   Stairs             Wheelchair Mobility    Modified Rankin (Stroke Patients Only) Modified Rankin (Stroke Patients Only) Pre-Morbid Rankin Score: No symptoms Modified Rankin: Severe disability     Balance Overall balance assessment: Needs assistance Sitting-balance support: No upper extremity supported, Feet supported Sitting balance-Leahy Scale: Poor Sitting balance - Comments: decreased amount of pushing and pt. able to help correct seated positing using bed rails or OT/PT hands Postural control: Other (comment), Left lateral lean, Posterior lean (pushing)                                  Cognition Arousal/Alertness: Awake/alert Behavior During Therapy: Restless Overall Cognitive Status: Impaired/Different from baseline Area of Impairment: Attention, Following commands, Safety/judgement  Current Attention Level: Sustained   Following Commands: Follows multi-step commands consistently Safety/Judgement: Decreased awareness of  safety, Decreased awareness of deficits   Problem Solving: Requires tactile cues, Requires verbal cues, Difficulty sequencing General Comments: more alert and kept eyes open.  Responded with gesters and spoke some with PMV. Quick responses to all commands        Exercises      General Comments General comments (skin integrity, edema, etc.): VSS on trach collar, RN notified about need of suctioning      Pertinent Vitals/Pain Pain Assessment Pain Assessment: Faces Faces Pain Scale: Hurts a little bit Pain Location: stomach (BM), back (notified via gestures) Pain Descriptors / Indicators: Discomfort, Grimacing Pain Intervention(s): Limited activity within patient's tolerance, Monitored during session, Repositioned    Home Living                          Prior Function            PT Goals (current goals can now be found in the care plan section) Acute Rehab PT Goals Patient Stated Goal: rehab post-acute PT Goal Formulation: With patient Time For Goal Achievement: 10/28/21 Potential to Achieve Goals: Fair Progress towards PT goals: Progressing toward goals    Frequency    Min 3X/week      PT Plan Current plan remains appropriate    Co-evaluation PT/OT/SLP Co-Evaluation/Treatment: Yes Reason for Co-Treatment: Complexity of the patient's impairments (multi-system involvement);For patient/therapist safety;To address functional/ADL transfers PT goals addressed during session: Mobility/safety with mobility;Strengthening/ROM;Balance OT goals addressed during session: ADL's and self-care;Strengthening/ROM      AM-PAC PT "6 Clicks" Mobility   Outcome Measure  Help needed turning from your back to your side while in a flat bed without using bedrails?: A Lot Help needed moving from lying on your back to sitting on the side of a flat bed without using bedrails?: A Lot Help needed moving to and from a bed to a chair (including a wheelchair)?: Total Help needed  standing up from a chair using your arms (e.g., wheelchair or bedside chair)?: Total Help needed to walk in hospital room?: Total Help needed climbing 3-5 steps with a railing? : Total 6 Click Score: 8    End of Session Equipment Utilized During Treatment: Oxygen Activity Tolerance: Patient tolerated treatment well Patient left: with restraints reapplied;with call bell/phone within reach;in chair;with chair alarm set Nurse Communication: Mobility status;Other (comment) (Plan to come back to help RN move back to bed, need for suctioning) PT Visit Diagnosis: Hemiplegia and hemiparesis;Other abnormalities of gait and mobility (R26.89);Muscle weakness (generalized) (M62.81) Hemiplegia - Right/Left: Left Hemiplegia - dominant/non-dominant: Non-dominant Hemiplegia - caused by: Nontraumatic intracerebral hemorrhage     Time: 1135-1220 PT Time Calculation (min) (ACUTE ONLY): 45 min  Charges:  $Therapeutic Activity: 8-22 mins $Neuromuscular Re-education: 8-22 mins                     Thermon Leyland, SPT Acute Rehab Services    Thermon Leyland 10/14/2021, 3:21 PM

## 2021-10-14 NOTE — Progress Notes (Signed)
STROKE TEAM PROGRESS NOTE   INTERVAL HISTORY Patient is seen leaving his room for swallow eval.  He has been hemodynamically stable and his neurological exam is unchanged.  He is follows commands on the right side but remains plegic on the left.  Vital signs are stable.  Continues to be on trach collar tolerating well.  He had PEG tube replaced yesterday by interventional radiology.  His son has signed consent form for participation in the ASPIRE stroke prevention study and patient was randomized y`day to Eliquis versus aspirin.  However it has been brought to my noticed that he has history of anaphylaxis with Eliquis hence he cannot participate in the study and was a screen failure.  Awaiting decision on insurance approval for LTAC  Vitals:   10/14/21 0729 10/14/21 0756 10/14/21 1126 10/14/21 1127  BP:  (!) 145/61 (!) 179/82   Pulse:  100 93 93  Resp: (!) 23 (!) 23 (!) 25   Temp:  98.7 F (37.1 C) 99.5 F (37.5 C)   TempSrc:  Oral Oral   SpO2: 91%     Weight:      Height:       CBC:  Recent Labs  Lab 10/11/21 0245 10/13/21 0339  WBC 12.1* 14.2*  NEUTROABS 8.9* 10.3*  HGB 10.7* 11.1*  HCT 33.4* 34.5*  MCV 93.0 93.2  PLT 445* [id number]*   Basic Metabolic Panel:  Recent Labs  Lab 10/09/21 0031 10/10/21 0103 10/13/21 0339 10/14/21 0837  NA 137   < > 140 139  K 3.7   < > 3.9 3.7  CL 103   < > 102 105  CO2 25   < > 25 25  GLUCOSE 141*   < > 209* 307*  BUN 25*   < > 31* 24*  CREATININE 0.92   < > 1.38* 0.94  CALCIUM 8.9   < > 9.1 8.5*  MG 2.5*  --   --   --    < > = values in this interval not displayed.   Lipid Panel:  No results for input(s): CHOL, TRIG, HDL, CHOLHDL, VLDL, LDLCALC in the last 168 hours.  HgbA1c: No results for input(s): HGBA1C in the last 168 hours.  Urine Drug Screen:  No results for input(s): LABOPIA, COCAINSCRNUR, LABBENZ, AMPHETMU, THCU, LABBARB in the last 168 hours.   Alcohol Level No results for input(s): ETH in the last 168 hours.  IMAGING  past 24 hours No results found.  PHYSICAL EXAM  Physical Exam  Constitutional: Obese middle-aged Caucasian male not in distress.  Status post tracheostomy Cardiovascular: Normal rate and regular rhythm.  Respiratory: Respirations even and unlabored on trach collar  Neuro: Awake and interactive.  Eyes closed, does open on voice, tracking to the right.  Left facial droop. Spontaneous movement of right UE and LE with purpose. Will follow commands with right hand, no movement of LUE and LLE with hypotonia and dense left hemiplegia.   ASSESSMENT/PLAN Caleb Vaughan is a 61 y.o. male with history of AFib on Coumadin s/p recent conversion, HTN, HLD, remote tobacco abuse, Cataract OU, glaucoma, obesity, CVA, DM II, and OSA who initially presented to the ED via EMS left sided weakness, dysarthria, and right fixed gaze. Coumadin reversed. Labetalol and cleviprex used for BP greater than 140 during CT. Patient was then intubated due to somnolence and inability to protect his airway. CT and MRI show stable right thalamic hemorrhage with intraventricular extension. ICH score 2. Febrile. Hypertonic saline discontinued 1/23.  Most recent sodium is 155.  Currently in NSR with flecainide continued. Holding home antihypertensive medications currently. No longer using cleviprex or norepinephrine (r/t hypotension after RSI) for BP control. Repeat CT shows no significant change in the hemorrhage or ventricle size. Weaning 1/28 and 1/29 at 30% , 5/5. Extubated 1/29 AM and reintubated 1/29 PM due to copious secretions.  Tracheostomy was placed 1/31. PEG tube placed 2/3. He remains on trach collar and is awaiting placement for rehab/LTACH.   Stroke:  Acute right thalamic ICH and IVH likely secondary to hypertension in the setting of coumadin use Code stroke CT - Positive for acute right thalamic hemorrhage with intraventricular extension. Estimated intra-axial blood volume 25 mL. Mild Regional brain edema. Trace  leftward midline shift. 1/22 Repeat CT shows no changes from code stroke CT MRI  Stable right thalamic hemorrhage since presentation. Regional edema, tracking into the midbrain. 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 No increased intracranial mass effect. MRA  Abundant chronic micro-hemorrhages in the brain superimposed on chronic lacunar infarcts compatible with advanced small vessel disease CT head 1/27 - unchanged hematoma and ventricular size Carotid Doppler  1-39% stenosis in Left and Right ICA 2D Echo EF 60-65% LDL 52 HgbA1c 9.7 VTE prophylaxis - heparin warfarin daily prior to admission, now on No antithrombotic due to [place] Therapy recommendations:  LTACH- [facility] evaluating patient. CIR signed off. Disposition:  pending  Cerebral Edema with left midline shift Stable MRI and repeat CT scan Hypertonic saline discontinued d/c Na serial checks Allow Na trending down gradually  Free water flushes given 200cc->400cc q4h->200 Q3h  Acute hypoxemic respiratory failure CCM on board Difficult intubation r/t tracheal stenosis Extubated 1/29 and reintubated Tracheostomy 1/31 PEG 2/3  Atrial Fibrillation Home meds: Flecainide, coreg, coumadin Rate controlled now on No antithrombotic due to [place] Recommend start aspirin 81 mg daily at discharge to [facility] Fever, improved Blood culture 1/2 staph epidermidis Repeat blood culture no growth Tmax 102.4->100.7 -> 100.4->100.6->afebrile WBC 11.4-> 12.1 completed rocephin course  Hypertension Home meds:  Amlodipine, coreg, losartan-hydrochlorothiazide BP goal less than 160 BP on the high end On coreg 25 bid and norvasc 10 Increase hydralazine to 100 Q8 Long term BP goal normotensive  Hyperlipidemia Home meds: Lipitor 80 LDL 52, goal < 70 Consider to resume Lipitor on discharge  Diabetes type II Uncontrolled Home meds:  Metformin, jardiance HgbA1c 9.7 , goal < 7.0 CBGs SSI On levemir Close PCP follow-up as  outpatient for better DM control  Dysphagia  PEG 2/3 On TF @ 50 and FW  Other Stroke Risk Factors Former smoker - quit 20 years ago Obesity, Body mass index is 34.74 kg/m., BMI >/= 30 associated with increased stroke risk, recommend weight loss, diet and exercise as appropriate  Obstructive sleep apnea   Hospital day # 24  Patient remains neurologically stable and PEG tube have been being replaced by interventional radiology.  Awaiting insurance approval for transfer to LTAC.Marland Kitchen His son  signed consent form for participation in the ASPIRE stroke prevention study and patient was  randomized y`day to Eliquis versus aspirin.  However it has been brought to my noticed that patient has anaphylaxis with Eliquis hence he would be a screen failure and will not participate in the study .  Recommend start aspirin 81 mg daily at the time of discharge to LTAC.  Greater than 50% time during this 35-minute visit was spent on counseling and coordination of care about the sublimation atrial fibrillation stroke prevention discussion and answering  questions.  Discussed with Dr. Sloan Leiter.  Stroke team will sign off.  Can call for questions Antony Contras, MD Medical Director [facility] Pager: [phone] 10/14/2021 1:41 PM    To contact Stroke Continuity provider, please refer to [email or web address] After hours, contact General Neurology

## 2021-10-14 NOTE — Consult Note (Addendum)
[place] Nurse Consult Note: Reason for Consult: Consult requested for sacrum.  Pt has a dark red-purple deep tissue pressure injury to upper sacrum; .5X.5cm Below this there are 2 Unstageable pressure injuries; each 1X1cm, 100% yellow and moist.  Pt is frequently incontinent of loose stool and it is difficult to keep the affected area from becoming soiled.  Pressure Injury POA: No Dressing procedure/placement/frequency: Topical treatment orders provided for bedside nurses to perform as follows to assist with removal of nonviable tissue: Apply Santyl to 3 wounds on sacrum Q day, then cover with moist gauze and medium foam dressing, to avoid stool pooling underneath.  (Change foam dressing Q 3 days or PRN soiling.) WOC team will assess the location weekly to determine if a change in the plan of care is indicated at that time.  Julien Girt MSN, RN, [place], [place], [place]

## 2021-10-14 NOTE — Assessment & Plan Note (Addendum)
All prn laxatives and stool softeners were discontinued.   Clinical signs consistent with C. difficile Does have as needed Imodium available Continue Florastor and Nutri source fiber pack

## 2021-10-15 DIAGNOSIS — R5381 Other malaise: Secondary | ICD-10-CM | POA: Diagnosis not present

## 2021-10-15 DIAGNOSIS — Z930 Tracheostomy status: Secondary | ICD-10-CM | POA: Diagnosis not present

## 2021-10-15 LAB — URINALYSIS, ROUTINE W REFLEX MICROSCOPIC
Bilirubin Urine: NEGATIVE
Glucose, UA: >=500 mg/dL — AB
Hgb urine dipstick: NEGATIVE
Ketones, ur: NEGATIVE mg/dL
Nitrite: NEGATIVE
Protein, ur: 30 mg/dL — AB
Specific Gravity, Urine: 1.012 (ref 1.005–1.030)
pH: 5 (ref 5.0–8.0)

## 2021-10-15 LAB — GLUCOSE, CAPILLARY
Glucose-Capillary: 206 mg/dL — ABNORMAL HIGH (ref 70–99)
Glucose-Capillary: 225 mg/dL — ABNORMAL HIGH (ref 70–99)
Glucose-Capillary: 238 mg/dL — ABNORMAL HIGH (ref 70–99)
Glucose-Capillary: 145 mg/dL — ABNORMAL HIGH (ref 70–99)
Glucose-Capillary: 230 mg/dL — ABNORMAL HIGH (ref 70–99)
Glucose-Capillary: 191 mg/dL — ABNORMAL HIGH (ref 70–99)

## 2021-10-15 MED ORDER — JUVEN PO PACK
1.0000 | PACK | Freq: Two times a day (BID) | ORAL | Status: DC
Start: 2021-10-15 — End: 2021-10-16
  Administered 2021-10-15 – 2021-10-16 (×3): 1
  Filled 2021-10-15 (×3): qty 1

## 2021-10-15 MED ORDER — INSULIN GLARGINE-YFGN 100 UNIT/ML ~~LOC~~ SOLN
8.0000 [IU] | Freq: Two times a day (BID) | SUBCUTANEOUS | Status: DC
  Administered 2021-10-15 – 2021-10-16 (×3): 8 [IU] via SUBCUTANEOUS
  Filled 2021-10-15 (×4): qty 0.08

## 2021-10-15 NOTE — Progress Notes (Signed)
Speech Language Pathology Treatment: Dysphagia Patient Details  Name: Caleb Vaughan MRN: [id number] DOB: [birth year] Today's Date: 10/15/2021 Time: 1007-1219 SLP Time Calculation (min) (ACUTE ONLY): 14 min  Assessment / Plan / Recommendation Clinical Impression  Treatment today focused primarily on dysphagia as Caleb Vaughan was not able to leave PMV in place without coughing and increased WOB. Suspect back pressure upon valve removal. Caleb Vaughan was given trials of nectar thick liquids by spoon as was recommended from MBS. He appeared to have variable timing of oral transit vs swallow initiation, with up to 20 seconds noted from the time of bolus presentation to palpable swallow. On average, this time was closer to 10 seconds. This is significantly improved from MBS, during which he had no aspiration but also was given minimal boluses due to his more notable delay. If Caleb Vaughan is able to more consistently produce what appears to be a faster swallow responses, he may be able to initiate at least snacks vs diet, but he may also benefit from repeat MBS to more thoroughly challenge him.    HPI HPI: Caleb Vaughan is a 61 y/o male who presented with left sided weakness, dysarthria, and right fixed gaze. Imaging revealed R thalamic hemorrhage, edema and trace leftward midline shift. Caleb Vaughan intubated in ED for increasing somnolence and concerns regarding airway protection. ETT 1/22-1/29 failed extubation due to secretions; reintubated 1/29-trach 1/31. PMH: A-fib on coumadin, HTN, HLD, previous smoker, prior CVA, DM, OSA.      SLP Plan  Continue with current plan of care      Recommendations for follow up therapy are one component of a multi-disciplinary discharge planning process, led by the attending physician.  Recommendations may be updated based on patient status, additional functional criteria and insurance authorization.    Recommendations  Diet recommendations: NPO Medication Administration: Via alternative means      Patient may  use Passy-Muir Speech Valve: with SLP only PMSV Supervision: Full MD: Please consider changing trach tube to : Smaller size;Cuffless         Oral Care Recommendations: Oral care QID Follow Up Recommendations: Skilled nursing-short term rehab (<3 hours/day) Assistance recommended at discharge: Frequent or constant Supervision/Assistance SLP Visit Diagnosis: Dysphagia, oropharyngeal phase (R13.12) Plan: Continue with current plan of care           Mahala Menghini., M.A. CCC-SLP Acute Rehabilitation Services Pager [phone] Office [phone]  10/15/2021, 2:46 PM

## 2021-10-15 NOTE — Progress Notes (Signed)
Progress Note   Patient: Caleb Vaughan UXL:[id number] DOB: [birth year] DOA: 09/20/2021     25 DOS: the patient was seen and examined on 10/15/2021   Brief hospital course: 61 year old male with history of A-fib on Coumadin, hyperlipidemia, hypertension, diabetes mellitus type 2, OSA, prior CVA who presented to the emergency department on 1/12 as code stroke.  He presented with left-sided weakness, dysarthria, right fixed gaze.  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  Neurology consulted.  Patient was intubated in the ED. patient was unable to wean off of mechanical ventilation so he was trached on 09/29/2021, PEG placement done on 10/02/2021.  Transferred to [facility] service on 10/05/2021.  Hospital course remarkable for persistent encephalopathy.  PT/OT recommending CIR but looks like he will not be able to participate with therapy anytime soon so the current plan is for [facility].  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     Patient was on Coumadin prior to admission and presented with hypertensive urgency MRI of the brain showed a stable right thalamic hemorrhage, regional edema, stable to moderate intraventricular hemorrhage with no ventriculomegaly. MRA showed chronic lacunar infarcts compatible with advanced small vessel disease.   Carotid Doppler showed 1-39% stenosis on left and right ICA. Echocardiogram showed EF of 62%.   LDL of 52. Hemoglobin A1c of 9.7. Intermittently agitated.  Started on Seroquel 50 mg at bedtime Cerebral edema stable as per MRI and follow-up CT scan was treated with hypertonic saline Communication improving he is able to write some with his right hand and communicate by nodding yes or no and does not appear to  have any receptive aphasia or any expressive aphasia but difficult to accurately assess given trach. 2/15 orders written for out of bed to chair with [facility] lift Foley catheter had been in place for 23 days so  dcd 2 15 but had > 400 cc on FU bladder scan so foley replaced    Acute respiratory failure with hypoxia (HCC) Had to be intubated on presentation.  Extubated on 1/29 and had to be reintubated.   Now trach dependent.  O2 needs have decreased from 60% to 40%.  We will continue to follow.  Secretions have also diminished      Tracheostomy dependence (HCC) Stable but still requiring 60% FiO2 Continued thick secretions limits PMSV but patient has been tolerating Trach team following   Dysphagia following nontraumatic intracerebral hemorrhage Recent swallowing trial with PMV in place with documented coughing on 1 of 2 thin liquid trials.-Tolerated non thin liquids better without valve in place Patient unable to wear valve longer not for length of meal Reevaluated on 2/14 and noted with significant oropharyngeal dysphagia  Hypertension associated with diabetes (HCC) On amlodipine, Coreg, hydralazine and Norvasc Target blood pressures less than 160 mmHg  AKI (acute kidney injury) (HCC) Baseline creatinine 0.8-0.9 Current creatinine 1.38 with mildly elevated BUN Continue free water Not on any offending medication 2/14 begin normal saline at 100 cc/h and follow labs  Type 2 diabetes mellitus (HCC) Uncontrolled.  Hemoglobin A1c of 9.7.   On Jardiance, metformin at home.   Currently on sliding scale insulin. CBGs poorly controlled with readings between 140s and 240s CBGs remain greater than 200 -increase Semglee to 8 units BID  Obstructive sleep apnea/obesity- (present on admission) Unknown if  on CPAP at home Currently has trach BMI 35.2  Physical deconditioning Secondary to stroke with left hemiparesis noted as well as lack of sensation on the left Continue PT and OT Plan  for short-term rehabilitation and continued therapy at [facility] 2/15 begin out of bed to chair with [facility] lift  Leukocytosis Currently afebrile. One of the blood cultures showed Staph epidermidis: Most likely contamination.  Repeat blood cultures have been negative.   Fever was most likely secondary to intracerebral hemorrhage. He completed the course of Rocephin. Tracheal aspirate is showing gram-negative rods, gram-positive cocci in pairs, no staph or Pseudomonas.  Since leukocytosis was worsening,  restarted ceftriaxone.chest x-ray showed mild patchy airspace opacities at the bases potentially atelectasis or infection.  Completed 5-day course of Rocephin on 2/5 Intermittent low grade fevers- with need for foley will check UA/cx to r/o UTI  Paroxysmal A-fib (HCC)- (present on admission) Currently not on anticoagulation due to ICH-on warfarin prior to admission Continue flecainide  Deep tissue injury sacrum Has been evaluated by wound care nurse Has DTI and upper sacrum that measures 0.5 x 0.5 cm Below this area has 2 unstageable pressure areas each 1 x 1 cm with 100% yellow base and moist. Plan is to apply Santyl to all of these wounds daily Please review WOC RN note for detail  Diarrhea We will stop all prn laxatives and stool softeners.  Does have low-grade fever in the 99 range and recently completed 5 days of Rocephin so if symptoms persist, diarrhea becomes watery or patient develops abdominal pain may need to check for C. difficile Does have as needed Imodium available Add Florastor Continue Neutra source fiber pack  Hypertensive emergency Presented with significantly elevated blood pressure.  BP now controlled        Subjective:  Awake.  Makes appropriate eye contact and follows simple commands by squeezing hand.  Remains drowsy.  Denied that he was uncomfortable w/ current positioning.  Physical Exam: Vitals:   10/15/21 0438 10/15/21 0527 10/15/21 0749 10/15/21 0800   BP: (!) 149/69 (!) 159/72 (!) 156/63 (!) 156/63  Pulse: 83 84 88 82  Resp: 19  16 16   Temp:   98.3 F (36.8 C)   TempSrc:   Axillary   SpO2: 96%  96% 96%  Weight:      Height:       General: Awake, no acute distress Pulmonary: 6.0 cuffless Shiley trach, FiO2 has been decreased to 60% anterior lung sounds coarse -decrease in secretions Cardiac: S1-S2, normotensive, no peripheral edema except for some focal edema in LUE in context of hemiparesis Abdomen: Soft nontender nondistended with normoactive bowel sounds.  PEG tube in place with tube feeding infusing. LBM 2/15 Genitourinary: Foley reinserted yesterday afternoon on 2/15 Musculoskeletal: Flaccid left side.  No hypertonicity Neurological: Nerves II through XII appear to be grossly intact but exam limited by current neurological status and requirement for tracheostomy tube.  Flaccid on left side and patient able to relate that he is insensate on the left.  Moves right upper extremity well requiring wrist restraint strength appears to be about 4/5 Psychiatric: Awake and does follow simple commands  Data Reviewed: Results have been reviewed and noted in the problem list  Family Communication:  Patient only  Medically stable No unless discharged to LTAC  COVID vaccination status:  Pfizer 12/06/2019 and 12/27/2019  Consultants: PCCM General surgery Procedures: Echocardiogram PEG tube per GI Antibiotics: Azithromycin x1 dose 1/22 Ceftriaxone x1 dose 1/22 Ceftriaxone 2/1 through 2/5  Disposition: Remains inpatient appropriate because:  Continues to require oxygen at 60%, frequent suctioning and has an unsafe discharge plan until insurance authorizes placement in LTAC   A physical therapy consult is indicated based on the patients mobility assessment.   Mobility Assessment (last 72 hours)     Mobility Assessment     Row Name 10/14/21 2203 10/14/21 1500 10/14/21 1241   Does patient have an order for bedrest or is  patient medically unstable No - Continue assessment -- --   What is the highest level of mobility based on the progressive mobility assessment? Level 2 (Chairfast) - Balance while sitting on edge of bed and cannot stand Level 2 (Chairfast) - Balance while sitting on edge of bed and cannot stand Level 2 (Chairfast) - Balance while sitting on edge of bed and cannot stand   Is the above level different from baseline mobility prior to current illness? Yes - Recommend PT order -- --    Row Name 10/14/21 1000 10/13/21 2011 10/13/21 0754   Does patient have an order for bedrest or is patient medically unstable No - Continue assessment No - Continue assessment No - Continue assessment   What is the highest level of mobility based on the progressive mobility assessment? Level 2 (Chairfast) - Balance while sitting on edge of bed and cannot stand Level 2 (Chairfast) - Balance while sitting on edge of bed and cannot stand Level 2 (Chairfast) - Balance while sitting on edge of bed and cannot stand   Is the above level different from baseline mobility prior to current illness? Yes - Recommend PT order Yes - Recommend PT order Yes - Recommend PT order    Row Name 10/12/21 2000 10/12/21 1200 10/12/21 1126   Does patient have an order for bedrest or is patient medically unstable No - Continue assessment -- --   What is the highest level of mobility based on the progressive mobility assessment? Level 2 (Chairfast) - Balance while sitting on edge of bed and cannot stand Level 2 (Chairfast) - Balance while sitting on edge of bed and cannot stand Level 2 (Chairfast) - Balance while sitting on edge of bed and cannot stand   Is the above level different from baseline mobility prior to current illness? Yes - Recommend PT order -- --           Planned Discharge Destination:  LTAC      Time spent: 35 minutes  Author: Junious Silk, NP 10/15/2021 11:05 AM  For on call review [email or web address].

## 2021-10-15 NOTE — Progress Notes (Signed)
Nutrition Follow-up  DOCUMENTATION CODES:   Obesity unspecified  INTERVENTION:  Continue TF via PEG:  Jevity 1.5 @ 50 ml/hr (1200 ml/day) Prosource TF 90 ml BID   Provides 1960 kcal, 119 gm protein, 912 ml free water daily   200 ml free water every 3 hours  Total free water: 2512 ml   -add 1 packet Juven BID per tube, each packet provides 95 calories, 2.5 grams of protein (collagen), and 9.8 grams of carbohydrate (3 grams sugar); also contains 7 grams of L-arginine and L-glutamine, 300 mg vitamin C, 15 mg vitamin E, 1.2 mcg vitamin B-12, 9.5 mg zinc, 200 mg calcium, and 1.5 g  Calcium Beta-hydroxy-Beta-methylbutyrate to support wound healing   NUTRITION DIAGNOSIS:   Inadequate oral intake related to inability to eat as evidenced by NPO status.  ongoing  GOAL:   Patient will meet greater than or equal to 90% of their needs  Met with TF   MONITOR:   TF tolerance, Weight trends  REASON FOR ASSESSMENT:   Consult, Ventilator Enteral/tube feeding initiation and management  ASSESSMENT:   Pt with PMH of Afib on coumadin, HTN, HLD, previous smoker, previous CVA, DM, OSA now admitted with R thalamic bleed with intraventricular extension.  1/23 s/p cortrak placement; per xray tip in pyloric bulb  1/31 s/p trach  2/03 s/p EGD and PEG placed 2/06 tx to [facility] service 2/13 PEG replaced d/t pt pulling 2/14 trach change  Per MD, pt continues to await placement in LTACH. Pt resting at time of RD visit. Continues to tolerate TF via PEG. Current TF: Jevity 1.5 @ 50 with 40m prosource TF BID and 2041mfree water Q3H   UOP: 22503m24 hours I/O: +633m8mnce admit  Current weight: 91.8 kg Admit weight: 93 kg   Medications: nutrisource fiber, SSI Q4H, 8 units semglee BID, florastor, protonix  Labs reviewed.  CBGs: [phone]   Diet Order:   Diet Order             Diet NPO time specified  Diet effective midnight                   EDUCATION NEEDS:   No  education needs have been identified at this time  Skin:  Skin Assessment: Skin Integrity Issues: Skin Integrity Issues:: Unstageable Unstageable: coccyx  Last BM:  2/15 type 6  Height:   Ht Readings from Last 1 Encounters:  09/20/21 _0  (1.626 m)    Weight:   Wt Readings from Last 1 Encounters:  10/13/21 91.8 kg    BMI:  Body mass index is 34.74 kg/m.  Estimated Nutritional Needs:   Kcal:  1800-2000  Protein:  110-120 grams  Fluid:  >1.8L /day     AmanTheone StanleyS, RD, LDN (she/her/hers) RD pager number and weekend/on-call pager number located in [phone]

## 2021-10-16 ENCOUNTER — Inpatient Hospital Stay
Admission: AD | Admit: 2021-10-16 | Discharge: 2021-12-02 | Disposition: A | Discharge status: Skilled Nursing Facility | Payer: No Typology Code available for payment source | Source: Other Acute Inpatient Hospital

## 2021-10-16 ENCOUNTER — Other Ambulatory Visit (HOSPITAL_COMMUNITY): Payer: Self-pay

## 2021-10-16 DIAGNOSIS — J690 Pneumonitis due to inhalation of food and vomit: Secondary | ICD-10-CM

## 2021-10-16 DIAGNOSIS — N179 Acute kidney failure, unspecified: Secondary | ICD-10-CM

## 2021-10-16 DIAGNOSIS — J189 Pneumonia, unspecified organism: Secondary | ICD-10-CM

## 2021-10-16 DIAGNOSIS — D72829 Elevated white blood cell count, unspecified: Secondary | ICD-10-CM

## 2021-10-16 DIAGNOSIS — J9690 Respiratory failure, unspecified, unspecified whether with hypoxia or hypercapnia: Secondary | ICD-10-CM

## 2021-10-16 DIAGNOSIS — J398 Other specified diseases of upper respiratory tract: Secondary | ICD-10-CM

## 2021-10-16 DIAGNOSIS — R338 Other retention of urine: Secondary | ICD-10-CM

## 2021-10-16 DIAGNOSIS — J9601 Acute respiratory failure with hypoxia: Secondary | ICD-10-CM | POA: Diagnosis present

## 2021-10-16 DIAGNOSIS — I619 Nontraumatic intracerebral hemorrhage, unspecified: Secondary | ICD-10-CM | POA: Diagnosis present

## 2021-10-16 DIAGNOSIS — Z431 Encounter for attention to gastrostomy: Secondary | ICD-10-CM

## 2021-10-16 DIAGNOSIS — J9600 Acute respiratory failure, unspecified whether with hypoxia or hypercapnia: Secondary | ICD-10-CM

## 2021-10-16 DIAGNOSIS — Z931 Gastrostomy status: Secondary | ICD-10-CM

## 2021-10-16 DIAGNOSIS — N390 Urinary tract infection, site not specified: Secondary | ICD-10-CM

## 2021-10-16 DIAGNOSIS — I480 Paroxysmal atrial fibrillation: Secondary | ICD-10-CM | POA: Diagnosis present

## 2021-10-16 DIAGNOSIS — R112 Nausea with vomiting, unspecified: Secondary | ICD-10-CM

## 2021-10-16 LAB — CBC WITH DIFFERENTIAL/PLATELET
Abs Immature Granulocytes: 0.06 10*3/uL (ref 0.00–0.07)
Basophils Absolute: 0 10*3/uL (ref 0.0–0.1)
Basophils Relative: 0 %
Eosinophils Absolute: 0.5 10*3/uL (ref 0.0–0.5)
Eosinophils Relative: 4 %
HCT: 33.6 % — ABNORMAL LOW (ref 39.0–52.0)
Hemoglobin: 11.2 g/dL — ABNORMAL LOW (ref 13.0–17.0)
Immature Granulocytes: 1 %
Lymphocytes Relative: 9 %
Lymphs Abs: 1 10*3/uL (ref 0.7–4.0)
MCH: 30.1 pg (ref 26.0–34.0)
MCHC: 33.3 g/dL (ref 30.0–36.0)
MCV: 90.3 fL (ref 80.0–100.0)
Monocytes Absolute: 1.1 10*3/uL — ABNORMAL HIGH (ref 0.1–1.0)
Monocytes Relative: 10 %
Neutro Abs: 8.8 10*3/uL — ABNORMAL HIGH (ref 1.7–7.7)
Neutrophils Relative %: 76 %
Platelets: 309 10*3/uL (ref 150–400)
RBC: 3.72 MIL/uL — ABNORMAL LOW (ref 4.22–5.81)
RDW: 12.4 % (ref 11.5–15.5)
WBC: 11.5 10*3/uL — ABNORMAL HIGH (ref 4.0–10.5)
nRBC: 0 % (ref 0.0–0.2)

## 2021-10-16 LAB — URINALYSIS, ROUTINE W REFLEX MICROSCOPIC
Bilirubin Urine: NEGATIVE
Glucose, UA: NEGATIVE mg/dL
Hgb urine dipstick: NEGATIVE
Ketones, ur: NEGATIVE mg/dL
Leukocytes,Ua: NEGATIVE
Nitrite: NEGATIVE
Protein, ur: 30 mg/dL — AB
Specific Gravity, Urine: 1.008 (ref 1.005–1.030)
pH: 8 (ref 5.0–8.0)

## 2021-10-16 LAB — URINE CULTURE: Culture: NO GROWTH

## 2021-10-16 LAB — GLUCOSE, CAPILLARY
Glucose-Capillary: 210 mg/dL — ABNORMAL HIGH (ref 70–99)
Glucose-Capillary: 187 mg/dL — ABNORMAL HIGH (ref 70–99)
Glucose-Capillary: 215 mg/dL — ABNORMAL HIGH (ref 70–99)

## 2021-10-16 MED ORDER — ACETAMINOPHEN 325 MG PO TABS: 650.0000 mg | ORAL_TABLET | Freq: Four times a day (QID) | ORAL | Status: DC | PRN

## 2021-10-16 MED ORDER — HYDRALAZINE HCL 20 MG/ML IJ SOLN: 20.0000 mg | INTRAMUSCULAR | Status: DC | PRN

## 2021-10-16 MED ORDER — METOPROLOL TARTRATE 5 MG/5ML IV SOLN: 2.5000 mg | INTRAVENOUS | Status: DC | PRN

## 2021-10-16 MED ORDER — SODIUM CHLORIDE 0.9 % IV SOLN
1.0000 g | INTRAVENOUS | Status: DC
  Administered 2021-10-16: 1 g via INTRAVENOUS
  Filled 2021-10-16: qty 10

## 2021-10-16 MED ORDER — ENOXAPARIN SODIUM 40 MG/0.4ML IJ SOSY
40.0000 mg | PREFILLED_SYRINGE | INTRAMUSCULAR | Status: DC
Start: 2021-10-17 — End: 2023-02-01

## 2021-10-16 MED ORDER — SACCHAROMYCES BOULARDII 250 MG PO CAPS: 250.0000 mg | ORAL_CAPSULE | Freq: Two times a day (BID) | ORAL | Status: DC

## 2021-10-16 MED ORDER — SCOPOLAMINE 1 MG/3DAYS TD PT72: 1.0000 | MEDICATED_PATCH | TRANSDERMAL | 12 refills | Status: DC

## 2021-10-16 MED ORDER — OXYCODONE HCL 5 MG PO TABS: 5.0000 mg | ORAL_TABLET | Freq: Three times a day (TID) | ORAL | 0 refills | Status: DC

## 2021-10-16 MED ORDER — LABETALOL HCL 5 MG/ML IV SOLN: 20.0000 mg | INTRAVENOUS | Status: DC | PRN

## 2021-10-16 MED ORDER — JEVITY 1.5 CAL/FIBER PO LIQD: 1000.0000 mL | ORAL | Status: DC

## 2021-10-16 MED ORDER — LOPERAMIDE HCL 1 MG/7.5ML PO SUSP: 2.0000 mg | ORAL | 0 refills | Status: DC | PRN

## 2021-10-16 MED ORDER — AMLODIPINE BESYLATE 10 MG PO TABS: 10.0000 mg | ORAL_TABLET | Freq: Every day | ORAL | Status: DC

## 2021-10-16 MED ORDER — INSULIN GLARGINE-YFGN 100 UNIT/ML ~~LOC~~ SOLN: 8.0000 [IU] | Freq: Two times a day (BID) | SUBCUTANEOUS | 11 refills | Status: DC

## 2021-10-16 MED ORDER — BETHANECHOL CHLORIDE 25 MG PO TABS: 25.0000 mg | ORAL_TABLET | Freq: Three times a day (TID) | ORAL | Status: DC

## 2021-10-16 MED ORDER — SODIUM CHLORIDE 0.9% FLUSH: 10.0000 mL | INTRAVENOUS | Status: DC | PRN

## 2021-10-16 MED ORDER — CARVEDILOL 25 MG PO TABS: 25.0000 mg | ORAL_TABLET | Freq: Two times a day (BID) | ORAL | Status: DC

## 2021-10-16 MED ORDER — JUVEN PO PACK: 1.0000 | PACK | Freq: Two times a day (BID) | ORAL | 0 refills | Status: DC

## 2021-10-16 MED ORDER — COLLAGENASE 250 UNIT/GM EX OINT: TOPICAL_OINTMENT | Freq: Every day | CUTANEOUS | 0 refills | Status: DC

## 2021-10-16 MED ORDER — PANTOPRAZOLE SODIUM 40 MG PO PACK: 40.0000 mg | PACK | Freq: Every day | ORAL | Status: DC

## 2021-10-16 MED ORDER — DIATRIZOATE MEGLUMINE & SODIUM 66-10 % PO SOLN
30.0000 mL | Freq: Once | ORAL | Status: AC
  Administered 2021-10-16: 30 mL

## 2021-10-16 MED ORDER — SODIUM CHLORIDE 0.9 % IV SOLN
1000.0000 mL | INTRAVENOUS | 0 refills | Status: DC
Start: 2021-10-16 — End: 2023-02-01

## 2021-10-16 MED ORDER — FREE WATER: 200.0000 mL | Status: DC

## 2021-10-16 MED ORDER — QUETIAPINE FUMARATE 50 MG PO TABS: 50.0000 mg | ORAL_TABLET | Freq: Every day | ORAL | Status: DC

## 2021-10-16 MED ORDER — NUTRISOURCE FIBER PO PACK
1.0000 | PACK | Freq: Three times a day (TID) | ORAL | Status: DC
Start: 2021-10-16 — End: 2023-02-01

## 2021-10-16 MED ORDER — HYDRALAZINE HCL 100 MG PO TABS: 100.0000 mg | ORAL_TABLET | Freq: Three times a day (TID) | ORAL | Status: DC

## 2021-10-16 MED ORDER — FLECAINIDE ACETATE 150 MG PO TABS
150.0000 mg | ORAL_TABLET | Freq: Two times a day (BID) | ORAL | Status: DC
Start: 2021-10-16 — End: 2023-02-01

## 2021-10-16 MED ORDER — INSULIN ASPART 100 UNIT/ML IJ SOLN: 0.0000 [IU] | INTRAMUSCULAR | 11 refills | Status: DC

## 2021-10-16 MED ORDER — PROSOURCE TF PO LIQD: 90.0000 mL | Freq: Two times a day (BID) | ORAL | Status: DC

## 2021-10-16 MED ORDER — IPRATROPIUM-ALBUTEROL 0.5-2.5 (3) MG/3ML IN SOLN: 3.0000 mL | Freq: Four times a day (QID) | RESPIRATORY_TRACT | Status: DC | PRN

## 2021-10-16 MED ORDER — ORAL CARE MOUTH RINSE: 15.0000 mL | OROMUCOSAL | 0 refills | Status: DC

## 2021-10-16 MED ORDER — SODIUM CHLORIDE 0.9 % IV SOLN: 1.0000 g | INTRAVENOUS | Status: DC

## 2021-10-16 MED ORDER — GERHARDT'S BUTT CREAM: 1.0000 "application " | TOPICAL_CREAM | Freq: Two times a day (BID) | CUTANEOUS | Status: DC

## 2021-10-16 MED ORDER — CHLORHEXIDINE GLUCONATE 0.12% ORAL RINSE (MEDLINE KIT): 15.0000 mL | Freq: Two times a day (BID) | OROMUCOSAL | 0 refills | Status: DC

## 2021-10-16 NOTE — Plan of Care (Signed)
  Problem: Education: Goal: Knowledge of disease or condition will improve Outcome: Not Progressing   

## 2021-10-16 NOTE — Plan of Care (Signed)
Problem: Safety: Goal: Non-violent Restraint(s) 10/16/2021 1255 by Dorma Russell, RN Outcome: Adequate for Discharge 10/16/2021 1102 by Dorma Russell, RN Outcome: Progressing   Problem: Education: Goal: Knowledge of disease or condition will improve 10/16/2021 1255 by Dorma Russell, RN Outcome: Adequate for Discharge 10/16/2021 1102 by Dorma Russell, RN Outcome: Progressing Goal: Knowledge of secondary prevention will improve (SELECT ALL) 10/16/2021 1255 by Dorma Russell, RN Outcome: Adequate for Discharge 10/16/2021 1102 by Dorma Russell, RN Outcome: Progressing Goal: Knowledge of patient specific risk factors will improve (INDIVIDUALIZE FOR PATIENT) 10/16/2021 1255 by Dorma Russell, RN Outcome: Adequate for Discharge 10/16/2021 1102 by Dorma Russell, RN Outcome: Progressing Goal: Individualized Educational Video(s) 10/16/2021 1255 by Dorma Russell, RN Outcome: Adequate for Discharge 10/16/2021 1102 by Dorma Russell, RN Outcome: Progressing   Problem: Coping: Goal: Will verbalize positive feelings about self 10/16/2021 1255 by Dorma Russell, RN Outcome: Adequate for Discharge 10/16/2021 1102 by Dorma Russell, RN Outcome: Progressing   Problem: Education: Goal: Knowledge of disease or condition will improve 10/16/2021 1255 by Dorma Russell, RN Outcome: Adequate for Discharge 10/16/2021 1102 by Dorma Russell, RN Outcome: Progressing Goal: Knowledge of secondary prevention will improve (SELECT ALL) 10/16/2021 1255 by Dorma Russell, RN Outcome: Adequate for Discharge 10/16/2021 1102 by Dorma Russell, RN Outcome: Progressing Goal: Knowledge of patient specific risk factors will improve (INDIVIDUALIZE FOR PATIENT) 10/16/2021 1255 by Dorma Russell, RN Outcome: Adequate for Discharge 10/16/2021 1102 by Dorma Russell, RN Outcome: Progressing Goal: Individualized Educational Video(s) 10/16/2021 1255 by Dorma Russell, RN Outcome: Adequate for  Discharge 10/16/2021 1102 by Dorma Russell, RN Outcome: Progressing   Problem: Coping: Goal: Will verbalize positive feelings about self 10/16/2021 1255 by Dorma Russell, RN Outcome: Adequate for Discharge 10/16/2021 1102 by Dorma Russell, RN Outcome: Progressing Goal: Will identify appropriate support needs 10/16/2021 1255 by Dorma Russell, RN Outcome: Adequate for Discharge 10/16/2021 1102 by Dorma Russell, RN Outcome: Progressing   Problem: Health Behavior/Discharge Planning: Goal: Ability to manage health-related needs will improve 10/16/2021 1255 by Dorma Russell, RN Outcome: Adequate for Discharge 10/16/2021 1102 by Dorma Russell, RN Outcome: Progressing   Problem: Self-Care: Goal: Ability to participate in self-care as condition permits will improve 10/16/2021 1255 by Dorma Russell, RN Outcome: Adequate for Discharge 10/16/2021 1102 by Dorma Russell, RN Outcome: Progressing Goal: Verbalization of feelings and concerns over difficulty with self-care will improve 10/16/2021 1255 by Dorma Russell, RN Outcome: Adequate for Discharge 10/16/2021 1102 by Dorma Russell, RN Outcome: Progressing Goal: Ability to communicate needs accurately will improve 10/16/2021 1255 by Dorma Russell, RN Outcome: Adequate for Discharge 10/16/2021 1102 by Dorma Russell, RN Outcome: Progressing   Problem: Nutrition: Goal: Risk of aspiration will decrease 10/16/2021 1255 by Dorma Russell, RN Outcome: Adequate for Discharge 10/16/2021 1102 by Dorma Russell, RN Outcome: Progressing Goal: Dietary intake will improve 10/16/2021 1255 by Dorma Russell, RN Outcome: Adequate for Discharge 10/16/2021 1102 by Dorma Russell, RN Outcome: Progressing   Problem: Intracerebral Hemorrhage Tissue Perfusion: Goal: Complications of Intracerebral Hemorrhage will be minimized 10/16/2021 1255 by Dorma Russell, RN Outcome: Adequate for Discharge 10/16/2021 1102 by Dorma Russell,  RN Outcome: Progressing   Problem: Education: Goal: Knowledge about tracheostomy care/management will improve 10/16/2021 1255 by Dorma Russell, RN Outcome: Adequate for Discharge 10/16/2021 1102 by Dorma Russell, RN Outcome: Progressing  Problem: Activity: Goal: Ability to tolerate increased activity will improve 10/16/2021 1255 by Dorma Russell, RN Outcome: Adequate for Discharge 10/16/2021 1102 by Dorma Russell, RN Outcome: Progressing   Problem: Health Behavior/Discharge Planning: Goal: Ability to manage tracheostomy will improve 10/16/2021 1255 by Dorma Russell, RN Outcome: Adequate for Discharge 10/16/2021 1102 by Dorma Russell, RN Outcome: Progressing   Problem: Respiratory: Goal: Patent airway maintenance will improve 10/16/2021 1255 by Dorma Russell, RN Outcome: Adequate for Discharge 10/16/2021 1102 by Dorma Russell, RN Outcome: Progressing   Problem: Role Relationship: Goal: Ability to communicate will improve 10/16/2021 1255 by Dorma Russell, RN Outcome: Adequate for Discharge 10/16/2021 1102 by Dorma Russell, RN Outcome: Progressing   Problem: Education: Goal: Knowledge of General Education information will improve Description: Including pain rating scale, medication(s)/side effects and non-pharmacologic comfort measures 10/16/2021 1255 by Dorma Russell, RN Outcome: Adequate for Discharge 10/16/2021 1102 by Dorma Russell, RN Outcome: Progressing   Problem: Health Behavior/Discharge Planning: Goal: Ability to manage health-related needs will improve 10/16/2021 1255 by Dorma Russell, RN Outcome: Adequate for Discharge 10/16/2021 1102 by Dorma Russell, RN Outcome: Progressing   Problem: Clinical Measurements: Goal: Ability to maintain clinical measurements within normal limits will improve 10/16/2021 1255 by Dorma Russell, RN Outcome: Adequate for Discharge 10/16/2021 1102 by Dorma Russell, RN Outcome: Progressing Goal: Will remain  free from infection 10/16/2021 1255 by Dorma Russell, RN Outcome: Adequate for Discharge 10/16/2021 1102 by Dorma Russell, RN Outcome: Progressing Goal: Diagnostic test results will improve 10/16/2021 1255 by Dorma Russell, RN Outcome: Adequate for Discharge 10/16/2021 1102 by Dorma Russell, RN Outcome: Progressing Goal: Respiratory complications will improve 10/16/2021 1255 by Dorma Russell, RN Outcome: Adequate for Discharge 10/16/2021 1102 by Dorma Russell, RN Outcome: Progressing Goal: Cardiovascular complication will be avoided 10/16/2021 1255 by Dorma Russell, RN Outcome: Adequate for Discharge 10/16/2021 1102 by Dorma Russell, RN Outcome: Progressing   Problem: Activity: Goal: Risk for activity intolerance will decrease 10/16/2021 1255 by Dorma Russell, RN Outcome: Adequate for Discharge 10/16/2021 1102 by Dorma Russell, RN Outcome: Progressing   Problem: Nutrition: Goal: Adequate nutrition will be maintained 10/16/2021 1255 by Dorma Russell, RN Outcome: Adequate for Discharge 10/16/2021 1102 by Dorma Russell, RN Outcome: Progressing   Problem: Coping: Goal: Level of anxiety will decrease 10/16/2021 1255 by Dorma Russell, RN Outcome: Adequate for Discharge 10/16/2021 1102 by Dorma Russell, RN Outcome: Progressing   Problem: Elimination: Goal: Will not experience complications related to bowel motility 10/16/2021 1255 by Dorma Russell, RN Outcome: Adequate for Discharge 10/16/2021 1102 by Dorma Russell, RN Outcome: Progressing Goal: Will not experience complications related to urinary retention 10/16/2021 1255 by Dorma Russell, RN Outcome: Adequate for Discharge 10/16/2021 1102 by Dorma Russell, RN Outcome: Progressing   Problem: Pain Managment: Goal: General experience of comfort will improve 10/16/2021 1255 by Dorma Russell, RN Outcome: Adequate for Discharge 10/16/2021 1102 by Dorma Russell, RN Outcome: Progressing

## 2021-10-16 NOTE — Progress Notes (Signed)
Discharge instructions (including medications) discussed with and copy provided to patient/caregiver 

## 2021-10-16 NOTE — Progress Notes (Signed)
Physical Therapy Treatment Patient Details Name: Caleb Vaughan MRN: [id number] DOB: July 14, 1961 Today's Date: 10/16/2021   History of Present Illness Patient is a 61 y/o male admitted due to R thalamic hemorrhage, edema and trace leftward midline shift, intubated on admission, attempted extubation 1/29, re-intubated that evening.  Trach 1/31. Pt is s/p peg tube on 2/3. PMH positive for a-fib on coumadin, HTN, HLD, previous smoker, prior CVA, DM, OSA.    PT Comments    The pt was agreeable to session with PT/OT this afternoon with focus on progressing seated balance and activity tolerance. The pt was able to tolerate ~10 min sitting EOB with intermittent lateral leaning to R elbow and RUE reaching activities, but continues to present with poor seated balance needing min-modA to correct posterior and L lateral lean as well as to provide assistance and facilitation at pelvis and trunk for reaching outside BOS and correcting to neutral posture. The pt did c/o back pain, and therefore supine trunk rotations were used to facilitate increased ROM and manage pain. Pt tolerated well but session limited by back pain (RN gave meds during session) and transfer to [facility] at end of session.     Recommendations for follow up therapy are one component of a multi-disciplinary discharge planning process, led by the attending physician.  Recommendations may be updated based on patient status, additional functional criteria and insurance authorization.  Follow Up Recommendations  PT at [facility] acute [facility] hospital     Assistance Recommended at Discharge Frequent or constant Supervision/Assistance  Patient can return home with the following Two people to help with walking and/or transfers;Two people to help with bathing/dressing/bathroom;Assistance with cooking/housework;Assistance with feeding;Direct supervision/assist for medications management;Direct supervision/assist for financial management;Assist for  transportation;Help with stairs or ramp for entrance   Equipment Recommendations  Wheelchair (measurements PT);Wheelchair cushion (measurements PT);Hospital bed    Recommendations for Other Services       Precautions / Restrictions Precautions Precautions: Fall Precaution Comments: trach 5L/28%, FiO2,  R wrist restraint, peg tube recently inappropriately removed by patient. Restrictions Weight Bearing Restrictions: No     Mobility  Bed Mobility Overal bed mobility: Needs Assistance Bed Mobility: Sidelying to Sit, Rolling, Sit to Supine Rolling: Max assist, +2 for physical assistance Sidelying to sit: Max assist, +2 for physical assistance   Sit to supine: Max assist, +2 for physical assistance   General bed mobility comments: required assistance with rolling with patient using RUE with rails when cued.    Transfers Overall transfer level: Needs assistance                 General transfer comment: deferred due to pt inability to maintain static sitting EOB at this time, focus on seated balance tasks. would need lift currently       Modified Rankin (Stroke Patients Only) Modified Rankin (Stroke Patients Only) Pre-Morbid Rankin Score: No symptoms Modified Rankin: Severe disability     Balance Overall balance assessment: Needs assistance Sitting-balance support: Single extremity supported, Feet supported Sitting balance-Leahy Scale: Poor Sitting balance - Comments: RUE pushing during sitting balance.  Patient indicated he was pushing due to back pain.  Patient performed reaching tasks to increase posture and balance Postural control: Posterior lean, Left lateral lean                                  Cognition Arousal/Alertness: Awake/alert Behavior During Therapy: Restless Overall Cognitive Status: Impaired/Different from baseline  Area of Impairment: Attention, Following commands, Safety/judgement                   Current Attention  Level: Sustained   Following Commands: Follows one step commands consistently, Follows multi-step commands with increased time Safety/Judgement: Decreased awareness of safety, Decreased awareness of deficits Awareness: Intellectual Problem Solving: Requires tactile cues, Requires verbal cues, Difficulty sequencing General Comments: eyes awake and followed directions. Appeared to have been aware of BM in bed        Exercises Other Exercises Other Exercises: seated reaching with RUE, mod-maxA with tactile facilitation and cues through trunk and pelvis for improved reach and to regain neutral position x 15 Other Exercises: supine trunk rotation with bilateral knees bent    General Comments        Pertinent Vitals/Pain Pain Assessment Pain Assessment: Faces Faces Pain Scale: Hurts little more Pain Location: lower back Pain Descriptors / Indicators: Discomfort, Grimacing Pain Intervention(s): Limited activity within patient's tolerance, Monitored during session, Repositioned     PT Goals (current goals can now be found in the care plan section) Acute Rehab PT Goals Patient Stated Goal: rehab post-acute PT Goal Formulation: With patient Time For Goal Achievement: 10/28/21 Potential to Achieve Goals: Fair Progress towards PT goals: Progressing toward goals    Frequency    Min 3X/week      PT Plan Current plan remains appropriate    Co-evaluation PT/OT/SLP Co-Evaluation/Treatment: Yes Reason for Co-Treatment: Complexity of the patient's impairments (multi-system involvement);Necessary to address cognition/behavior during functional activity;For patient/therapist safety PT goals addressed during session: Mobility/safety with mobility;Balance OT goals addressed during session: ADL's and self-care      AM-PAC PT "6 Clicks" Mobility   Outcome Measure  Help needed turning from your back to your side while in a flat bed without using bedrails?: A Lot Help needed moving  from lying on your back to sitting on the side of a flat bed without using bedrails?: A Lot Help needed moving to and from a bed to a chair (including a wheelchair)?: Total Help needed standing up from a chair using your arms (e.g., wheelchair or bedside chair)?: Total Help needed to walk in hospital room?: Total Help needed climbing 3-5 steps with a railing? : Total 6 Click Score: 8    End of Session Equipment Utilized During Treatment: Oxygen Activity Tolerance: Patient tolerated treatment well Patient left: with call bell/phone within reach;in bed;with bed alarm set;with nursing/sitter in room;with restraints reapplied Nurse Communication: Mobility status PT Visit Diagnosis: Hemiplegia and hemiparesis;Other abnormalities of gait and mobility (R26.89);Muscle weakness (generalized) (M62.81) Hemiplegia - Right/Left: Left Hemiplegia - dominant/non-dominant: Non-dominant Hemiplegia - caused by: Nontraumatic intracerebral hemorrhage     Time: 1350-1416 PT Time Calculation (min) (ACUTE ONLY): 26 min  Charges:  $Neuromuscular Re-education: 8-22 mins                     West Carbo, PT, DPT   Acute Rehabilitation Department Pager #: [phone]   Sandra Cockayne 10/16/2021, 2:32 PM

## 2021-10-16 NOTE — Progress Notes (Signed)
Occupational Therapy Treatment Patient Details Name: Caleb Vaughan MRN: [id number] DOB: July 28, 1961 Today's Date: 10/16/2021   History of present illness Patient is a 61 y/o male admitted due to R thalamic hemorrhage, edema and trace leftward midline shift, intubated on admission, attempted extubation 1/29, re-intubated that evening.  Trach 1/31. Pt is s/p peg tube on 2/3. PMH positive for a-fib on coumadin, HTN, HLD, previous smoker, prior CVA, DM, OSA.   OT comments  Patient seen with PT co-treat to address sitting balance and bed mobility. Patient was max assist +2 to get to EOB and was discovered to have had a BM and was assisted back to supine and patient assisted with using rail to roll for cleaning. Patient returned to EOB with RUE pushing and reaching tasks performed to prevent pushing and to address posture and balance. Patient returned to supine and assisted with cleaning due to another BM. Patient is expected to be discharged to [facility] later today.    Recommendations for follow up therapy are one component of a multi-disciplinary discharge planning process, led by the attending physician.  Recommendations may be updated based on patient status, additional functional criteria and insurance authorization.    Follow Up Recommendations  OT at Long-term acute care hospital    Assistance Recommended at Discharge Frequent or constant Supervision/Assistance  Patient can return home with the following  Two people to help with walking and/or transfers;Two people to help with bathing/dressing/bathroom;Assistance with cooking/housework;Assistance with feeding;Direct supervision/assist for medications management;Direct supervision/assist for financial management;Assist for transportation;Help with stairs or ramp for entrance   Equipment Recommendations  BSC/3in1;Wheelchair (measurements OT);Wheelchair cushion (measurements OT)    Recommendations for Other Services      Precautions /  Restrictions Precautions Precautions: Fall Precaution Comments: trach 10L/60%, FiO2,  R wrist restraint, peg tube recently inappropriately removed by patient. Restrictions Weight Bearing Restrictions: No       Mobility Bed Mobility Overal bed mobility: Needs Assistance Bed Mobility: Sidelying to Sit, Rolling, Sit to Supine Rolling: Max assist, +2 for physical assistance Sidelying to sit: Max assist, +2 for physical assistance   Sit to supine: Max assist, +2 for physical assistance   General bed mobility comments: required assistance with rolling with patient using RUE with rails    Transfers Overall transfer level: Needs assistance                 General transfer comment: address EOB sitting balance     Balance Overall balance assessment: Needs assistance Sitting-balance support: Single extremity supported, Feet supported Sitting balance-Leahy Scale: Poor Sitting balance - Comments: RUE pushing during sitting balance.  Patient indicated he was pushing due to back pain.  Patient performed reaching tasks to increase posture and balance Postural control: Posterior lean, Left lateral lean                                 ADL either performed or assessed with clinical judgement   ADL Overall ADL's : Needs assistance/impaired             Lower Body Bathing: Maximal assistance;Bed level Lower Body Bathing Details (indicate cue type and reason): assistance needed with rolling side to side to assist with cleaning following BM                       General ADL Comments: LB bathing performed at bed level due to [facility]    Extremity/Trunk Assessment  Upper Extremity Assessment LUE Deficits / Details: PROM WFL; no AROM observed; edematous L hand; elevated on 2 pillows LUE Sensation: decreased proprioception;decreased light touch LUE Coordination: decreased fine motor;decreased gross motor            Vision       Perception     Praxis       Cognition Arousal/Alertness: Awake/alert Behavior During Therapy: Restless Overall Cognitive Status: Impaired/Different from baseline Area of Impairment: Attention, Following commands, Safety/judgement                   Current Attention Level: Sustained   Following Commands: Follows one step commands consistently, Follows multi-step commands with increased time Safety/Judgement: Decreased awareness of safety, Decreased awareness of deficits Awareness: Intellectual Problem Solving: Requires tactile cues, Requires verbal cues, Difficulty sequencing General Comments: eyes awake and followed directions. Appeared to have been aware of BM in bed        Exercises      Shoulder Instructions       General Comments      Pertinent Vitals/ Pain       Pain Assessment Pain Assessment: Faces Faces Pain Scale: Hurts little more Pain Location: lower back Pain Descriptors / Indicators: Discomfort, Grimacing Pain Intervention(s): Limited activity within patient's tolerance, Monitored during session, Repositioned, RN gave pain meds during session  Home Living                                          Prior Functioning/Environment              Frequency  Min 2X/week        Progress Toward Goals  OT Goals(current goals can now be found in the care plan section)  Progress towards OT goals: Progressing toward goals  Acute Rehab OT Goals OT Goal Formulation: Patient unable to participate in goal setting Time For Goal Achievement: 10/28/21 Potential to Achieve Goals: Good ADL Goals Pt Will Perform Grooming: with set-up;sitting;with supervision Pt Will Perform Upper Body Bathing: with min assist;sitting Pt Will Perform Lower Body Bathing: with mod assist;sit to/from stand Pt Will Transfer to Toilet: with mod assist;with +2 assist;bedside commode Additional ADL Goal #1: MAintain midline postural control with S to increase independence with ADL and  mobility  Plan Discharge plan remains appropriate    Co-evaluation    PT/OT/SLP Co-Evaluation/Treatment: Yes Reason for Co-Treatment: For patient/therapist safety   OT goals addressed during session: ADL's and self-care      AM-PAC OT "6 Clicks" Daily Activity     Outcome Measure   Help from another person eating meals?: Total Help from another person taking care of personal grooming?: A Lot Help from another person toileting, which includes using toliet, bedpan, or urinal?: Total Help from another person bathing (including washing, rinsing, drying)?: A Lot Help from another person to put on and taking off regular upper body clothing?: A Lot Help from another person to put on and taking off regular lower body clothing?: Total 6 Click Score: 9    End of Session Equipment Utilized During Treatment: Oxygen  OT Visit Diagnosis: Unsteadiness on feet (R26.81);Other abnormalities of gait and mobility (R26.89);Muscle weakness (generalized) (M62.81);Other symptoms and signs involving the nervous system (R29.898);Other symptoms and signs involving cognitive function;Hemiplegia and hemiparesis Hemiplegia - Right/Left: Left Hemiplegia - dominant/non-dominant: Non-Dominant Hemiplegia - caused by: Nontraumatic intracerebral hemorrhage   Activity Tolerance Patient  tolerated treatment well   Patient Left in bed;with call bell/phone within reach;with bed alarm set;with nursing/sitter in room   Nurse Communication Mobility status        Time: 1349-1416 OT Time Calculation (min): 27 min  Charges: OT General Charges $OT Visit: 1 Visit OT Treatments $Self Care/Home Management : 8-22 mins  Alfonse Flavors, OTA Acute Rehabilitation Services  Pager [phone] Office [phone]   Dewain Penning 10/16/2021, 2:25 PM

## 2021-10-16 NOTE — Assessment & Plan Note (Addendum)
Foley catheter has been in place for 22 days and was discontinued on day 23.  Unfortunately patient redeveloped acute urinary retention and Foley catheter replaced on 2/15

## 2021-10-16 NOTE — Progress Notes (Signed)
CSW spoke with Caleb Vaughan at [facility] who states the patient's insurance authorization was approved. Patient can be transferred to [facility] today.  CSW notified Revonda Standard, NP of need for discharge paperwork.  CSW attempted to reach patient's son without success - a voicemail was left requesting a return call.  CSW spoke with patient's mother who was agreeable to sign admissions paperwork. CSW informed Caleb Vaughan at [facility] of information.  Edwin Dada, MSW, LCSW Transitions of Care   Clinical Social Worker II [phone]

## 2021-10-16 NOTE — Assessment & Plan Note (Signed)
Recurrent fevers and leukocytosis initially felt to be chronic in nature Pulmonary work-up did reveal evidence of mild pneumonia so was treated for 5 days with antibiotics but leukocytosis and fevers have persisted Blood cultures have been negative In context of requirement of Foley with recent Foley in place for 22 days urinalysis and culture obtained.  Urinalysis appears consistent with UTI and so has been started on empiric IV Rocephin.  Given the fact that the Foley catheter remains in place I have ordered this antibiotic for 7 days.  Please follow-up on urine culture in the event patient has a resistant organism and antibiotics need to be changed.

## 2021-10-16 NOTE — Plan of Care (Signed)
°  Problem: Safety: Goal: Non-violent Restraint(s) Outcome: Progressing   Problem: Education: Goal: Knowledge of disease or condition will improve Outcome: Progressing Goal: Knowledge of secondary prevention will improve ([facility]) Outcome: Progressing Goal: Knowledge of patient specific risk factors will improve (INDIVIDUALIZE FOR PATIENT) Outcome: Progressing Goal: Individualized Educational Video(s) Outcome: Progressing   Problem: Coping: Goal: Will verbalize positive feelings about self Outcome: Progressing   Problem: Education: Goal: Knowledge of disease or condition will improve Outcome: Progressing Goal: Knowledge of secondary prevention will improve ([facility]) Outcome: Progressing Goal: Knowledge of patient specific risk factors will improve (INDIVIDUALIZE FOR PATIENT) Outcome: Progressing Goal: Individualized Educational Video(s) Outcome: Progressing   Problem: Coping: Goal: Will verbalize positive feelings about self Outcome: Progressing Goal: Will identify appropriate support needs Outcome: Progressing   Problem: Health Behavior/Discharge Planning: Goal: Ability to manage health-related needs will improve Outcome: Progressing   Problem: Self-Care: Goal: Ability to participate in self-care as condition permits will improve Outcome: Progressing Goal: Verbalization of feelings and concerns over difficulty with self-care will improve Outcome: Progressing Goal: Ability to communicate needs accurately will improve Outcome: Progressing   Problem: Nutrition: Goal: Risk of aspiration will decrease Outcome: Progressing Goal: Dietary intake will improve Outcome: Progressing   Problem: Intracerebral Hemorrhage Tissue Perfusion: Goal: Complications of Intracerebral Hemorrhage will be minimized Outcome: Progressing   Problem: Education: Goal: Knowledge about tracheostomy care/management will improve Outcome: Progressing   Problem: Activity: Goal: Ability  to tolerate increased activity will improve Outcome: Progressing   Problem: Health Behavior/Discharge Planning: Goal: Ability to manage tracheostomy will improve Outcome: Progressing   Problem: Respiratory: Goal: Patent airway maintenance will improve Outcome: Progressing   Problem: Role Relationship: Goal: Ability to communicate will improve Outcome: Progressing   Problem: Education: Goal: Knowledge of General Education information will improve Description: Including pain rating scale, medication(s)/side effects and non-pharmacologic comfort measures Outcome: Progressing   Problem: Health Behavior/Discharge Planning: Goal: Ability to manage health-related needs will improve Outcome: Progressing   Problem: Clinical Measurements: Goal: Ability to maintain clinical measurements within normal limits will improve Outcome: Progressing Goal: Will remain free from infection Outcome: Progressing Goal: Diagnostic test results will improve Outcome: Progressing Goal: Respiratory complications will improve Outcome: Progressing Goal: Cardiovascular complication will be avoided Outcome: Progressing   Problem: Activity: Goal: Risk for activity intolerance will decrease Outcome: Progressing   Problem: Coping: Goal: Level of anxiety will decrease Outcome: Progressing   Problem: Nutrition: Goal: Adequate nutrition will be maintained Outcome: Progressing   Problem: Elimination: Goal: Will not experience complications related to bowel motility Outcome: Progressing Goal: Will not experience complications related to urinary retention Outcome: Progressing   Problem: Pain Managment: Goal: General experience of comfort will improve Outcome: Progressing

## 2021-10-16 NOTE — Discharge Summary (Addendum)
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   Acute respiratory failure with hypoxia (HCC)   Tracheostomy dependence (HCC)   Dysphagia following nontraumatic intracerebral hemorrhage   Hypertension associated with diabetes ([place])   AKI (acute kidney injury) ([place])   Type 2 diabetes mellitus ([place])   Obstructive sleep apnea/obesity   Physical deconditioning   Leukocytosis   Paroxysmal A-fib ([place])   Deep tissue injury sacrum   Diarrhea   Acute urinary retention   Urinary tract infection  Resolved Problems:   Aspiration pneumonitis (HCC)   Atrial fibrillation, permanent (HCC)   Tracheal stenosis   Acute hypernatremia   Hospital Course: 61 year old male with history of A-fib on Coumadin, hyperlipidemia, hypertension, diabetes mellitus type 2, OSA, prior CVA who presented to the emergency department on 1/12 as code stroke.  He presented with left-sided weakness, dysarthria, right fixed gaze.  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  Neurology consulted.  Patient was intubated in the ED. patient was unable to wean off of mechanical ventilation so he was trached on 09/29/2021, PEG placement done on 10/02/2021.  Transferred to [facility] service on 10/05/2021.  Hospital course remarkable for persistent encephalopathy.  PT/OT recommending CIR but looks like he will not be able to participate with therapy anytime soon so the  current plan is for [facility].  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     Patient was on Coumadin prior to admission and presented with hypertensive urgency MRI of the brain showed a stable right thalamic hemorrhage, regional edema, stable to moderate intraventricular hemorrhage with no ventriculomegaly. MRA showed chronic lacunar infarcts compatible with advanced small vessel disease.   Carotid Doppler showed 1-39% stenosis on left and right ICA. Echocardiogram showed EF of 62%.   LDL of 52. Hemoglobin A1c of 9.7. Intermittently agitated.    Continue Seroquel 50 mg at bedtime Cerebral edema stable as per MRI and follow-up CT scan was treated with hypertonic saline Communication improving he is able to write some with his right hand and communicate by nodding yes or no and does not appear to have any receptive aphasia or any expressive aphasia but difficult to accurately assess given trach.     Acute respiratory failure with hypoxia ([place]) Had to be intubated on presentation.  Extubated on 1/29 and had to be reintubated.   Now trach dependent.  O2 needs have decreased from 60% to 28% %.  We will continue to follow.  Secretions have also diminished      Tracheostomy dependence ([place]) Continued thick secretions limits PMSV tolerance Trach team following  SLP following.  Unable to tolerate PMSV as of 2/16 noted continues to cough out  Dysphagia following nontraumatic intracerebral hemorrhage Recent swallowing trial with PMV in place with documented coughing on 1 of 2 thin liquid trials.-Tolerated non thin liquids better without valve  in place Patient unable to wear valve long enough for length of meal Reevaluated on 2/14 and noted with significant oropharyngeal dysphagia Continue current tube feedings  with protein supplementation  Hypertension associated with diabetes ([place]) On amlodipine, Coreg, hydralazine  Target blood pressures less than 160 mmHg Does have early morning hypertension with SBP's in the 170s prior to medication administration  AKI (acute kidney injury) ([place]) Baseline creatinine 0.8-0.9 Current creatinine 1.38 with mildly elevated BUN Continue free water Not on any offending medication 2/14 begin normal saline at 100 cc/h and follow labs  Type 2 diabetes mellitus ([place]) Uncontrolled.  Hemoglobin A1c of 9.7.   On Jardiance, metformin at home.   Currently on sliding scale insulin. CBGs poorly controlled with readings between 140s and 240s CBGs remain greater than 200 -increase Semglee to 8 units BID  Obstructive sleep apnea/obesity- (present on admission) Unknown if on CPAP at home Currently has trach BMI 35.2  Physical deconditioning Secondary to stroke with left hemiparesis noted as well as lack of sensation on the left Continue PT and OT Plan for short-term rehabilitation and continued therapy at [facility] 2/15 begin out of bed to chair with Hoyer lift  Leukocytosis Currently afebrile. One of the blood cultures showed Staph epidermidis: Most likely contamination.  Repeat blood cultures have been negative.   Fever was most likely secondary to intracerebral hemorrhage. He completed the course of Rocephin. Tracheal aspirate is showing gram-negative rods, gram-positive cocci in pairs, no staph or Pseudomonas.  Since leukocytosis was worsening,  restarted ceftriaxone.chest x-ray showed mild patchy airspace opacities at the bases potentially atelectasis or infection.  Completed 5-day course of Rocephin on 2/5 See additional documentation regarding acute UTI  Paroxysmal A-fib ([place])- (present on admission) Currently not on anticoagulation due to ICH-on warfarin prior to admission Continue flecainide Currently rate controlled with ventricular rates in the 70-90  range  Deep tissue injury sacrum Has been evaluated by wound care nurse Has DTI and upper sacrum that measures 0.5 x 0.5 cm Below this area has 2 unstageable pressure areas each 1 x 1 cm with 100% yellow base and moist. Plan is to apply Santyl to all of these wounds daily Please review [place] RN note for detail  Urinary tract infection Recurrent fevers and leukocytosis initially felt to be chronic in nature Pulmonary work-up did reveal evidence of mild pneumonia so was treated for 5 days with antibiotics but leukocytosis and fevers have persisted Blood cultures have been negative In context of requirement of Foley with recent Foley in place for 22 days urinalysis and culture obtained.  Urinalysis appears consistent with UTI and so has been started on empiric IV Rocephin.  Given the fact that the Foley catheter remains in place I have ordered this antibiotic for 7 days.  Please follow-up on urine culture in the event patient has a resistant organism and antibiotics need to be changed.  Acute urinary retention Foley catheter has been in place for 22 days and was discontinued on day 23.  Unfortunately patient redeveloped acute urinary retention and Foley catheter replaced on 2/15  Diarrhea All prn laxatives and stool softeners were discontinued.   Clinical signs consistent with C. difficile Does have as needed Imodium available Continue Florastor and Nutri source fiber pack   Hypertensive emergency Presented with significantly elevated blood pressure.  BP now controlled         Pain control - [employer] Controlled Substance Reporting System database was reviewed. and patient was instructed, not to drive, operate heavy  machinery, perform activities at heights, swimming or participation in water activities or provide baby-sitting services while on Pain, Sleep and Anxiety Medications; until their outpatient Physician has advised to do so again. Also recommended to not to take more than  prescribed Pain, Sleep and Anxiety Medications.   Consultants: PCCM, general surgery Procedures performed: Echocardiogram, PEG tube per GI service Antibiotics: Azithromycin x1 dose 1/22 Ceftriaxone x1 dose 1/22 Ceftriaxone 2/1 through 2/5 Disposition:  LTAC Diet recommendation:  NPO except for medications, tube feedings and nutritional supplements per feeding tube  DISCHARGE MEDICATION: Allergies as of 10/16/2021       Reactions   Apixaban Anaphylaxis   Other reaction(s): Myalgias (intolerance)   Iodinated Contrast Media Hives   Shellfish Allergy Itching, Hives   Throat feels a little scratchy        Medication List     STOP taking these medications    atorvastatin 80 MG tablet Commonly known as: LIPITOR   Jardiance 25 MG Tabs tablet Generic drug: empagliflozin   Levemir FlexTouch 100 UNIT/ML FlexPen Generic drug: insulin detemir   losartan-hydrochlorothiazide 100-25 MG tablet Commonly known as: HYZAAR   metFORMIN 1000 MG tablet Commonly known as: GLUCOPHAGE   warfarin 5 MG tablet Commonly known as: COUMADIN       TAKE these medications    acetaminophen 325 MG tablet Commonly known as: TYLENOL Place 2 tablets (650 mg total) into feeding tube every 6 (six) hours as needed for mild pain or fever.   amLODipine 10 MG tablet Commonly known as: NORVASC Place 1 tablet (10 mg total) into feeding tube daily. Start taking on: October 17, 2021 What changed: how to take this   bethanechol 25 MG tablet Commonly known as: URECHOLINE Place 1 tablet (25 mg total) into feeding tube 3 (three) times daily.   carvedilol 25 MG tablet Commonly known as: COREG Place 1 tablet (25 mg total) into feeding tube 2 (two) times daily with a meal. What changed:  how to take this when to take this   cefTRIAXone 1 g in sodium chloride 0.9 % 100 mL Inject 1 g into the vein daily.   chlorhexidine gluconate (MEDLINE KIT) 0.12 % solution Commonly known as: PERIDEX 15 mLs by  Mouth Rinse route 2 (two) times daily.   collagenase ointment Commonly known as: SANTYL Apply topically daily. Start taking on: October 17, 2021   enoxaparin 40 MG/0.4ML injection Commonly known as: LOVENOX Inject 0.4 mLs (40 mg total) into the skin daily. Start taking on: October 17, 2021   feeding supplement (PROSource TF) liquid Place 90 mLs into feeding tube 2 (two) times daily.   feeding supplement (JEVITY 1.5 CAL/FIBER) Liqd Place 1,000 mLs into feeding tube continuous.   nutrition supplement (JUVEN) Pack Place 1 packet into feeding tube 2 (two) times daily between meals.   fiber Pack packet Place 1 packet into feeding tube 3 (three) times daily.   flecainide 150 MG tablet Commonly known as: TAMBOCOR Place 1 tablet (150 mg total) into feeding tube 2 (two) times daily. What changed: how to take this   free water Soln Place 200 mLs into feeding tube every 3 (three) hours.   Gerhardt's butt cream Crea Apply 1 application topically 2 (two) times daily.   hydrALAZINE 20 MG/ML injection Commonly known as: APRESOLINE Inject 1 mL (20 mg total) into the vein every 4 (four) hours as needed (BP less than 160).   hydrALAZINE 100 MG tablet Commonly known as: APRESOLINE Place 1 tablet (100 mg total)  into feeding tube every 8 (eight) hours.   insulin aspart 100 UNIT/ML injection Commonly known as: novoLOG Inject 0-20 Units into the skin every 4 (four) hours. Do NOT hold insulin if patient is NPO. Resistant Scale.   insulin glargine-yfgn 100 UNIT/ML injection Commonly known as: SEMGLEE Inject 0.08 mLs (8 Units total) into the skin 2 (two) times daily.   ipratropium-albuterol 0.5-2.5 (3) MG/3ML Soln Commonly known as: DUONEB Take 3 mLs by nebulization every 6 (six) hours as needed.   labetalol 5 MG/ML injection Commonly known as: NORMODYNE Inject 4 mLs (20 mg total) into the vein every 10 (ten) minutes as needed (For SBP > 160, hold for HR < 60, maximum daily dose =  310m).   loperamide HCl 1 MG/7.5ML suspension Commonly known as: IMODIUM Place 15 mLs (2 mg total) into feeding tube as needed for diarrhea or loose stools.   metoprolol tartrate 5 MG/5ML Soln injection Commonly known as: LOPRESSOR Inject 2.5-5 mLs (2.5-5 mg total) into the vein every 3 (three) hours as needed (SBP > 180).   mouth rinse Liqd solution 15 mLs by Mouth Rinse route every 4 (four) hours.   oxyCODONE 5 MG immediate release tablet Commonly known as: Oxy IR/ROXICODONE Place 1 tablet (5 mg total) into feeding tube 3 (three) times daily.   pantoprazole sodium 40 mg Commonly known as: PROTONIX Place 40 mg into feeding tube at bedtime.   QUEtiapine 50 MG tablet Commonly known as: SEROQUEL Place 1 tablet (50 mg total) into feeding tube at bedtime.   saccharomyces boulardii 250 MG capsule Commonly known as: FLORASTOR Place 1 capsule (250 mg total) into feeding tube 2 (two) times daily.   scopolamine 1 MG/3DAYS Commonly known as: TRANSDERM-SCOP Place 1 patch (1.5 mg total) onto the skin every 3 (three) days.   sodium chloride 0.9 % infusion Inject 1,000 mLs into the vein continuous.   sodium chloride flush 0.9 % Soln Commonly known as: NS 10-40 mLs by Intracatheter route as needed (flush).               Discharge Care Instructions  (From admission, onward)           Start     Ordered   10/16/21 0000  Discharge wound care:       Comments: Apply Santyl to all 3 sacral wounds daily.  Cover with moist gauze and then cover this with a medium foam dressing.  May also change as needed soilage   10/16/21 1200             Discharge Exam: Filed Weights   10/09/21 0105 10/13/21 0500 10/16/21 0500  Weight: 92.9 kg 91.8 kg 91.9 kg   General: Awake, no acute distress Pulmonary: 6.0 cuffless Shiley trach, FiO2 has been decreased to 28% anterior lung sounds coarse -decrease in secretions Cardiac: S1-S2, normotensive, no peripheral edema except for some  focal edema in LUE in context of hemiparesis Abdomen: Soft nontender nondistended with normoactive bowel sounds.  PEG tube in place with tube feeding infusing. LBM 2/16 Genitourinary: Foley reinserted on 2/15 Musculoskeletal: Flaccid left side.  No hypertonicity Neurological: Nerves II through XII appear to be grossly intact but exam limited by current neurological status and requirement for tracheostomy tube.  Flaccid on left side and patient able to relate that he is insensate on the left.  Moves right upper extremity well requiring wrist restraint strength appears to be about 4/5 Psychiatric: Awake and does follow simple commands  Condition at discharge: stable  The results of significant diagnostics from this hospitalization (including imaging, microbiology, ancillary and laboratory) are listed below for reference.   Imaging Studies: CT HEAD WO CONTRAST (5MM)  Result Date: 09/25/2021 CLINICAL DATA:  Stroke follow-up EXAM: CT HEAD WITHOUT CONTRAST TECHNIQUE: Contiguous axial images were obtained from the base of the skull through the vertex without intravenous contrast. RADIATION DOSE REDUCTION: This exam was performed according to the departmental dose-optimization program which includes automated exposure control, adjustment of the mA and/or kV according to patient size and/or use of iterative reconstruction technique. COMPARISON:  Three days ago FINDINGS: Brain: Parenchymal hemorrhage centered at the right thalamus and internal capsule with intraventricular extension into the right more than left lateral ventricles. The hematoma measures up to 3.3 cm in diameter, non progressed. No interval infarct or hydrocephalus. Small remote infarcts in the left thalamus Vascular: No hyperdense vessel or unexpected calcification. Skull: Normal. Negative for fracture or focal lesion. Sinuses/Orbits: No acute finding. IMPRESSION: No progression of the right thalamocapsular hematoma with intraventricular  extension. No hydrocephalus or worrisome mass effect. Electronically Signed   By: Jorje Guild M.D.   On: 09/25/2021 04:01   CT HEAD WO CONTRAST (5MM)  Result Date: 09/22/2021 CLINICAL DATA:  Follow-up intracranial hemorrhage. EXAM: CT HEAD WITHOUT CONTRAST TECHNIQUE: Contiguous axial images were obtained from the base of the skull through the vertex without intravenous contrast. RADIATION DOSE REDUCTION: This exam was performed according to the departmental dose-optimization program which includes automated exposure control, adjustment of the mA and/or kV according to patient size and/or use of iterative reconstruction technique. COMPARISON:  Earlier CT dated 09/20/2021. FINDINGS: Brain: No significant interval change in the right thalamic hemorrhage with extension into the ventricular system since the prior CT. No significant midline shift. The ventricles and sulci appropriate size for patient's age. Mild periventricular and deep white matter chronic microvascular ischemic changes noted. Vascular: No acute findings. Skull: Normal. Negative for fracture or focal lesion. Sinuses/Orbits: Opacification of the nasopharynx. The visualized paranasal sinuses and mastoid air cells are clear. Other: An endotracheal and an enteric tube are partially visualized. IMPRESSION: No significant interval change in the right thalamic hemorrhage with extension into the ventricular system since the prior CT. No change in the ventricular size. Continued close follow-up recommended. Electronically Signed   By: Anner Crete M.D.   On: 09/22/2021 02:15   CT HEAD WO CONTRAST  Result Date: 09/20/2021 CLINICAL DATA:  Intracranial hemorrhage follow-up EXAM: CT HEAD WITHOUT CONTRAST TECHNIQUE: Contiguous axial images were obtained from the base of the skull through the vertex without intravenous contrast. RADIATION DOSE REDUCTION: This exam was performed according to the departmental dose-optimization program which includes  automated exposure control, adjustment of the mA and/or kV according to patient size and/or use of iterative reconstruction technique. COMPARISON:  CT head earlier today 09/20/2021 FINDINGS: Brain: High-density hemorrhage centered in the right thalamus unchanged. The hematoma measures approximately 3.2 x 3.2 x 3.5 cm. There is intraventricular extension primarily in the right lateral ventricle also unchanged. No hydrocephalus. Mild midline shift to the left unchanged. No acute infarct or mass lesion identified.  Mild atrophy. Vascular: Negative for hyperdense vessel. Atherosclerotic calcification in the carotid and vertebral arteries bilaterally. Skull: Negative Sinuses/Orbits: Negative Other: None IMPRESSION: No change from earlier today. Acute hematoma right thalamus with intraventricular hemorrhage. No hydrocephalus. Electronically Signed   By: Franchot Gallo M.D.   On: 09/20/2021 15:35   MR ANGIO HEAD WO CONTRAST  Result Date: 09/21/2021 CLINICAL DATA:  61 year old male with  acute right thalamic hemorrhage. EXAM: MRI HEAD WITHOUT AND WITH CONTRAST MRA HEAD WITHOUT CONTRAST TECHNIQUE: Multiplanar, multi-echo pulse sequences of the brain and surrounding structures were acquired without and with intravenous contrast. Angiographic images of the Circle of Willis were acquired using MRA technique without intravenous contrast. CONTRAST:  9m GADAVIST GADOBUTROL 1 MMOL/ML IV SOLN COMPARISON:  Head CT 09/20/2021.  Brain MRI 05/21/2010. FINDINGS: MRI HEAD FINDINGS Brain: Heterogeneous mostly isointense, mixed signal T2 intra-axial hemorrhage centered at the right thalamus with surrounding edema. Blood products encompass 36 by 37 x 36 mm (AP by transverse by CC), approximately 24 mL which is stable since yesterday. Edema tracks into the right midbrain. Small to moderate volume of intraventricular hemorrhage, mostly in the right lateral ventricle. Trace blood layering in the left occipital horn also. No  ventriculomegaly. Trace leftward midline shift. Basilar cisterns remain patent. Susceptibility on DWI associated with the acute blood. No larger area of restricted diffusion. Following contrast no abnormal enhancement in or around the hematoma. But there are other chronic microhemorrhages scattered in the bilateral deep gray matter nuclei, and occasionally elsewhere in both cerebral hemispheres. Several chronic microhemorrhages also noted in the brainstem and cerebellar vermis. But extent is less than amyloid angiopathy. No superficial siderosis identified. No dural thickening identified. Chronic lacunar infarct left corona radiata and basal ganglia. Chronic lacunar infarcts in the left thalamus and occasionally the brainstem. Negative pituitary and cervicomedullary junction. Vascular: Major intracranial vascular flow voids are stable since 2011. Generalized intracranial artery dolichoectasia, especially the dominant distal right vertebral artery. Mild chronic mass effect on the left cerebellopontine angle from dolichoectatic vertebrobasilar junction. Major dural venous sinuses are enhancing and appear to be patent. Skull and upper cervical spine: Negative visible cervical spine. Visualized bone marrow signal is within normal limits. Sinuses/Orbits: Negative. Other: Visible internal auditory structures appear normal. Small volume fluid layering in the pharynx. MRA HEAD FINDINGS Anterior circulation: Antegrade flow in both ICA siphons. Mild siphon irregularity. No siphon stenosis. Normal ophthalmic artery origins. Small right ICA terminus infundibulum suspected (normal variant). Patent carotid termini, MCA and ACA origins. Diminutive or absent anterior communicating artery. Visible bilateral MCA and ACA branches appear normal aside from some tortuosity. No flow signal associated with the acute intra-axial hemorrhage. No regional abnormal vascularity identified. Posterior circulation: Antegrade flow in the  dolichoectasia close tear circulation. No distal vertebral or basilar stenosis. Normal SCA and PCA origins. Posterior communicating arteries are diminutive or absent. Bilateral PCA branches are mildly tortuous and within normal limits. Anatomic variants: None. IMPRESSION: 1. Stable right thalamic hemorrhage since presentation. Regional edema, tracking into the midbrain. 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 No increased intracranial mass effect. 2. No evidence of underlying mass or vascular malformation. But abundant chronic micro-hemorrhages in the brain superimposed on chronic lacunar infarcts compatible with advanced small vessel disease. Blood product extent is less than expected for amyloid angiopathy. 3. Associated intracranial artery dolichoectasia, especially the posterior circulation. No circle-of-Willis stenosis or aneurysm. Electronically Signed   By: HGenevie AnnM.D.   On: 09/21/2021 07:43   MR BRAIN W WO CONTRAST  Result Date: 09/21/2021 CLINICAL DATA:  61year old male with acute right thalamic hemorrhage. EXAM: MRI HEAD WITHOUT AND WITH CONTRAST MRA HEAD WITHOUT CONTRAST TECHNIQUE: Multiplanar, multi-echo pulse sequences of the brain and surrounding structures were acquired without and with intravenous contrast. Angiographic images of the Circle of Willis were acquired using MRA technique without intravenous contrast. CONTRAST:  181mGADAVIST GADOBUTROL 1 MMOL/ML IV SOLN COMPARISON:  Head CT 09/20/2021.  Brain MRI 05/21/2010. FINDINGS: MRI HEAD FINDINGS Brain: Heterogeneous mostly isointense, mixed signal T2 intra-axial hemorrhage centered at the right thalamus with surrounding edema. Blood products encompass 36 by 37 x 36 mm (AP by transverse by CC), approximately 24 mL which is stable since yesterday. Edema tracks into the right midbrain. Small to moderate volume of intraventricular hemorrhage, mostly in the right lateral ventricle. Trace blood layering in the left occipital horn  also. No ventriculomegaly. Trace leftward midline shift. Basilar cisterns remain patent. Susceptibility on DWI associated with the acute blood. No larger area of restricted diffusion. Following contrast no abnormal enhancement in or around the hematoma. But there are other chronic microhemorrhages scattered in the bilateral deep gray matter nuclei, and occasionally elsewhere in both cerebral hemispheres. Several chronic microhemorrhages also noted in the brainstem and cerebellar vermis. But extent is less than amyloid angiopathy. No superficial siderosis identified. No dural thickening identified. Chronic lacunar infarct left corona radiata and basal ganglia. Chronic lacunar infarcts in the left thalamus and occasionally the brainstem. Negative pituitary and cervicomedullary junction. Vascular: Major intracranial vascular flow voids are stable since 2011. Generalized intracranial artery dolichoectasia, especially the dominant distal right vertebral artery. Mild chronic mass effect on the left cerebellopontine angle from dolichoectatic vertebrobasilar junction. Major dural venous sinuses are enhancing and appear to be patent. Skull and upper cervical spine: Negative visible cervical spine. Visualized bone marrow signal is within normal limits. Sinuses/Orbits: Negative. Other: Visible internal auditory structures appear normal. Small volume fluid layering in the pharynx. MRA HEAD FINDINGS Anterior circulation: Antegrade flow in both ICA siphons. Mild siphon irregularity. No siphon stenosis. Normal ophthalmic artery origins. Small right ICA terminus infundibulum suspected (normal variant). Patent carotid termini, MCA and ACA origins. Diminutive or absent anterior communicating artery. Visible bilateral MCA and ACA branches appear normal aside from some tortuosity. No flow signal associated with the acute intra-axial hemorrhage. No regional abnormal vascularity identified. Posterior circulation: Antegrade flow in the  dolichoectasia close tear circulation. No distal vertebral or basilar stenosis. Normal SCA and PCA origins. Posterior communicating arteries are diminutive or absent. Bilateral PCA branches are mildly tortuous and within normal limits. Anatomic variants: None. IMPRESSION: 1. Stable right thalamic hemorrhage since presentation. Regional edema, tracking into the midbrain. 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 No increased intracranial mass effect. 2. No evidence of underlying mass or vascular malformation. But abundant chronic micro-hemorrhages in the brain superimposed on chronic lacunar infarcts compatible with advanced small vessel disease. Blood product extent is less than expected for amyloid angiopathy. 3. Associated intracranial artery dolichoectasia, especially the posterior circulation. No circle-of-Willis stenosis or aneurysm. Electronically Signed   By: Genevie Ann M.D.   On: 09/21/2021 07:43   IR Replc Gastro/Colonic Tube Percut W/Fluoro  Result Date: 10/12/2021 INDICATION: Gastrostomy tube was recently dislodged and replaced with a Foley catheter. Patient needs placement of a new gastrostomy tube. EXAM: GASTROSTOMY TUBE REPLACEMENT WITH FLUOROSCOPY MEDICATIONS: None ANESTHESIA/SEDATION: None CONTRAST:  10 mL Omnipaque 300-administered into the gastric lumen. FLUOROSCOPY: Radiation Exposure Index (as provided by the fluoroscopic device): 4 mGy Kerma COMPLICATIONS: None immediate. PROCEDURE: Consent was obtained for replacement of the gastrostomy tube. Maximal Sterile Barrier Technique was utilized including caps, mask, sterile gowns, sterile gloves, sterile drape, hand hygiene and skin antiseptic. A timeout was performed prior to the initiation of the procedure. Foley catheter was present. Contrast injection confirmed placement in the stomach. Balloon was deflated and the Foley catheter was completely removed. A 5 [place] Kumpe catheter  and stiff Amplatz wire were advanced into the stomach.  Tract was dilated to accommodate an 59 [place] Entuit gastrostomy tube. Balloon was inflated with approximately 5 mL of saline. Contrast injection confirmed placement in the stomach. Tube was flushed with saline. Fluoroscopic images were taken and saved for this procedure. IMPRESSION: Successful replacement of the gastrostomy tube. Patient currently has an 59 French balloon retention tube. Electronically Signed   By: Markus Daft M.D.   On: 10/12/2021 17:55   DG CHEST PORT 1 VIEW  Result Date: 10/09/2021 CLINICAL DATA:  A 61 year old male presents for evaluation of leukocytosis. EXAM: PORTABLE CHEST 1 VIEW COMPARISON:  October 06, 2021. FINDINGS: Tracheostomy tube is in place with similar appearance, tip between clavicular heads in the mid trachea. Cardiomediastinal contours and hilar structures are stable with accentuation of cardiac silhouette, likely with mild enlargement due to portable technique and patient with AP projection. No signs of lobar consolidation. Linear opacity in the RIGHT mid chest is unchanged since the most recent prior, new since January of 2023. Subtle patchy opacities at the LEFT base as well not substantially changed accounting for technique differences on the current as compared to the prior study. On limited assessment there is no acute skeletal process. IMPRESSION: Stable exam with appearance of mild patchy airspace opacities at the bases potentially atelectasis or infection. Electronically Signed   By: Zetta Bills M.D.   On: 10/09/2021 14:04   DG Chest Port 1 View  Result Date: 10/06/2021 CLINICAL DATA:  Fever EXAM: PORTABLE CHEST 1 VIEW COMPARISON:  10/01/2021, 09/26/2021 FINDINGS: Patchy airspace opacity at the left base. No pleural effusion. Atelectasis right base. Enlarged cardiomediastinal silhouette without overt pulmonary edema. No pneumothorax IMPRESSION: Mild patchy airspace opacity at the left greater than right lung base may be due to atelectasis or mild pneumonia  Electronically Signed   By: Donavan Foil M.D.   On: 10/06/2021 20:04   DG CHEST PORT 1 VIEW  Result Date: 10/01/2021 CLINICAL DATA:  Fever.  Stroke. EXAM: PORTABLE CHEST 1 VIEW COMPARISON:  09/29/2021 FINDINGS: Tracheostomy tube noted, the tracheal air shadow around this tube is indistinct but the tube projects in a reasonable location compared to the expected location of the trachea. A feeding tube extends down into the stomach and beyond the inferior margin of imaging. Borderline enlargement of the cardiopericardial silhouette. Mild interstitial accentuation at the lung bases with some indistinctness of the pulmonary vasculature. I do not see well-defined Kerley B lines. Increased linear bandlike opacity above the right hemidiaphragm. Mildly reduced bandlike opacity in the right mid lung. Mildly reduced bandlike opacities in the retrocardiac region. IMPRESSION: 1. Improved aeration in the right mid lung and left lung base although there is some increase indistinct bandlike density above the right hemidiaphragm probably from atelectasis, less likely to be from developing pneumonia. 2. Borderline enlargement of the cardiopericardial silhouette. Mild indistinctness of the pulmonary vasculature with mild interstitial accentuation in the lung bases, pulmonary venous hypertension with borderline interstitial edema is a possibility. Electronically Signed   By: Van Clines M.D.   On: 10/01/2021 13:35   DG Chest Port 1 View  Result Date: 09/29/2021 CLINICAL DATA:  Tracheostomy present EXAM: PORTABLE CHEST 1 VIEW.  Patient is rotated COMPARISON:  Chest x-ray 09/27/2021 FINDINGS: Tracheostomy with tip terminating approximately 2 cm above the carina. Enteric tube coursing below the hemidiaphragm with tip collimated off view. The heart and mediastinal contours are unchanged. Slightly more prominent hilar vasculature. Interval development of linear airspace opacity overlying  the right mid lung zone. Slight  increased bilateral lower lung zone patchy airspace opacities. No pulmonary edema. No pleural effusion. No pneumothorax. No acute osseous abnormality. IMPRESSION: 1. Slight increased bilateral lower lung zone patchy airspace opacities. Finding may represent a combination of atelectasis versus infection/inflammation. Followup PA and lateral chest X-ray is recommended in 3-4 weeks following therapy to ensure resolution. 2. Pulmonary venous congestion. Electronically Signed   By: Iven Finn M.D.   On: 09/29/2021 15:18   DG Chest Port 1 View  Result Date: 09/27/2021 CLINICAL DATA:  Chest congestion. EXAM: PORTABLE CHEST 1 VIEW COMPARISON:  Chest x-ray 09/26/2021. FINDINGS: Endotracheal tube tip is 3.3 cm above the carina. Enteric tube tip is in the distal stomach. Right upper extremity PICC terminates in the mid SVC. Cardiomediastinal silhouette is stable. There is stable left basilar atelectasis/airspace disease. There is no new lung consolidation, pleural effusion or pneumothorax. No acute fractures are seen. IMPRESSION: 1. Lines and tubes appear in adequate position. 2. Stable left basilar atelectasis/airspace disease. Electronically Signed   By: Ronney Asters M.D.   On: 09/27/2021 19:07   DG CHEST PORT 1 VIEW  Result Date: 09/26/2021 CLINICAL DATA:  Follow-up intubated patient. EXAM: PORTABLE CHEST 1 VIEW COMPARISON:  09/22/2021 and earlier studies. FINDINGS: Endotracheal tube tip projects 3 cm above the carina. Enteric tube passes below the diaphragm, tip in the distal stomach. Right PICC tip projects in the lower superior vena cava. Support apparatus is stable. Cardiac silhouette is normal in size. Small area of left lung base opacity, which may reflect atelectasis or infection. Remainder of the lungs is clear. Lungs otherwise clear. No convincing pleural effusion or pneumothorax. IMPRESSION: 1. Small area of left lung base opacity consistent with atelectasis or pneumonia. Remainder of the lungs is  clear. 2. Support apparatus is stable and well positioned. Electronically Signed   By: Lajean Manes M.D.   On: 09/26/2021 10:42   DG Chest Port 1 View  Result Date: 09/22/2021 CLINICAL DATA:  Acute respiratory failure EXAM: PORTABLE CHEST 1 VIEW COMPARISON:  Yesterday FINDINGS: Endotracheal tube with tip between the clavicular heads and carina. Feeding tube with tip at the distal stomach. Right PICC with tip at the distal SVC. Low volume chest with indistinct perihilar opacity that could be atelectasis or infiltrate. No effusion, edema, or pneumothorax. IMPRESSION: Unremarkable hardware. Perihilar atelectasis or infiltrate. Electronically Signed   By: Jorje Guild M.D.   On: 09/22/2021 04:57   DG CHEST PORT 1 VIEW  Result Date: 09/21/2021 CLINICAL DATA:  Endotracheal tube EXAM: PORTABLE CHEST 1 VIEW COMPARISON:  Chest x-ray 09/20/2021 FINDINGS: Endotracheal tube tip is now 1.6 cm above the carina. Enteric tube tip below the diaphragm. Right PICC tip near the cavoatrial junction. Cardiomediastinal silhouette is unchanged. Calcified plaques in the aortic arch. Pulmonary vasculature is normal. No focal consolidation identified. Improved opacities in the left lung since previous study. No pleural effusion or pneumothorax. IMPRESSION: Medical devices as described.  No acute abnormality identified. Electronically Signed   By: Ofilia Neas M.D.   On: 09/21/2021 11:10   DG Chest Portable 1 View  Addendum Date: 09/20/2021   ADDENDUM REPORT: 09/20/2021 11:45 ADDENDUM: Study discussed by telephone with Dr. Lorrin Goodell on 09/20/2021 at 1124 hours. Electronically Signed   By: Genevie Ann M.D.   On: 09/20/2021 11:45   Result Date: 09/20/2021 CLINICAL DATA:  61 year old male right thalamic hemorrhage. Intubated. EXAM: PORTABLE CHEST 1 VIEW COMPARISON:  Chest radiographs 03/04/2020. FINDINGS: AP view at 1049 hours.  Mainstem bronchus intubation on the right. Endotracheal tube retraction of 2 cm recommended. Enteric  tube courses to the abdomen, tip not included. Lower lung volumes. Mediastinal contours within normal limits. Allowing for portable technique the lungs are clear. No pneumothorax or pleural effusion. No acute osseous abnormality identified. IMPRESSION: 1. Right mainstem bronchus intubation. ET tube retraction of 2 cm recommended with repeat portable chest x-ray to confirm placement. 2. Lower lung volumes with no acute cardiopulmonary abnormality. Electronically Signed: By: Genevie Ann M.D. On: 09/20/2021 11:10   DG Abd Portable 1V  Result Date: 09/21/2021 CLINICAL DATA:  Feeding tube placement EXAM: PORTABLE ABDOMEN - 1 VIEW COMPARISON:  Chest radiograph 09/21/2021 FINDINGS: The feeding tube tip projects in the expected vicinity of the pyloric bulb. This could be confirmed with an injection of contrast if clinically warranted. The lung bases appear clear. Formed stool in the colon. Mostly gasless upper abdominal small bowel. Thoracic and lumbar spondylosis. IMPRESSION: 1. The feeding tube tip projects over the expected location of the pyloric bulb. Electronically Signed   By: Van Clines M.D.   On: 09/21/2021 13:30   DG Swallowing Func-Speech Pathology  Result Date: 10/13/2021 Table formatting from the original result was not included. Objective Swallowing Evaluation: Type of Study: MBS-Modified Barium Swallow Study  Patient Details Name: Dimitrius Steedman MRN: [id number] Date of Birth: 1961-02-18 Today's Date: 10/13/2021 Time: SLP Start Time (ACUTE ONLY): 1310 -SLP Stop Time (ACUTE ONLY): 1325 SLP Time Calculation (min) (ACUTE ONLY): 15 min Past Medical History: No past medical history on file. Past Surgical History: Past Surgical History: Procedure Laterality Date  ESOPHAGOGASTRODUODENOSCOPY N/A 10/02/2021  Procedure: ESOPHAGOGASTRODUODENOSCOPY (EGD);  Surgeon: Jesusita Oka, MD;  Location: [facility] ENDOSCOPY;  Service: General;  Laterality: N/A;  IR [place] PERCUT W/FLUORO  10/12/2021  PEG  PLACEMENT N/A 10/02/2021  Procedure: PERCUTANEOUS ENDOSCOPIC GASTROSTOMY (PEG) PLACEMENT;  Surgeon: Jesusita Oka, MD;  Location: [facility] ENDOSCOPY;  Service: General;  Laterality: N/A; HPI: Pt is a 61 y/o male who presented with left sided weakness, dysarthria, and right fixed gaze. Imaging revealed R thalamic hemorrhage, edema and trace leftward midline shift. Pt intubated in ED for increasing somnolence and concerns regarding airway protection. ETT 1/22-1/29 failed extubation due to secretions; reintubated 1/29-trach 1/31. PMH: A-fib on coumadin, HTN, HLD, previous smoker, prior CVA, DM, OSA.  Subjective: awake, alert, difficulty following commands, distracted  Recommendations for follow up therapy are one component of a multi-disciplinary discharge planning process, led by the attending physician.  Recommendations may be updated based on patient status, additional functional criteria and insurance authorization. Assessment / Plan / Recommendation Clinical Impressions 10/13/2021 Clinical Impression Patient presents with a severe oropharyngeal dysphagia with suspected significant impact from cognitive impairment. Patient appeared distracted and did not consistently follow commands. SLP performed oral care via suction and removed mild amount of secretions. PMV was briefly donned but patient did not appear to tolerate and it was quickly removed. Patient exhibited a mod-severe oral and severe pharyngeal dysphagia. During oral phase, anterior to posterior transit was delayed of nectar thick liquid via spoon sip and puree solid via spoon. In addition, patient had oral residuals on tongue post initial swallow. During pharyngeal phase, swallow was initiated at level of vallecular sinus, however patient exhibited delays of at least 90 seconds before swallow was initiated. Only trace vallecular residuals observed with nectar thick liquids and mild residuals with puree solids. No penetration or aspiration. SLP is recommending  continue NPO status and continue with PO trials of  nectar thick liquids via spoon and puree solids via spoon with SLP only. SLP Visit Diagnosis Dysphagia, oropharyngeal phase (R13.12) Attention and concentration deficit following -- Frontal lobe and executive function deficit following -- Impact on safety and function Severe aspiration risk;Risk for inadequate nutrition/hydration   Treatment Recommendations 10/13/2021 Treatment Recommendations Therapy as outlined in treatment plan below   Prognosis 10/13/2021 Prognosis for Safe Diet Advancement Good Barriers to Reach Goals Severity of deficits Barriers/Prognosis Comment -- Diet Recommendations 10/13/2021 SLP Diet Recommendations NPO;Other (Comment) Liquid Administration via -- Medication Administration -- Compensations -- Postural Changes --   Other Recommendations 10/13/2021 Recommended Consults -- Oral Care Recommendations Oral care QID;Staff/trained caregiver to provide oral care Other Recommendations -- Follow Up Recommendations Skilled nursing-short term rehab (<3 hours/day) Assistance recommended at discharge Frequent or constant Supervision/Assistance Functional Status Assessment Patient has had a recent decline in their functional status and demonstrates the ability to make significant improvements in function in a reasonable and predictable amount of time. Frequency and Duration  10/13/2021 Speech Therapy Frequency (ACUTE ONLY) min 2x/week Treatment Duration 2 weeks   Oral Phase 10/13/2021 Oral Phase -- Oral - Pudding Teaspoon -- Oral - Pudding Cup -- Oral - Honey Teaspoon -- Oral - Honey Cup -- Oral - Nectar Teaspoon -- Oral - Nectar Cup -- Oral - Nectar Straw -- Oral - Thin Teaspoon NT Oral - Thin Cup -- Oral - Thin Straw -- Oral - Puree Weak lingual manipulation;Reduced posterior propulsion;Delayed oral transit;Premature spillage Oral - Mech Soft -- Oral - Regular -- Oral - Multi-Consistency -- Oral - Pill -- Oral Phase - Comment --  Pharyngeal Phase 10/13/2021  Pharyngeal Phase Impaired Pharyngeal- Pudding Teaspoon -- Pharyngeal -- Pharyngeal- Pudding Cup -- Pharyngeal -- Pharyngeal- Honey Teaspoon -- Pharyngeal -- Pharyngeal- Honey Cup -- Pharyngeal -- Pharyngeal- Nectar Teaspoon Delayed swallow initiation-vallecula;Pharyngeal residue - valleculae Pharyngeal -- Pharyngeal- Nectar Cup -- Pharyngeal -- Pharyngeal- Nectar Straw -- Pharyngeal -- Pharyngeal- Thin Teaspoon -- Pharyngeal -- Pharyngeal- Thin Cup -- Pharyngeal -- Pharyngeal- Thin Straw -- Pharyngeal -- Pharyngeal- Puree Delayed swallow initiation-vallecula;Pharyngeal residue - valleculae Pharyngeal -- Pharyngeal- Mechanical Soft -- Pharyngeal -- Pharyngeal- Regular -- Pharyngeal -- Pharyngeal- Multi-consistency -- Pharyngeal -- Pharyngeal- Pill -- Pharyngeal -- Pharyngeal Comment --  Cervical Esophageal Phase  10/13/2021 Cervical Esophageal Phase WFL Pudding Teaspoon -- Pudding Cup -- Honey Teaspoon -- Honey Cup -- Nectar Teaspoon -- Nectar Cup -- Nectar Straw -- Thin Teaspoon -- Thin Cup -- Thin Straw -- Puree -- Mechanical Soft -- Regular -- Multi-consistency -- Pill -- Cervical Esophageal Comment -- Sonia Baller, MA, CCC-SLP Speech Therapy                     ECHOCARDIOGRAM COMPLETE  Result Date: 09/21/2021    ECHOCARDIOGRAM REPORT   Patient Name:   ZAUL HUBERS Date of Exam: 09/21/2021 Medical Rec #:  [id number]        Height:       64.0 in Accession #:    [id number]       Weight:       205.0 lb Date of Birth:  [birth year]         BSA:          1.977 m Patient Age:    23 years         BP:           142/63 mmHg Patient Gender: M  HR:           65 bpm. Exam Location:  Inpatient Procedure: 2D Echo Indications:    stroke  History:        Patient has no prior history of Echocardiogram examinations.                 Risk Factors:Sleep Apnea and Diabetes.  Sonographer:    Charlack Referring Phys: [id number] [facility]  Sonographer Comments: Echo performed with patient supine and  on artificial respirator. IMPRESSIONS  1. Left ventricular ejection fraction, by estimation, is 60 to 65%. The left ventricle has normal function. The left ventricle has no regional wall motion abnormalities. Left ventricular diastolic parameters were normal.  2. Right ventricular systolic function is normal. The right ventricular size is normal.  3. The mitral valve is normal in structure. No evidence of mitral valve regurgitation. No evidence of mitral stenosis.  4. The aortic valve is tricuspid. There is mild calcification of the aortic valve. There is mild thickening of the aortic valve. Aortic valve regurgitation is not visualized. Aortic valve sclerosis is present, with no evidence of aortic valve stenosis.  5. The inferior vena cava is normal in size with greater than 50% respiratory variability, suggesting right atrial pressure of 3 mmHg. FINDINGS  Left Ventricle: Left ventricular ejection fraction, by estimation, is 60 to 65%. The left ventricle has normal function. The left ventricle has no regional wall motion abnormalities. Definity contrast agent was given IV to delineate the left ventricular  endocardial borders. The left ventricular internal cavity size was normal in size. There is no left ventricular hypertrophy. Left ventricular diastolic parameters were normal. Right Ventricle: The right ventricular size is normal. No increase in right ventricular wall thickness. Right ventricular systolic function is normal. Left Atrium: Left atrial size was normal in size. Right Atrium: Right atrial size was normal in size. Pericardium: There is no evidence of pericardial effusion. Mitral Valve: The mitral valve is normal in structure. No evidence of mitral valve regurgitation. No evidence of mitral valve stenosis. Tricuspid Valve: The tricuspid valve is normal in structure. Tricuspid valve regurgitation is not demonstrated. No evidence of tricuspid stenosis. Aortic Valve: The aortic valve is tricuspid. There is  mild calcification of the aortic valve. There is mild thickening of the aortic valve. Aortic valve regurgitation is not visualized. Aortic valve sclerosis is present, with no evidence of aortic valve stenosis. Aortic valve mean gradient measures 9.0 mmHg. Aortic valve peak gradient measures 19.2 mmHg. Aortic valve area, by VTI measures 2.08 cm. Pulmonic Valve: The pulmonic valve was normal in structure. Pulmonic valve regurgitation is not visualized. No evidence of pulmonic stenosis. Aorta: The aortic root is normal in size and structure. Venous: The inferior vena cava is normal in size with greater than 50% respiratory variability, suggesting right atrial pressure of 3 mmHg. IAS/Shunts: No atrial level shunt detected by color flow Doppler.  LEFT VENTRICLE PLAX 2D LVIDd:         4.70 cm   Diastology LVIDs:         3.10 cm   LV e' medial:    8.38 cm/s LV PW:         1.00 cm   LV E/e' medial:  14.4 LV IVS:        1.00 cm   LV e' lateral:   10.60 cm/s LVOT diam:     1.90 cm   LV E/e' lateral: 11.4 LV SV:  89 LV SV Index:   45 LVOT Area:     2.84 cm  RIGHT VENTRICLE             IVC RV S prime:     15.00 cm/s  IVC diam: 2.20 cm TAPSE (M-mode): 2.4 cm LEFT ATRIUM             Index        RIGHT ATRIUM           Index LA diam:        3.60 cm 1.82 cm/m   RA Area:     15.10 cm LA Vol (A2C):   40.6 ml 20.54 ml/m  RA Volume:   37.10 ml  18.77 ml/m LA Vol (A4C):   73.4 ml 37.13 ml/m LA Biplane Vol: 56.4 ml 28.53 ml/m  AORTIC VALVE AV Area (Vmax):    1.88 cm AV Area (Vmean):   2.13 cm AV Area (VTI):     2.08 cm AV Vmax:           219.00 cm/s AV Vmean:          137.000 cm/s AV VTI:            0.430 m AV Peak Grad:      19.2 mmHg AV Mean Grad:      9.0 mmHg LVOT Vmax:         145.00 cm/s LVOT Vmean:        103.000 cm/s LVOT VTI:          0.315 m LVOT/AV VTI ratio: 0.73  AORTA Ao Root diam: 2.90 cm Ao Asc diam:  3.10 cm MITRAL VALVE MV Area (PHT): 3.17 cm     SHUNTS MV Decel Time: 239 msec     Systemic VTI:  0.32  m MV E velocity: 121.00 cm/s  Systemic Diam: 1.90 cm MV A velocity: 103.00 cm/s MV E/A ratio:  1.17 Jenkins Rouge MD Electronically signed by Jenkins Rouge MD Signature Date/Time: 09/21/2021/11:31:42 AM    Final    CT HEAD CODE STROKE WO CONTRAST  Result Date: 09/20/2021 CLINICAL DATA:  Code stroke.  61 year old male. EXAM: CT HEAD WITHOUT CONTRAST TECHNIQUE: Contiguous axial images were obtained from the base of the skull through the vertex without intravenous contrast. RADIATION DOSE REDUCTION: This exam was performed according to the departmental dose-optimization program which includes automated exposure control, adjustment of the mA and/or kV according to patient size and/or use of iterative reconstruction technique. COMPARISON:  None available. FINDINGS: Brain: Hyperdense intra-axial hemorrhage centered at the right thalamus and posterior lentiform with intraventricular extension. Intra-axial component of blood encompasses 36 by 36 x 38 mm (AP by transverse by CC) for an estimated intra-axial blood volume of 25 mL. Moderate volume right lateral ventricle IVH, but no ventriculomegaly. Regional brain edema. Only trace leftward midline shift. Superimposed chronic appearing lacunar infarcts left thalamus and corona radiata. Normal basilar cisterns. No superimposed acute cortically based infarct. No other intracranial hemorrhage identified. Vascular: Calcified atherosclerosis at the skull base. No suspicious intracranial vascular hyperdensity. Skull: Negative. Sinuses/Orbits: Visualized paranasal sinuses and mastoids are clear. Other: Visualized orbit soft tissues are within normal limits. Visualized scalp soft tissues are within normal limits. ASPECTS [facility] Stroke Program Early CT Score) Total score (0-10 with 10 being normal): Not applicable, acute intra-axial hemorrhage. IMPRESSION: 1. 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 Estimated intra-axial blood volume 25 mL. Mild  Regional brain edema. Trace leftward midline shift. 2. Underlying chronic small  vessel disease. 3. These results were communicated to Dr. Lorrin Goodell at 9:19 am on 09/20/2021 by text page via the [facility] messaging system. And was discussed with him by telephone at 09:20 . Electronically Signed   By: Genevie Ann M.D.   On: 09/20/2021 09:24   VAS US CAROTID  Result Date: 09/22/2021 Carotid Arterial Duplex Study Patient Name:  CHASKE PASKETT  Date of Exam:   09/21/2021 Medical Rec #: [id number]         Accession #:    [id number] Date of Birth: [birth year]          Patient Gender: M Patient Age:   89 years Exam Location:  [facility] Procedure:      VAS US CAROTID Referring Phys: Rosalin Hawking --------------------------------------------------------------------------------  Indications:       CVA. Risk Factors:      Hypertension, Diabetes. Limitations        Today's exam was limited due to the body habitus of the                    patient and patient unable to reposition. Comparison Study:  No prior studies. Performing Technologist: Darlin Coco RDMS, RVT  Examination Guidelines: A complete evaluation includes B-mode imaging, spectral Doppler, color Doppler, and power Doppler as needed of all accessible portions of each vessel. Bilateral testing is considered an integral part of a complete examination. Limited examinations for reoccurring indications may be performed as noted.  Right Carotid Findings: +----------+--------+--------+--------+------------------+-------------------+             PSV cm/s EDV cm/s Stenosis Plaque Description Comments             +----------+--------+--------+--------+------------------+-------------------+  CCA Prox   94       11                                                        +----------+--------+--------+--------+------------------+-------------------+  CCA Distal 85       10                                   intimal thickening    +----------+--------+--------+--------+------------------+-------------------+  ICA Prox   52       10       1-39%    heterogenous                            +----------+--------+--------+--------+------------------+-------------------+  ICA Distal                                               Unable to visualize  +----------+--------+--------+--------+------------------+-------------------+  ECA        88       9                                                         +----------+--------+--------+--------+------------------+-------------------+ +----------+--------+-------+----------------+-------------------+  PSV cm/s EDV cms Describe         Arm Pressure (mmHG)  +----------+--------+-------+----------------+-------------------+  Subclavian 113              Multiphasic, WNL                      +----------+--------+-------+----------------+-------------------+ +---------+--------+--+--------+--+---------+  Vertebral PSV cm/s 83 EDV cm/s 24 Antegrade  +---------+--------+--+--------+--+---------+  Left Carotid Findings: +----------+--------+--------+--------+----------------------------+--------+             PSV cm/s EDV cm/s Stenosis Plaque Description           Comments  +----------+--------+--------+--------+----------------------------+--------+  CCA Prox   152      17                                                       +----------+--------+--------+--------+----------------------------+--------+  CCA Distal 104      16                hyperechoic and heterogenous           +----------+--------+--------+--------+----------------------------+--------+  ICA Prox   73       18       1-39%    heterogenous                           +----------+--------+--------+--------+----------------------------+--------+  ICA Distal 113      27                                                       +----------+--------+--------+--------+----------------------------+--------+  ECA        132                                                                +----------+--------+--------+--------+----------------------------+--------+ +----------+--------+--------+----------------+-------------------+             PSV cm/s EDV cm/s Describe         Arm Pressure (mmHG)  +----------+--------+--------+----------------+-------------------+  Subclavian 138               Multiphasic, WNL                      +----------+--------+--------+----------------+-------------------+ +---------+--------+--------+--------------+  Vertebral PSV cm/s EDV cm/s Not identified  +---------+--------+--------+--------------+   Summary: Right Carotid: Velocities in the right ICA are consistent with a 1-39% stenosis. Left Carotid: Velocities in the left ICA are consistent with a 1-39% stenosis. Vertebrals:  Right vertebral artery demonstrates antegrade flow. Left vertebral              artery was not visualized. Subclavians: Normal flow hemodynamics were seen in bilateral subclavian              arteries. *See table(s) above for measurements and observations.  Electronically signed by Antony Contras MD on 09/22/2021 at 1:01:36 PM.    Final    [place] EKG SITE RITE  Result Date: 09/20/2021 If  Site Rite image not attached, placement could not be confirmed due to current cardiac rhythm.   Microbiology: Results for orders placed or performed during the hospital encounter of 09/20/21  Resp Panel by RT-PCR (Flu A&B, Covid) Nasopharyngeal Swab     Status: None   Collection Time: 09/20/21 10:47 AM   Specimen: Nasopharyngeal Swab; Nasopharyngeal(NP) swabs in vial transport medium  Result Value Ref Range Status   SARS Coronavirus 2 by RT PCR NEGATIVE NEGATIVE Final    Comment: (NOTE) SARS-CoV-2 target nucleic acids are NOT DETECTED.  The SARS-CoV-2 RNA is generally detectable in upper respiratory specimens during the acute phase of infection. The lowest concentration of SARS-CoV-2 viral copies this assay can detect is 138 copies/mL. A negative result does not  preclude SARS-Cov-2 infection and should not be used as the sole basis for treatment or other patient management decisions. A negative result may occur with  improper specimen collection/handling, submission of specimen other than nasopharyngeal swab, presence of viral mutation(s) within the areas targeted by this assay, and inadequate number of viral copies(<138 copies/mL). A negative result must be combined with clinical observations, patient history, and epidemiological information. The expected result is Negative.  Fact Sheet for Patients:  EntrepreneurPulse.com.au  Fact Sheet for Healthcare Providers:  IncredibleEmployment.be  This test is no t yet approved or cleared by the [place] FDA and  has been authorized for detection and/or diagnosis of SARS-CoV-2 by FDA under an Emergency Use Authorization (EUA). This EUA will remain  in effect (meaning this test can be used) for the duration of the COVID-19 declaration under Section 564(b)(1) of the Act, 21 U.S.C.section 360bbb-3(b)(1), unless the authorization is terminated  or revoked sooner.       Influenza A by PCR NEGATIVE NEGATIVE Final   Influenza B by PCR NEGATIVE NEGATIVE Final    Comment: (NOTE) The Xpert Xpress SARS-CoV-2/FLU/RSV plus assay is intended as an aid in the diagnosis of influenza from Nasopharyngeal swab specimens and should not be used as a sole basis for treatment. Nasal washings and aspirates are unacceptable for Xpert Xpress SARS-CoV-2/FLU/RSV testing.  Fact Sheet for Patients: EntrepreneurPulse.com.au  Fact Sheet for Healthcare Providers: IncredibleEmployment.be  This test is not yet approved or cleared by the [place] FDA and has been authorized for detection and/or diagnosis of SARS-CoV-2 by FDA under an Emergency Use Authorization (EUA). This EUA will remain in effect (meaning this test can be used) for the  duration of the COVID-19 declaration under Section 564(b)(1) of the Act, 21 U.S.C. section 360bbb-3(b)(1), unless the authorization is terminated or revoked.  Performed at [facility], [place] [address], [place], [place] [address]   MRSA Next Gen by PCR, Nasal     Status: None   Collection Time: 09/20/21 11:48 AM   Specimen: Nasal Mucosa; Nasal Swab  Result Value Ref Range Status   MRSA by PCR Next Gen NOT DETECTED NOT DETECTED Final    Comment: (NOTE) The GeneXpert MRSA Assay (FDA approved for NASAL specimens only), is one component of a comprehensive MRSA colonization surveillance program. It is not intended to diagnose MRSA infection nor to guide or monitor treatment for MRSA infections. Test performance is not FDA approved in patients less than 68 years old. Performed at [facility], [place] [address], [place], [place] [address]   Culture, Respiratory w Gram Stain     Status: None   Collection Time: 09/22/21  1:08 AM   Specimen: Tracheal Aspirate; Respiratory  Result Value Ref  Range Status   Specimen Description TRACHEAL ASPIRATE  Final   Special Requests NONE  Final   Gram Stain   Final    MODERATE WBC PRESENT, PREDOMINANTLY PMN NO ORGANISMS SEEN    Culture   Final    NO GROWTH 2 DAYS Performed at [facility], [address] [place], [place] [address]    Report Status 09/25/2021 FINAL  Final  Culture, blood (routine x 2)     Status: Abnormal   Collection Time: 09/22/21  4:48 AM   Specimen: BLOOD LEFT HAND  Result Value Ref Range Status   Specimen Description BLOOD LEFT HAND  Final   Special Requests   Final    BOTTLES DRAWN AEROBIC AND ANAEROBIC Blood Culture adequate volume   Culture  Setup Time   Final    GRAM POSITIVE COCCI IN CLUSTERS AEROBIC BOTTLE ONLY CRITICAL RESULT CALLED TO, READ BACK BY AND VERIFIED WITH: B MANCHERIL,PHARMD_0  09/23/21 [place]    Culture (A)  Final    STAPHYLOCOCCUS EPIDERMIDIS THE SIGNIFICANCE OF ISOLATING THIS ORGANISM  FROM A SINGLE SET OF BLOOD CULTURES WHEN MULTIPLE SETS ARE DRAWN IS UNCERTAIN. PLEASE NOTIFY THE MICROBIOLOGY DEPARTMENT WITHIN ONE WEEK IF SPECIATION AND SENSITIVITIES ARE REQUIRED. Performed at [facility], [place] [address], [place], [place] [address]    Report Status 09/24/2021 FINAL  Final  Blood Culture ID Panel (Reflexed)     Status: Abnormal   Collection Time: 09/22/21  4:48 AM  Result Value Ref Range Status   Enterococcus faecalis NOT DETECTED NOT DETECTED Final   Enterococcus Faecium NOT DETECTED NOT DETECTED Final   Listeria monocytogenes NOT DETECTED NOT DETECTED Final   Staphylococcus species DETECTED (A) NOT DETECTED Final    Comment: CRITICAL RESULT CALLED TO, READ BACK BY AND VERIFIED WITH: B MANCHERIL,PHARMD_1  09/23/21 [place]    Staphylococcus aureus (BCID) NOT DETECTED NOT DETECTED Final   Staphylococcus epidermidis DETECTED (A) NOT DETECTED Final    Comment: Methicillin (oxacillin) resistant coagulase negative staphylococcus. Possible blood culture contaminant (unless isolated from more than one blood culture draw or clinical case suggests pathogenicity). No antibiotic treatment is indicated for blood  culture contaminants. CRITICAL RESULT CALLED TO, READ BACK BY AND VERIFIED WITH: B MANCHERIL,PHARMD_2  09/23/21 [place]    Staphylococcus lugdunensis NOT DETECTED NOT DETECTED Final   Streptococcus species NOT DETECTED NOT DETECTED Final   Streptococcus agalactiae NOT DETECTED NOT DETECTED Final   Streptococcus pneumoniae NOT DETECTED NOT DETECTED Final   Streptococcus pyogenes NOT DETECTED NOT DETECTED Final   A.calcoaceticus-baumannii NOT DETECTED NOT DETECTED Final   Bacteroides fragilis NOT DETECTED NOT DETECTED Final   Enterobacterales NOT DETECTED NOT DETECTED Final   Enterobacter cloacae complex NOT DETECTED NOT DETECTED Final   Escherichia coli NOT DETECTED NOT DETECTED Final   Klebsiella aerogenes NOT DETECTED NOT DETECTED Final   Klebsiella oxytoca NOT  DETECTED NOT DETECTED Final   Klebsiella pneumoniae NOT DETECTED NOT DETECTED Final   Proteus species NOT DETECTED NOT DETECTED Final   Salmonella species NOT DETECTED NOT DETECTED Final   Serratia marcescens NOT DETECTED NOT DETECTED Final   Haemophilus influenzae NOT DETECTED NOT DETECTED Final   Neisseria meningitidis NOT DETECTED NOT DETECTED Final   Pseudomonas aeruginosa NOT DETECTED NOT DETECTED Final   Stenotrophomonas maltophilia NOT DETECTED NOT DETECTED Final   Candida albicans NOT DETECTED NOT DETECTED Final   Candida auris NOT DETECTED NOT DETECTED Final   Candida glabrata NOT DETECTED NOT DETECTED Final   Candida krusei NOT DETECTED  NOT DETECTED Final   Candida parapsilosis NOT DETECTED NOT DETECTED Final   Candida tropicalis NOT DETECTED NOT DETECTED Final   Cryptococcus neoformans/gattii NOT DETECTED NOT DETECTED Final   Methicillin resistance mecA/C DETECTED (A) NOT DETECTED Final    Comment: CRITICAL RESULT CALLED TO, READ BACK BY AND VERIFIED WITH: B MANCHERIL,PHARMD_0  09/23/21 [place] Performed at [facility], [place] [address] [place], [place] [address]   Culture, blood (routine x 2)     Status: None   Collection Time: 09/22/21  5:17 AM   Specimen: BLOOD  Result Value Ref Range Status   Specimen Description BLOOD BLOOD LEFT WRIST  Final   Special Requests   Final    BOTTLES DRAWN AEROBIC AND ANAEROBIC Blood Culture adequate volume   Culture   Final    NO GROWTH 5 DAYS Performed at [facility], [place] [address] [place], [place] [address]    Report Status 09/27/2021 FINAL  Final  Culture, blood (Routine X 2) w Reflex to ID Panel     Status: None   Collection Time: 09/25/21  5:05 PM   Specimen: BLOOD  Result Value Ref Range Status   Specimen Description BLOOD LEFT ANTECUBITAL  Final   Special Requests   Final    BOTTLES DRAWN AEROBIC AND ANAEROBIC Blood Culture adequate volume   Culture   Final    NO GROWTH 5 DAYS Performed at [place], [place] [address] [place], [place] [address]    Report Status 09/30/2021 FINAL  Final  Culture, blood (Routine X 2) w Reflex to ID Panel     Status: None   Collection Time: 09/25/21  5:10 PM   Specimen: BLOOD LEFT WRIST  Result Value Ref Range Status   Specimen Description BLOOD LEFT WRIST  Final   Special Requests   Final    BOTTLES DRAWN AEROBIC AND ANAEROBIC Blood Culture adequate volume   Culture   Final    NO GROWTH 5 DAYS Performed at [facility], [place] [address], [place], [place] [address]    Report Status 09/30/2021 FINAL  Final  Urine Culture     Status: None   Collection Time: 09/30/21  6:25 PM   Specimen: Urine, Catheterized  Result Value Ref Range Status   Specimen Description URINE, CATHETERIZED  Final   Special Requests NONE  Final   Culture   Final    NO GROWTH Performed at [facility], [place] [address], [place],  [address]    Report Status 10/02/2021 FINAL  Final  Expectorated Sputum Assessment w Gram Stain, Rflx to Resp Cult     Status: None   Collection Time: 10/05/21  1:45 PM   Specimen: Expectorated Sputum  Result Value Ref Range Status   Specimen Description EXPECTORATED SPUTUM  Final   Special Requests NONE  Final   Sputum evaluation   Final    THIS SPECIMEN IS ACCEPTABLE FOR SPUTUM CULTURE Performed at [facility], [place] [address] [place],  [address]    Report Status 10/07/2021 FINAL  Final  Culture, Respiratory w Gram Stain     Status: None   Collection Time: 10/05/21  1:45 PM  Result Value Ref Range Status   Specimen Description EXPECTORATED SPUTUM  Final   Special Requests NONE Reflexed from [id number]  Final   Gram Stain   Final    ABUNDANT WBC PRESENT, PREDOMINANTLY PMN FEW GRAM NEGATIVE RODS FEW GRAM POSITIVE COCCI IN PAIRS  Culture   Final    RARE Normal respiratory flora-no Staph aureus or Pseudomonas seen Performed at [place] [address] [place], [place] [address]    Report Status 10/08/2021  FINAL  Final  Culture, blood (routine x 2)     Status: None   Collection Time: 10/09/21  9:21 AM   Specimen: BLOOD  Result Value Ref Range Status   Specimen Description BLOOD BLOOD LEFT HAND  Final   Special Requests   Final    BOTTLES DRAWN AEROBIC AND ANAEROBIC Blood Culture adequate volume   Culture   Final    NO GROWTH 5 DAYS Performed at [facility], [place] [address] [place], [place] [address]    Report Status 10/14/2021 FINAL  Final  Culture, blood (routine x 2)     Status: None   Collection Time: 10/09/21  9:22 AM   Specimen: BLOOD  Result Value Ref Range Status   Specimen Description BLOOD BLOOD RIGHT HAND  Final   Special Requests AEROBIC BOTTLE ONLY Blood Culture adequate volume  Final   Culture   Final    NO GROWTH 5 DAYS Performed at [facility], [place] [address] [place], [place] [address]    Report Status 10/14/2021 FINAL  Final  Urine Culture     Status: None   Collection Time: 10/15/21  7:45 AM   Specimen: Urine, Catheterized  Result Value Ref Range Status   Specimen Description URINE, CATHETERIZED  Final   Special Requests NONE  Final   Culture   Final    NO GROWTH Performed at [place] [address], [place], [place] [address]    Report Status 10/16/2021 FINAL  Final    Labs: CBC: Recent Labs  Lab 10/10/21 0103 10/11/21 0245 10/13/21 0339 10/14/21 1429 10/16/21 0831  WBC 11.4* 12.1* 14.2* 15.1* 11.5*  NEUTROABS 8.0* 8.9* 10.3* 12.0* 8.8*  HGB 9.9* 10.7* 11.1* 11.5* 11.2*  HCT 30.7* 33.4* 34.5* 34.4* 33.6*  MCV 93.3 93.0 93.2 91.0 90.3  PLT 451* 445* 448* 377 972   Basic Metabolic Panel: Recent Labs  Lab 10/10/21 0103 10/11/21 0245 10/13/21 0339 10/14/21 0837  NA 140 144 140 139  K 3.4* 3.7 3.9 3.7  CL 103 108 102 105  CO2 _0 GLUCOSE 138* 158* 209* 307*  BUN 20 23* 31* 24*  CREATININE 0.86 0.92 1.38* 0.94  CALCIUM 8.8* 9.0 9.1 8.5*   Liver Function Tests: No results for input(s): AST, ALT, ALKPHOS,  BILITOT, PROT, ALBUMIN in the last 168 hours. CBG: Recent Labs  Lab 10/15/21 1529 10/15/21 2018 10/15/21 2321 10/16/21 0351 10/16/21 0826  GLUCAP 145* 230* 191* 210* 187*    Discharge time spent: greater than 30 minutes.  Signed: Erin Hearing, NP [facility] 10/16/2021

## 2021-10-17 DIAGNOSIS — J690 Pneumonitis due to inhalation of food and vomit: Secondary | ICD-10-CM

## 2021-10-17 DIAGNOSIS — J9601 Acute respiratory failure with hypoxia: Secondary | ICD-10-CM

## 2021-10-17 DIAGNOSIS — I619 Nontraumatic intracerebral hemorrhage, unspecified: Secondary | ICD-10-CM | POA: Diagnosis not present

## 2021-10-17 DIAGNOSIS — J398 Other specified diseases of upper respiratory tract: Secondary | ICD-10-CM

## 2021-10-17 DIAGNOSIS — I480 Paroxysmal atrial fibrillation: Secondary | ICD-10-CM | POA: Diagnosis not present

## 2021-10-17 LAB — CBC WITH DIFFERENTIAL/PLATELET
Abs Immature Granulocytes: 0.05 10*3/uL (ref 0.00–0.07)
Basophils Absolute: 0 10*3/uL (ref 0.0–0.1)
Basophils Relative: 0 %
Eosinophils Absolute: 0.4 10*3/uL (ref 0.0–0.5)
Eosinophils Relative: 4 %
HCT: 31.8 % — ABNORMAL LOW (ref 39.0–52.0)
Hemoglobin: 10.8 g/dL — ABNORMAL LOW (ref 13.0–17.0)
Immature Granulocytes: 1 %
Lymphocytes Relative: 9 %
Lymphs Abs: 1 10*3/uL (ref 0.7–4.0)
MCH: 30.1 pg (ref 26.0–34.0)
MCHC: 34 g/dL (ref 30.0–36.0)
MCV: 88.6 fL (ref 80.0–100.0)
Monocytes Absolute: 1.1 10*3/uL — ABNORMAL HIGH (ref 0.1–1.0)
Monocytes Relative: 10 %
Neutro Abs: 8.1 10*3/uL — ABNORMAL HIGH (ref 1.7–7.7)
Neutrophils Relative %: 76 %
Platelets: 298 10*3/uL (ref 150–400)
RBC: 3.59 MIL/uL — ABNORMAL LOW (ref 4.22–5.81)
RDW: 12.5 % (ref 11.5–15.5)
WBC: 10.7 10*3/uL — ABNORMAL HIGH (ref 4.0–10.5)
nRBC: 0 % (ref 0.0–0.2)

## 2021-10-17 LAB — URINE CULTURE: Culture: NO GROWTH

## 2021-10-17 LAB — PROTIME-INR
INR: 1 (ref 0.8–1.2)
Prothrombin Time: 13.3 seconds (ref 11.4–15.2)

## 2021-10-17 LAB — PHOSPHORUS: Phosphorus: 3 mg/dL (ref 2.5–4.6)

## 2021-10-17 LAB — COMPREHENSIVE METABOLIC PANEL
ALT: 17 U/L (ref 0–44)
AST: 11 U/L — ABNORMAL LOW (ref 15–41)
Albumin: 2.6 g/dL — ABNORMAL LOW (ref 3.5–5.0)
Alkaline Phosphatase: 99 U/L (ref 38–126)
Anion gap: 10 (ref 5–15)
BUN: 17 mg/dL (ref 6–20)
CO2: 26 mmol/L (ref 22–32)
Calcium: 8.8 mg/dL — ABNORMAL LOW (ref 8.9–10.3)
Chloride: 101 mmol/L (ref 98–111)
Creatinine, Ser: 0.75 mg/dL (ref 0.61–1.24)
GFR, Estimated: >60 mL/min (ref >60)
Glucose, Bld: 256 mg/dL — ABNORMAL HIGH (ref 70–99)
Potassium: 3 mmol/L — ABNORMAL LOW (ref 3.5–5.1)
Sodium: 137 mmol/L (ref 135–145)
Total Bilirubin: 0.2 mg/dL — ABNORMAL LOW (ref 0.3–1.2)
Total Protein: 5.8 g/dL — ABNORMAL LOW (ref 6.5–8.1)

## 2021-10-17 LAB — TSH: TSH: 1.767 u[IU]/mL (ref 0.350–4.500)

## 2021-10-17 LAB — HEMOGLOBIN A1C
Hgb A1c MFr Bld: 8.1 % — ABNORMAL HIGH (ref 4.8–5.6)
Mean Plasma Glucose: 185.77 mg/dL

## 2021-10-17 LAB — MAGNESIUM: Magnesium: 2.1 mg/dL (ref 1.7–2.4)

## 2021-10-17 NOTE — Consult Note (Signed)
Pulmonary Critical Care Medicine [place]  PULMONARY SERVICE  Date of Service: 10/17/2021  PULMONARY CRITICAL CARE CONSULT   Caleb Vaughan  [id number]  DOB: April 16, 1961   DOA: 10/16/2021  Referring Physician: Satira Sark, MD  HPI: Caleb Vaughan is a 61 y.o. male seen for follow up of Acute on Chronic Respiratory Failure.  Patient has multiple medical problems including diabetes obstructive sleep apnea deconditioning atrial fibrillation who came into the hospital because of a code stroke.  Patient had left-sided weakness dysarthria gaze was fixed CT showed a acute hemorrhage in the right thalamic area and intraventricular extension.  The patient was seen by neurology intubated and placed in the ICU.  Subsequently patient was not able to wean off the ventilator had to have a PEG tube and a tracheostomy done transferred now to our facility for further management and weaning  Review of Systems:  ROS performed and is unremarkable other than noted above.  No past medical history on file.  Past Surgical History:  Procedure Laterality Date   ESOPHAGOGASTRODUODENOSCOPY N/A 10/02/2021   Procedure: ESOPHAGOGASTRODUODENOSCOPY (EGD);  Surgeon: Jesusita Oka, MD;  Location: [facility] ENDOSCOPY;  Service: General;  Laterality: N/A;   IR [place] PERCUT W/FLUORO  10/12/2021   PEG PLACEMENT N/A 10/02/2021   Procedure: PERCUTANEOUS ENDOSCOPIC GASTROSTOMY (PEG) PLACEMENT;  Surgeon: Jesusita Oka, MD;  Location: [facility] ENDOSCOPY;  Service: General;  Laterality: N/A;    Social History:    reports that he has never smoked. He has never used smokeless tobacco. No history on file for alcohol use and drug use.  Family History: Non-Contributory to the present illness  Allergies  Allergen Reactions   Apixaban Anaphylaxis    Other reaction(s): Myalgias (intolerance)   Iodinated Contrast Media Hives   Shellfish Allergy Itching and Hives    Throat feels a little  scratchy    Medications: Reviewed on Rounds  Physical Exam:  Vitals: Temperature 98.1 pulse 88 respiratory 25 blood pressure 164/73 saturations 96%  Ventilator Settings off the ventilator on T-piece on 28% FiO2  General: Comfortable at this time Eyes: Grossly normal lids, irises & conjunctiva ENT: grossly tongue is normal Neck: no obvious mass Cardiovascular: S1-S2 normal no gallop or rub Respiratory: Scattered rhonchi expansion is equal Abdomen: Soft and nontender Skin: no rash seen on limited exam Musculoskeletal: not rigid Psychiatric:unable to assess Neurologic: no seizure no involuntary movements         Labs on Admission:  Basic Metabolic Panel: Recent Labs  Lab 10/11/21 0245 10/13/21 0339 10/14/21 0837 10/17/21 0317  NA 144 140 139 137  K 3.7 3.9 3.7 3.0*  CL 108 102 105 101  CO2 27 25 25 26   GLUCOSE 158* 209* 307* 256*  BUN 23* 31* 24* 17  CREATININE 0.92 1.38* 0.94 0.75  CALCIUM 9.0 9.1 8.5* 8.8*  MG  --   --   --  2.1  PHOS  --   --   --  3.0    No results for input(s): PHART, PCO2ART, PO2ART, HCO3, O2SAT in the last 168 hours.  Liver Function Tests: Recent Labs  Lab 10/17/21 0317  AST 11*  ALT 17  ALKPHOS 99  BILITOT 0.2*  PROT 5.8*  ALBUMIN 2.6*   No results for input(s): LIPASE, AMYLASE in the last 168 hours. No results for input(s): AMMONIA in the last 168 hours.  CBC: Recent Labs  Lab 10/11/21 0245 10/13/21 0339 10/14/21 1429 10/16/21 0831 10/17/21 0317  WBC 12.1* 14.2* 15.1*  11.5* 10.7*  NEUTROABS 8.9* 10.3* 12.0* 8.8* 8.1*  HGB 10.7* 11.1* 11.5* 11.2* 10.8*  HCT 33.4* 34.5* 34.4* 33.6* 31.8*  MCV 93.0 93.2 91.0 90.3 88.6  PLT 445* 448* 377 309 298    Cardiac Enzymes: No results for input(s): CKTOTAL, CKMB, CKMBINDEX, TROPONINI in the last 168 hours.  BNP (last 3 results) No results for input(s): BNP in the last 8760 hours.  ProBNP (last 3 results) No results for input(s): PROBNP in the last 8760  hours.   Radiological Exams on Admission: DG Abd 1 View  Result Date: 10/16/2021 CLINICAL DATA:  Peg tube check. EXAM: ABDOMEN - 1 VIEW COMPARISON:  09/21/2021 FINDINGS: Contrast material opacifies the gastric lumen, duodenum and proximal jejunum. No evidence for free contrast in the peritoneal cavity. IMPRESSION: Presumably injected contrast through the PEG tube opacifies the gastric lumen and proximal small bowel. No evidence for contrast extravasation to suggest leak. Electronically Signed   By: Misty Stanley M.D.   On: 10/16/2021 16:36   DG Chest Port 1 View  Result Date: 10/16/2021 CLINICAL DATA:  Pneumonia EXAM: PORTABLE CHEST 1 VIEW COMPARISON:  10/09/2021 FINDINGS: Tracheostomy appropriately positioned. Numerous leads and wires project over the chest. Midline trachea. Borderline cardiomegaly. No pleural effusion or pneumothorax. Favor subsegmental atelectasis at both lung bases. No developing lobar consolidation. IMPRESSION: Borderline cardiomegaly with similar bibasilar atelectasis. No acute findings. Electronically Signed   By: Abigail Miyamoto M.D.   On: 10/16/2021 16:34   DG Swallowing Func-Speech Pathology  Result Date: 10/13/2021 Table formatting from the original result was not included. Objective Swallowing Evaluation: Type of Study: MBS-Modified Barium Swallow Study  Patient Details Name: Caleb Vaughan MRN: [id number] Date of Birth: 10/30/1960 Today's Date: 10/13/2021 Time: SLP Start Time (ACUTE ONLY): 1310 -SLP Stop Time (ACUTE ONLY): 1325 SLP Time Calculation (min) (ACUTE ONLY): 15 min Past Medical History: No past medical history on file. Past Surgical History: Past Surgical History: Procedure Laterality Date  ESOPHAGOGASTRODUODENOSCOPY N/A 10/02/2021  Procedure: ESOPHAGOGASTRODUODENOSCOPY (EGD);  Surgeon: Jesusita Oka, MD;  Location: [facility] ENDOSCOPY;  Service: General;  Laterality: N/A;  IR [place] PERCUT W/FLUORO  10/12/2021  PEG PLACEMENT N/A 10/02/2021  Procedure:  PERCUTANEOUS ENDOSCOPIC GASTROSTOMY (PEG) PLACEMENT;  Surgeon: Jesusita Oka, MD;  Location: [facility] ENDOSCOPY;  Service: General;  Laterality: N/A; HPI: Pt is a 61 y/o male who presented with left sided weakness, dysarthria, and right fixed gaze. Imaging revealed R thalamic hemorrhage, edema and trace leftward midline shift. Pt intubated in ED for increasing somnolence and concerns regarding airway protection. ETT 1/22-1/29 failed extubation due to secretions; reintubated 1/29-trach 1/31. PMH: A-fib on coumadin, HTN, HLD, previous smoker, prior CVA, DM, OSA.  Subjective: awake, alert, difficulty following commands, distracted  Recommendations for follow up therapy are one component of a multi-disciplinary discharge planning process, led by the attending physician.  Recommendations may be updated based on patient status, additional functional criteria and insurance authorization. Assessment / Plan / Recommendation Clinical Impressions 10/13/2021 Clinical Impression Patient presents with a severe oropharyngeal dysphagia with suspected significant impact from cognitive impairment. Patient appeared distracted and did not consistently follow commands. SLP performed oral care via suction and removed mild amount of secretions. PMV was briefly donned but patient did not appear to tolerate and it was quickly removed. Patient exhibited a mod-severe oral and severe pharyngeal dysphagia. During oral phase, anterior to posterior transit was delayed of nectar thick liquid via spoon sip and puree solid via spoon. In addition, patient had oral residuals on  tongue post initial swallow. During pharyngeal phase, swallow was initiated at level of vallecular sinus, however patient exhibited delays of at least 90 seconds before swallow was initiated. Only trace vallecular residuals observed with nectar thick liquids and mild residuals with puree solids. No penetration or aspiration. SLP is recommending continue NPO status and continue with  PO trials of nectar thick liquids via spoon and puree solids via spoon with SLP only. SLP Visit Diagnosis Dysphagia, oropharyngeal phase (R13.12) Attention and concentration deficit following -- Frontal lobe and executive function deficit following -- Impact on safety and function Severe aspiration risk;Risk for inadequate nutrition/hydration   Treatment Recommendations 10/13/2021 Treatment Recommendations Therapy as outlined in treatment plan below   Prognosis 10/13/2021 Prognosis for Safe Diet Advancement Good Barriers to Reach Goals Severity of deficits Barriers/Prognosis Comment -- Diet Recommendations 10/13/2021 SLP Diet Recommendations NPO;Other (Comment) Liquid Administration via -- Medication Administration -- Compensations -- Postural Changes --   Other Recommendations 10/13/2021 Recommended Consults -- Oral Care Recommendations Oral care QID;Staff/trained caregiver to provide oral care Other Recommendations -- Follow Up Recommendations Skilled nursing-short term rehab (<3 hours/day) Assistance recommended at discharge Frequent or constant Supervision/Assistance Functional Status Assessment Patient has had a recent decline in their functional status and demonstrates the ability to make significant improvements in function in a reasonable and predictable amount of time. Frequency and Duration  10/13/2021 Speech Therapy Frequency (ACUTE ONLY) min 2x/week Treatment Duration 2 weeks   Oral Phase 10/13/2021 Oral Phase -- Oral - Pudding Teaspoon -- Oral - Pudding Cup -- Oral - Honey Teaspoon -- Oral - Honey Cup -- Oral - Nectar Teaspoon -- Oral - Nectar Cup -- Oral - Nectar Straw -- Oral - Thin Teaspoon NT Oral - Thin Cup -- Oral - Thin Straw -- Oral - Puree Weak lingual manipulation;Reduced posterior propulsion;Delayed oral transit;Premature spillage Oral - Mech Soft -- Oral - Regular -- Oral - Multi-Consistency -- Oral - Pill -- Oral Phase - Comment --  Pharyngeal Phase 10/13/2021 Pharyngeal Phase Impaired Pharyngeal-  Pudding Teaspoon -- Pharyngeal -- Pharyngeal- Pudding Cup -- Pharyngeal -- Pharyngeal- Honey Teaspoon -- Pharyngeal -- Pharyngeal- Honey Cup -- Pharyngeal -- Pharyngeal- Nectar Teaspoon Delayed swallow initiation-vallecula;Pharyngeal residue - valleculae Pharyngeal -- Pharyngeal- Nectar Cup -- Pharyngeal -- Pharyngeal- Nectar Straw -- Pharyngeal -- Pharyngeal- Thin Teaspoon -- Pharyngeal -- Pharyngeal- Thin Cup -- Pharyngeal -- Pharyngeal- Thin Straw -- Pharyngeal -- Pharyngeal- Puree Delayed swallow initiation-vallecula;Pharyngeal residue - valleculae Pharyngeal -- Pharyngeal- Mechanical Soft -- Pharyngeal -- Pharyngeal- Regular -- Pharyngeal -- Pharyngeal- Multi-consistency -- Pharyngeal -- Pharyngeal- Pill -- Pharyngeal -- Pharyngeal Comment --  Cervical Esophageal Phase  10/13/2021 Cervical Esophageal Phase WFL Pudding Teaspoon -- Pudding Cup -- Honey Teaspoon -- Honey Cup -- Nectar Teaspoon -- Nectar Cup -- Nectar Straw -- Thin Teaspoon -- Thin Cup -- Thin Straw -- Puree -- Mechanical Soft -- Regular -- Multi-consistency -- Pill -- Cervical Esophageal Comment -- Sonia Baller, MA, CCC-SLP Speech Therapy                      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  Secretions are going to be limiting factor we will continue with aggressive pulmonary toilet and supportive care.  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  I have personally seen and evaluated the patient, evaluated laboratory and imaging results, formulated the assessment and plan and placed orders. The Patient requires high complexity decision making with multiple systems involvement.  Case was discussed on Rounds with the Respiratory Therapy Director and the Respiratory staff Time Spent 58minutes  Jeromey Kruer A Dois Juarbe, MD [facility] Pulmonary Critical Care Medicine Sleep Medicine

## 2021-10-18 LAB — CBC
HCT: 32.8 % — ABNORMAL LOW (ref 39.0–52.0)
Hemoglobin: 11.1 g/dL — ABNORMAL LOW (ref 13.0–17.0)
MCH: 30.3 pg (ref 26.0–34.0)
MCHC: 33.8 g/dL (ref 30.0–36.0)
MCV: 89.6 fL (ref 80.0–100.0)
Platelets: 265 10*3/uL (ref 150–400)
RBC: 3.66 MIL/uL — ABNORMAL LOW (ref 4.22–5.81)
RDW: 12.6 % (ref 11.5–15.5)
WBC: 10.8 10*3/uL — ABNORMAL HIGH (ref 4.0–10.5)
nRBC: 0 % (ref 0.0–0.2)

## 2021-10-18 LAB — CULTURE, RESPIRATORY W GRAM STAIN: Culture: NORMAL

## 2021-10-18 LAB — BASIC METABOLIC PANEL
Anion gap: 12 (ref 5–15)
BUN: 24 mg/dL — ABNORMAL HIGH (ref 6–20)
CO2: 25 mmol/L (ref 22–32)
Calcium: 8.9 mg/dL (ref 8.9–10.3)
Chloride: 100 mmol/L (ref 98–111)
Creatinine, Ser: 1.41 mg/dL — ABNORMAL HIGH (ref 0.61–1.24)
GFR, Estimated: 57 mL/min — ABNORMAL LOW (ref >60)
Glucose, Bld: 344 mg/dL — ABNORMAL HIGH (ref 70–99)
Potassium: 3.7 mmol/L (ref 3.5–5.1)
Sodium: 137 mmol/L (ref 135–145)

## 2021-10-18 LAB — MAGNESIUM: Magnesium: 2.4 mg/dL (ref 1.7–2.4)

## 2021-10-18 LAB — PHOSPHORUS: Phosphorus: 4.2 mg/dL (ref 2.5–4.6)

## 2021-10-19 DIAGNOSIS — J9601 Acute respiratory failure with hypoxia: Secondary | ICD-10-CM

## 2021-10-19 DIAGNOSIS — J398 Other specified diseases of upper respiratory tract: Secondary | ICD-10-CM

## 2021-10-19 DIAGNOSIS — I619 Nontraumatic intracerebral hemorrhage, unspecified: Secondary | ICD-10-CM

## 2021-10-19 DIAGNOSIS — I480 Paroxysmal atrial fibrillation: Secondary | ICD-10-CM

## 2021-10-19 DIAGNOSIS — J690 Pneumonitis due to inhalation of food and vomit: Secondary | ICD-10-CM

## 2021-10-19 LAB — RENAL FUNCTION PANEL
Albumin: 2.5 g/dL — ABNORMAL LOW (ref 3.5–5.0)
Anion gap: 10 (ref 5–15)
BUN: 25 mg/dL — ABNORMAL HIGH (ref 6–20)
CO2: 27 mmol/L (ref 22–32)
Calcium: 9 mg/dL (ref 8.9–10.3)
Chloride: 103 mmol/L (ref 98–111)
Creatinine, Ser: 1.13 mg/dL (ref 0.61–1.24)
GFR, Estimated: >60 mL/min (ref >60)
Glucose, Bld: 224 mg/dL — ABNORMAL HIGH (ref 70–99)
Phosphorus: 4.7 mg/dL — ABNORMAL HIGH (ref 2.5–4.6)
Potassium: 3.6 mmol/L (ref 3.5–5.1)
Sodium: 140 mmol/L (ref 135–145)

## 2021-10-19 LAB — MAGNESIUM: Magnesium: 2.5 mg/dL — ABNORMAL HIGH (ref 1.7–2.4)

## 2021-10-19 NOTE — Progress Notes (Signed)
Pulmonary Critical Care Medicine [facility] [place]   PULMONARY CRITICAL CARE SERVICE  PROGRESS NOTE     Caleb Vaughan  [id number]  DOB: [birth year]   DOA: 10/16/2021  Referring Physician: Luna Kitchens, MD  HPI: Caleb Vaughan is a 61 y.o. male being followed for ventilator/airway/oxygen weaning Acute on Chronic Respiratory Failure.  Patient is on T-piece has been comfortable without distress using the PMV  Medications: Reviewed on Rounds  Physical Exam:  Vitals: Temperature is 97.9 pulse 71 respiratory rate 16 blood pressure is 118/59 saturations 95%  Ventilator Settings on T-piece FiO2 28%  General: Comfortable at this time Neck: supple Cardiovascular: no malignant arrhythmias Respiratory: Scattered rhonchi expansion is equal Skin: no rash seen on limited exam Musculoskeletal: No gross abnormality Psychiatric:unable to assess Neurologic:no involuntary movements         Lab Data:   Basic Metabolic Panel: Recent Labs  Lab 10/13/21 0339 10/14/21 0837 10/17/21 0317 10/18/21 0631 10/19/21 0310  NA 140 139 137 137 140  K 3.9 3.7 3.0* 3.7 3.6  CL 102 105 101 100 103  CO2 25 25 26 25 27   GLUCOSE 209* 307* 256* 344* 224*  BUN 31* 24* 17 24* 25*  CREATININE 1.38* 0.94 0.75 1.41* 1.13  CALCIUM 9.1 8.5* 8.8* 8.9 9.0  MG  --   --  2.1 2.4 2.5*  PHOS  --   --  3.0 4.2 4.7*    ABG: No results for input(s): PHART, PCO2ART, PO2ART, HCO3, O2SAT in the last 168 hours.  Liver Function Tests: Recent Labs  Lab 10/17/21 0317 10/19/21 0310  AST 11*  --   ALT 17  --   ALKPHOS 99  --   BILITOT 0.2*  --   PROT 5.8*  --   ALBUMIN 2.6* 2.5*   No results for input(s): LIPASE, AMYLASE in the last 168 hours. No results for input(s): AMMONIA in the last 168 hours.  CBC: Recent Labs  Lab 10/13/21 0339 10/14/21 1429 10/16/21 0831 10/17/21 0317 10/18/21 0631  WBC 14.2* 15.1* 11.5* 10.7* 10.8*  NEUTROABS 10.3* 12.0* 8.8* 8.1*  --   HGB 11.1*  11.5* 11.2* 10.8* 11.1*  HCT 34.5* 34.4* 33.6* 31.8* 32.8*  MCV 93.2 91.0 90.3 88.6 89.6  PLT 448* 377 309 298 265    Cardiac Enzymes: No results for input(s): CKTOTAL, CKMB, CKMBINDEX, TROPONINI in the last 168 hours.  BNP (last 3 results) No results for input(s): BNP in the last 8760 hours.  ProBNP (last 3 results) No results for input(s): PROBNP in the last 8760 hours.  Radiological Exams: No results found.  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   Aspiration pneumonia (HCC)   Paroxysmal A-fib (HCC)   Tracheal stenosis   Acute respiratory failure with hypoxia (HCC)   Acute on chronic respiratory failure hypoxia plan is to continue with the T-piece on 28% FiO2 using the PMV Aspiration pneumonia has been treated with antibiotics we will continue to follow along Paroxysmal atrial fibrillation rate is controlled Tracheal stenosis no change we will continue to follow closely Nontraumatic intracranial hemorrhage supportive care patient's mental status remains the same   I have personally seen and evaluated the patient, evaluated laboratory and imaging results, formulated the assessment and plan and placed orders. The Patient requires high complexity decision making with multiple systems involvement.  Rounds were done with the Respiratory Therapy Director and Staff therapists and discussed with nursing staff also.  10/20/21, MD  [facility] Pulmonary Critical Care Medicine Sleep Medicine

## 2021-10-20 NOTE — Progress Notes (Signed)
Pulmonary Critical Care Medicine [place]   PULMONARY CRITICAL CARE SERVICE  PROGRESS NOTE     Caleb Vaughan  [id number]  DOB: 02-14-1961   DOA: 10/16/2021  Referring Physician: Satira Sark, MD  HPI: Caleb Vaughan is a 61 y.o. male being followed for ventilator/airway/oxygen weaning Acute on Chronic Respiratory Failure.  Patient is making good progress and is on T collar with the PMV secretions are mild to moderate requiring suctioning 3 times yesterday  Medications: Reviewed on Rounds  Physical Exam:  Vitals: Temperature is 97.5 pulse 97 respiratory is 24 blood pressure is 164 rate 84 saturations 95%  Ventilator Settings off the ventilator on T collar FiO2 40%  General: Comfortable at this time Neck: supple Cardiovascular: no malignant arrhythmias Respiratory: Scattered rhonchi very coarse breath sounds Skin: no rash seen on limited exam Musculoskeletal: No gross abnormality Psychiatric:unable to assess Neurologic:no involuntary movements         Lab Data:   Basic Metabolic Panel: Recent Labs  Lab 10/14/21 0837 10/17/21 0317 10/18/21 0631 10/19/21 0310  NA 139 137 137 140  K 3.7 3.0* 3.7 3.6  CL 105 101 100 103  CO2 25 26 25 27   GLUCOSE 307* 256* 344* 224*  BUN 24* 17 24* 25*  CREATININE 0.94 0.75 1.41* 1.13  CALCIUM 8.5* 8.8* 8.9 9.0  MG  --  2.1 2.4 2.5*  PHOS  --  3.0 4.2 4.7*    ABG: No results for input(s): PHART, PCO2ART, PO2ART, HCO3, O2SAT in the last 168 hours.  Liver Function Tests: Recent Labs  Lab 10/17/21 0317 10/19/21 0310  AST 11*  --   ALT 17  --   ALKPHOS 99  --   BILITOT 0.2*  --   PROT 5.8*  --   ALBUMIN 2.6* 2.5*   No results for input(s): LIPASE, AMYLASE in the last 168 hours. No results for input(s): AMMONIA in the last 168 hours.  CBC: Recent Labs  Lab 10/14/21 1429 10/16/21 0831 10/17/21 0317 10/18/21 0631  WBC 15.1* 11.5* 10.7* 10.8*  NEUTROABS 12.0* 8.8* 8.1*  --   HGB  11.5* 11.2* 10.8* 11.1*  HCT 34.4* 33.6* 31.8* 32.8*  MCV 91.0 90.3 88.6 89.6  PLT 377 309 298 265    Cardiac Enzymes: No results for input(s): CKTOTAL, CKMB, CKMBINDEX, TROPONINI in the last 168 hours.  BNP (last 3 results) No results for input(s): BNP in the last 8760 hours.  ProBNP (last 3 results) No results for input(s): PROBNP in the last 8760 hours.  Radiological Exams: No results found.  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   I have personally seen and evaluated the patient, evaluated laboratory and imaging results, formulated the assessment and plan and placed orders. The Patient requires high complexity decision making with multiple systems involvement.  Rounds were done with the Respiratory Therapy Director and Staff therapists and discussed with nursing staff also.  Allyne Gee, MD [facility] Pulmonary Critical Care Medicine Sleep Medicine

## 2021-10-21 LAB — RENAL FUNCTION PANEL
Albumin: 2.8 g/dL — ABNORMAL LOW (ref 3.5–5.0)
Anion gap: 11 (ref 5–15)
BUN: 18 mg/dL (ref 6–20)
CO2: 31 mmol/L (ref 22–32)
Calcium: 9.4 mg/dL (ref 8.9–10.3)
Chloride: 99 mmol/L (ref 98–111)
Creatinine, Ser: 0.86 mg/dL (ref 0.61–1.24)
GFR, Estimated: >60 mL/min (ref >60)
Glucose, Bld: 200 mg/dL — ABNORMAL HIGH (ref 70–99)
Phosphorus: 3.1 mg/dL (ref 2.5–4.6)
Potassium: 3.6 mmol/L (ref 3.5–5.1)
Sodium: 141 mmol/L (ref 135–145)

## 2021-10-21 LAB — CBC
HCT: 34.3 % — ABNORMAL LOW (ref 39.0–52.0)
Hemoglobin: 11.6 g/dL — ABNORMAL LOW (ref 13.0–17.0)
MCH: 30.5 pg (ref 26.0–34.0)
MCHC: 33.8 g/dL (ref 30.0–36.0)
MCV: 90.3 fL (ref 80.0–100.0)
Platelets: 262 10*3/uL (ref 150–400)
RBC: 3.8 MIL/uL — ABNORMAL LOW (ref 4.22–5.81)
RDW: 12.7 % (ref 11.5–15.5)
WBC: 7.7 10*3/uL (ref 4.0–10.5)
nRBC: 0 % (ref 0.0–0.2)

## 2021-10-21 LAB — MAGNESIUM: Magnesium: 2.4 mg/dL (ref 1.7–2.4)

## 2021-10-21 NOTE — Progress Notes (Signed)
Pulmonary Critical Care Medicine [facility] [place]   PULMONARY CRITICAL CARE SERVICE  PROGRESS NOTE     Caleb Vaughan  [id number]  DOB: [birth year]   DOA: 10/16/2021  Referring Physician: Luna Kitchens, MD  HPI: Caleb Vaughan is a 61 y.o. male being followed for ventilator/airway/oxygen weaning Acute on Chronic Respiratory Failure.  Patient is off the ventilator on T-piece has been doing fine with 28% FiO2 using the PMV  Medications: Reviewed on Rounds  Physical Exam:  Vitals: Temperature is 97.4 pulse 82 respiratory is 18 blood pressure is 163/78 saturations 97%  Ventilator Settings off the ventilator on T-piece with PMV  General: Comfortable at this time Neck: supple Cardiovascular: no malignant arrhythmias Respiratory: Scattered coarse rhonchi expansion equal Skin: no rash seen on limited exam Musculoskeletal: No gross abnormality Psychiatric:unable to assess Neurologic:no involuntary movements         Lab Data:   Basic Metabolic Panel: Recent Labs  Lab 10/17/21 0317 10/18/21 0631 10/19/21 0310 10/21/21 0405  NA 137 137 140 141  K 3.0* 3.7 3.6 3.6  CL 101 100 103 99  CO2 26 25 27 31   GLUCOSE 256* 344* 224* 200*  BUN 17 24* 25* 18  CREATININE 0.75 1.41* 1.13 0.86  CALCIUM 8.8* 8.9 9.0 9.4  MG 2.1 2.4 2.5* 2.4  PHOS 3.0 4.2 4.7* 3.1    ABG: No results for input(s): PHART, PCO2ART, PO2ART, HCO3, O2SAT in the last 168 hours.  Liver Function Tests: Recent Labs  Lab 10/17/21 0317 10/19/21 0310 10/21/21 0405  AST 11*  --   --   ALT 17  --   --   ALKPHOS 99  --   --   BILITOT 0.2*  --   --   PROT 5.8*  --   --   ALBUMIN 2.6* 2.5* 2.8*   No results for input(s): LIPASE, AMYLASE in the last 168 hours. No results for input(s): AMMONIA in the last 168 hours.  CBC: Recent Labs  Lab 10/14/21 1429 10/16/21 0831 10/17/21 0317 10/18/21 0631 10/21/21 0405  WBC 15.1* 11.5* 10.7* 10.8* 7.7  NEUTROABS 12.0* 8.8* 8.1*  --   --    HGB 11.5* 11.2* 10.8* 11.1* 11.6*  HCT 34.4* 33.6* 31.8* 32.8* 34.3*  MCV 91.0 90.3 88.6 89.6 90.3  PLT 377 309 298 265 262    Cardiac Enzymes: No results for input(s): CKTOTAL, CKMB, CKMBINDEX, TROPONINI in the last 168 hours.  BNP (last 3 results) No results for input(s): BNP in the last 8760 hours.  ProBNP (last 3 results) No results for input(s): PROBNP in the last 8760 hours.  Radiological Exams: No results found.  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 Nontraumatic intracranial hemorrhage involving the thalamic area supportive care Paroxysmal atrial fibrillation rate is controlled at this time Tracheal stenosis no change we will continue to follow along closely Aspiration pneumonia patient remains at risk for aspiration   I have personally seen and evaluated the patient, evaluated laboratory and imaging results, formulated the assessment and plan and placed orders. The Patient requires high complexity decision making with multiple systems involvement.  Rounds were done with the Respiratory Therapy Director and Staff therapists and discussed with nursing  staff also.  Allyne Gee, MD [facility] Pulmonary Critical Care Medicine Sleep Medicine

## 2021-10-22 NOTE — Progress Notes (Signed)
Pulmonary Critical Care Medicine [name]   PULMONARY CRITICAL CARE SERVICE  PROGRESS NOTE     Caleb Vaughan  JYN:[id number]  DOB: [birth year]   DOA: 10/16/2021  Referring Physician: Luna Kitchens, MD  HPI: Caleb Vaughan is a 61 y.o. male being followed for ventilator/airway/oxygen weaning Acute on Chronic Respiratory Failure.  Patient is on T collar this morning was attempted on capping yesterday but did not tolerate  Medications: Reviewed on Rounds  Physical Exam:  Vitals: Temperature is 97.3 pulse 68 respiratory is 30 blood pressure is 143/72 saturations 97%  Ventilator Settings off the ventilator on T collar FiO2 28%  General: Comfortable at this time Neck: supple Cardiovascular: no malignant arrhythmias Respiratory: No rhonchi no rales Skin: no rash seen on limited exam Musculoskeletal: No gross abnormality Psychiatric:unable to assess Neurologic:no involuntary movements         Lab Data:   Basic Metabolic Panel: Recent Labs  Lab 10/17/21 0317 10/18/21 0631 10/19/21 0310 10/21/21 0405  NA 137 137 140 141  K 3.0* 3.7 3.6 3.6  CL 101 100 103 99  CO2 26 25 27 31   GLUCOSE 256* 344* 224* 200*  BUN 17 24* 25* 18  CREATININE 0.75 1.41* 1.13 0.86  CALCIUM 8.8* 8.9 9.0 9.4  MG 2.1 2.4 2.5* 2.4  PHOS 3.0 4.2 4.7* 3.1    ABG: No results for input(s): PHART, PCO2ART, PO2ART, HCO3, O2SAT in the last 168 hours.  Liver Function Tests: Recent Labs  Lab 10/17/21 0317 10/19/21 0310 10/21/21 0405  AST 11*  --   --   ALT 17  --   --   ALKPHOS 99  --   --   BILITOT 0.2*  --   --   PROT 5.8*  --   --   ALBUMIN 2.6* 2.5* 2.8*   No results for input(s): LIPASE, AMYLASE in the last 168 hours. No results for input(s): AMMONIA in the last 168 hours.  CBC: Recent Labs  Lab 10/16/21 0831 10/17/21 0317 10/18/21 0631 10/21/21 0405  WBC 11.5* 10.7* 10.8* 7.7  NEUTROABS 8.8* 8.1*  --   --   HGB 11.2* 10.8* 11.1* 11.6*  HCT 33.6*  31.8* 32.8* 34.3*  MCV 90.3 88.6 89.6 90.3  PLT 309 298 265 262    Cardiac Enzymes: No results for input(s): CKTOTAL, CKMB, CKMBINDEX, TROPONINI in the last 168 hours.  BNP (last 3 results) No results for input(s): BNP in the last 8760 hours.  ProBNP (last 3 results) No results for input(s): PROBNP in the last 8760 hours.  Radiological Exams: No results found.  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   Aspiration pneumonia (HCC)   Paroxysmal A-fib (HCC)   Tracheal stenosis   Acute respiratory failure with hypoxia (HCC)   Acute on chronic respiratory failure hypoxia patient was attempted on capping did not do well yesterday respiratory therapy will reassess again today and try to do capping trials Aspiration pneumonia patient does remain at risk for recurring aspiration events continue with aspiration precautions Paroxysmal atrial fibrillation rate is controlled Tracheal stenosis noted on chart by history perhaps could be a reason for failing to do capping will need to continue to monitor closely Nontraumatic thalamic hemorrhage patient remains encephalopathic we will continue to monitor   I have personally seen and evaluated the patient, evaluated laboratory and imaging results, formulated the assessment and plan and placed orders. The Patient requires high complexity decision making with multiple systems  involvement.  Rounds were done with the Respiratory Therapy Director and Staff therapists and discussed with nursing staff also.  Caleb Gee, MD [facility] Pulmonary Critical Care Medicine Sleep Medicine

## 2021-10-23 ENCOUNTER — Other Ambulatory Visit (HOSPITAL_COMMUNITY): Payer: Self-pay

## 2021-10-23 HISTORY — PX: IR REPLACE G-TUBE SIMPLE WO FLUORO: IMG2323

## 2021-10-23 MED ORDER — DIATRIZOATE MEGLUMINE & SODIUM 66-10 % PO SOLN
ORAL | Status: AC
  Administered 2021-10-23: 30 mL via GASTROSTOMY
  Filled 2021-10-23: qty 30

## 2021-10-23 NOTE — Progress Notes (Signed)
Pulmonary Critical Care Medicine [place]   PULMONARY CRITICAL CARE SERVICE  PROGRESS NOTE     Sjon Sergio  [id number]  DOB: Jun 27, 1961   DOA: 10/16/2021  Referring Physician: Satira Sark, MD  HPI: Lemual Platts is a 61 y.o. male being followed for ventilator/airway/oxygen weaning Acute on Chronic Respiratory Failure.  Patient was attempted on capping yesterday and again had some issues with stridor developing.  He does fine with the PMV  Medications: Reviewed on Rounds  Physical Exam:  Vitals: Temperature is 98.3 pulse 93 respiratory 27 blood pressure is 189/95 saturations 98%  Ventilator Settings on T collar with PMV  General: Comfortable at this time Neck: supple Cardiovascular: no malignant arrhythmias Respiratory: Scattered coarse rhonchi Skin: no rash seen on limited exam Musculoskeletal: No gross abnormality Psychiatric:unable to assess Neurologic:no involuntary movements         Lab Data:   Basic Metabolic Panel: Recent Labs  Lab 10/17/21 0317 10/18/21 0631 10/19/21 0310 10/21/21 0405  NA 137 137 140 141  K 3.0* 3.7 3.6 3.6  CL 101 100 103 99  CO2 26 25 27 31   GLUCOSE 256* 344* 224* 200*  BUN 17 24* 25* 18  CREATININE 0.75 1.41* 1.13 0.86  CALCIUM 8.8* 8.9 9.0 9.4  MG 2.1 2.4 2.5* 2.4  PHOS 3.0 4.2 4.7* 3.1    ABG: No results for input(s): PHART, PCO2ART, PO2ART, HCO3, O2SAT in the last 168 hours.  Liver Function Tests: Recent Labs  Lab 10/17/21 0317 10/19/21 0310 10/21/21 0405  AST 11*  --   --   ALT 17  --   --   ALKPHOS 99  --   --   BILITOT 0.2*  --   --   PROT 5.8*  --   --   ALBUMIN 2.6* 2.5* 2.8*   No results for input(s): LIPASE, AMYLASE in the last 168 hours. No results for input(s): AMMONIA in the last 168 hours.  CBC: Recent Labs  Lab 10/17/21 0317 10/18/21 0631 10/21/21 0405  WBC 10.7* 10.8* 7.7  NEUTROABS 8.1*  --   --   HGB 10.8* 11.1* 11.6*  HCT 31.8* 32.8* 34.3*  MCV 88.6  89.6 90.3  PLT 298 265 262    Cardiac Enzymes: No results for input(s): CKTOTAL, CKMB, CKMBINDEX, TROPONINI in the last 168 hours.  BNP (last 3 results) No results for input(s): BNP in the last 8760 hours.  ProBNP (last 3 results) No results for input(s): PROBNP in the last 8760 hours.  Radiological Exams: No results found.  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   Aspiration pneumonia (HCC)   Paroxysmal A-fib (HCC)   Tracheal stenosis   Acute respiratory failure with hypoxia (HCC)   Acute on chronic respiratory failure hypoxia plan is to change the trach out downsized to a cuffless trach and then see about trying to do capping trials again Aspiration pneumonia has been treated with antibiotics x-ray results reviewed Paroxysmal atrial fibrillation rate controlled supportive care Tracheal stenosis we will try to downsize the trach and see if this makes any difference as far as the ability to do capping trials Nontraumatic thalamic stroke PT OT as tolerated   I have personally seen and evaluated the patient, evaluated laboratory and imaging results, formulated the assessment and plan and placed orders. The Patient requires high complexity decision making with multiple systems involvement.  Rounds were done with the Respiratory Therapy Director and Staff therapists and discussed  with nursing staff also.  Allyne Gee, MD [facility] Pulmonary Critical Care Medicine Sleep Medicine

## 2021-10-23 NOTE — Procedures (Signed)
Pre procedural Dx: Dysphagia, poorly functioning feeding tube. Post procedural Dx: Same  Successful bedside guided replacement of 18 Fr Entuit gastrostomy tube.   KUB w/contrast ordered to confirm location prior to use, RN aware to hold until resulted.  EBL: None  Complications: None immediate.  Lynnette Caffey, PA-C

## 2021-10-24 NOTE — Progress Notes (Signed)
Pulmonary Critical Care Medicine [place]   PULMONARY CRITICAL CARE SERVICE  PROGRESS NOTE     Caleb Vaughan  [id number]  DOB: November 10, 1960   DOA: 10/16/2021  Referring Physician: Satira Sark, MD  HPI: Caleb Vaughan is a 61 y.o. male being followed for ventilator/airway/oxygen weaning Acute on Chronic Respiratory Failure.  Patient completed 8 hours Yesterday, he was found to have increased work of breathing and tachycardia yesterday evening, subsequently placed on trach collar 28% overnight.  We will cap him today as tolerated  Medications: Reviewed on Rounds  Physical Exam:  Vitals: Temperature 98.1, pulse 67, respirations 17, BP 124/67, pulse ox 98%  Ventilator Settings trach collar 28%  General: Comfortable at this time Neck: supple Cardiovascular: no malignant arrhythmias Respiratory: Bilateral expiratory coarse sounds Skin: no rash seen on limited exam Musculoskeletal: No gross abnormality Psychiatric:unable to assess Neurologic:no involuntary movements         Lab Data:   Basic Metabolic Panel: Recent Labs  Lab 10/18/21 0631 10/19/21 0310 10/21/21 0405  NA 137 140 141  K 3.7 3.6 3.6  CL 100 103 99  CO2 25 27 31   GLUCOSE 344* 224* 200*  BUN 24* 25* 18  CREATININE 1.41* 1.13 0.86  CALCIUM 8.9 9.0 9.4  MG 2.4 2.5* 2.4  PHOS 4.2 4.7* 3.1    ABG: No results for input(s): PHART, PCO2ART, PO2ART, HCO3, O2SAT in the last 168 hours.  Liver Function Tests: Recent Labs  Lab 10/19/21 0310 10/21/21 0405  ALBUMIN 2.5* 2.8*   No results for input(s): LIPASE, AMYLASE in the last 168 hours. No results for input(s): AMMONIA in the last 168 hours.  CBC: Recent Labs  Lab 10/18/21 0631 10/21/21 0405  WBC 10.8* 7.7  HGB 11.1* 11.6*  HCT 32.8* 34.3*  MCV 89.6 90.3  PLT 265 262    Cardiac Enzymes: No results for input(s): CKTOTAL, CKMB, CKMBINDEX, TROPONINI in the last 168 hours.  BNP (last 3 results) No results for  input(s): BNP in the last 8760 hours.  ProBNP (last 3 results) No results for input(s): PROBNP in the last 8760 hours.  Radiological Exams: IR REPLACE G-TUBE SIMPLE WO FLUORO  Result Date: 10/23/2021 INDICATION: Patient with dislodged 18 Fr Entuit balloon retention gastrostomy tube, Foley catheter currently in tract. Request for replacement. EXAM: BEDSIDE REPLACEMENT OF GASTROSTOMY TUBE COMPARISON:  None. MEDICATIONS: None. CONTRAST:  30 cc Gastrografin - administered into the gastric lumen during KUB FLUOROSCOPY TIME:  None COMPLICATIONS: None immediate. PROCEDURE: The existing Foley catheter retention balloon was deflated and removed in tact from the gastrostomy tract. A new 18-French balloon inflatable gastrostomy tube was placed without the existing tract with immediate return of gastric contents. The balloon was inflated with 7 cc of normal saline and pulled against the anterior inner lumen of the stomach and the external disc was cinched. 30 cc gastrografin injected during KUB which confirms appropriate position within the gastric lumen. The patient tolerated the procedure well without immediate postprocedural complication. IMPRESSION: Successful bedside replacement of a new 18-French gastrostomy tube. The gastrostomy tube is ready for immediate use. Read by Candiss Norse, PA-C Electronically Signed   By: Aletta Edouard M.D.   On: 10/23/2021 11:53   DG ABDOMEN PEG TUBE LOCATION  Result Date: 10/23/2021 CLINICAL DATA:  Peg tube placement EXAM: ABDOMEN - 1 VIEW COMPARISON:  10/16/2021 FINDINGS: 30 cc of Gastrografin was injected via the PEG tube. Contrast opacifies gastric lumen. No contrast extravasation. Numerous air-filled loops of bowel throughout the abdomen.  IMPRESSION: PEG tube is within the stomach. Electronically Signed   By: Lavonia Dana M.D.   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    Aspiration pneumonia (HCC)   Paroxysmal A-fib (HCC)   Tracheal stenosis   Acute respiratory failure with hypoxia (HCC)   Acute on chronic respiratory failure with hypoxia, patient completed 8 hours Yesterday, was placed on trach collar 28% overnight for increased work of breathing, will continue capping today as tolerated Aspiration pneumonia has been treated with antibiotics Proxysmal A-fib, rate controlled supportive care Tracheal stenosis, patient trach was downsized yesterday, was able to do 8 hours Yesterday increased work of breathing noticed yesterday evening, will continue with capping trials today as tolerated Nontraumatic thalamic stroke, PT OT as tolerated   I have personally seen and evaluated the patient, evaluated laboratory and imaging results, formulated the assessment and plan and placed orders. The Patient requires high complexity decision making with multiple systems involvement.  Rounds were done with the Respiratory Therapy Director and Staff therapists and discussed with nursing staff also.  Allyne Gee, MD [facility] Pulmonary Critical Care Medicine Sleep Medicine

## 2021-10-25 ENCOUNTER — Other Ambulatory Visit (HOSPITAL_COMMUNITY): Payer: Self-pay

## 2021-10-25 LAB — CBC
HCT: 34.6 % — ABNORMAL LOW (ref 39.0–52.0)
Hemoglobin: 11.2 g/dL — ABNORMAL LOW (ref 13.0–17.0)
MCH: 30 pg (ref 26.0–34.0)
MCHC: 32.4 g/dL (ref 30.0–36.0)
MCV: 92.8 fL (ref 80.0–100.0)
Platelets: 289 10*3/uL (ref 150–400)
RBC: 3.73 MIL/uL — ABNORMAL LOW (ref 4.22–5.81)
RDW: 12.9 % (ref 11.5–15.5)
WBC: 12.1 10*3/uL — ABNORMAL HIGH (ref 4.0–10.5)
nRBC: 0 % (ref 0.0–0.2)

## 2021-10-25 LAB — RENAL FUNCTION PANEL
Albumin: 3 g/dL — ABNORMAL LOW (ref 3.5–5.0)
Anion gap: 10 (ref 5–15)
BUN: 59 mg/dL — ABNORMAL HIGH (ref 6–20)
CO2: 32 mmol/L (ref 22–32)
Calcium: 9.3 mg/dL (ref 8.9–10.3)
Chloride: 102 mmol/L (ref 98–111)
Creatinine, Ser: 1.96 mg/dL — ABNORMAL HIGH (ref 0.61–1.24)
GFR, Estimated: 39 mL/min — ABNORMAL LOW (ref >60)
Glucose, Bld: 239 mg/dL — ABNORMAL HIGH (ref 70–99)
Phosphorus: 5.2 mg/dL — ABNORMAL HIGH (ref 2.5–4.6)
Potassium: 3.7 mmol/L (ref 3.5–5.1)
Sodium: 144 mmol/L (ref 135–145)

## 2021-10-25 LAB — URINALYSIS, ROUTINE W REFLEX MICROSCOPIC
Bilirubin Urine: NEGATIVE
Glucose, UA: 50 mg/dL — AB
Hgb urine dipstick: NEGATIVE
Ketones, ur: NEGATIVE mg/dL
Leukocytes,Ua: NEGATIVE
Nitrite: NEGATIVE
Protein, ur: 100 mg/dL — AB
RBC / HPF: >50 RBC/hpf — ABNORMAL HIGH (ref 0–5)
Specific Gravity, Urine: 1.016 (ref 1.005–1.030)
pH: 5 (ref 5.0–8.0)

## 2021-10-25 LAB — MAGNESIUM: Magnesium: 3 mg/dL — ABNORMAL HIGH (ref 1.7–2.4)

## 2021-10-25 NOTE — Progress Notes (Signed)
Pulmonary Critical Care Medicine [place]   PULMONARY CRITICAL CARE SERVICE  PROGRESS NOTE     Caleb Vaughan  [id number]  DOB: May 07, 1961   DOA: 10/16/2021  Referring Physician: Satira Sark, MD  HPI: Unk Caleb Vaughan is a 61 y.o. male being followed for ventilator/airway/oxygen weaning Acute on Chronic Respiratory Failure.  Patient currently capped with 1 L nasal cannula in place.  Continues to have weak cough, required lavage x1 last night.  Noon today will be 24 hours.  Medications: Reviewed on Rounds  Physical Exam:  Vitals: Temperature 97.7, pulse 83, respirations 15, blood pressure 135/88, 96% pulse ox  Ventilator Settings capped 1 L nasal cannula  General: Comfortable at this time Neck: supple Cardiovascular: no malignant arrhythmias Respiratory: Bilaterally expiratory coarse, bilateral inspiratory clear Skin: no rash seen on limited exam Musculoskeletal: No gross abnormality Psychiatric:unable to assess Neurologic:no involuntary movements         Lab Data:   Basic Metabolic Panel: Recent Labs  Lab 10/19/21 0310 10/21/21 0405  NA 140 141  K 3.6 3.6  CL 103 99  CO2 27 31  GLUCOSE 224* 200*  BUN 25* 18  CREATININE 1.13 0.86  CALCIUM 9.0 9.4  MG 2.5* 2.4  PHOS 4.7* 3.1    ABG: No results for input(s): PHART, PCO2ART, PO2ART, HCO3, O2SAT in the last 168 hours.  Liver Function Tests: Recent Labs  Lab 10/19/21 0310 10/21/21 0405  ALBUMIN 2.5* 2.8*   No results for input(s): LIPASE, AMYLASE in the last 168 hours. No results for input(s): AMMONIA in the last 168 hours.  CBC: Recent Labs  Lab 10/21/21 0405  WBC 7.7  HGB 11.6*  HCT 34.3*  MCV 90.3  PLT 262    Cardiac Enzymes: No results for input(s): CKTOTAL, CKMB, CKMBINDEX, TROPONINI in the last 168 hours.  BNP (last 3 results) No results for input(s): BNP in the last 8760 hours.  ProBNP (last 3 results) No results for input(s): PROBNP in the last 8760  hours.  Radiological Exams: IR REPLACE G-TUBE SIMPLE WO FLUORO  Result Date: 10/23/2021 INDICATION: Patient with dislodged 18 Fr Entuit balloon retention gastrostomy tube, Foley catheter currently in tract. Request for replacement. EXAM: BEDSIDE REPLACEMENT OF GASTROSTOMY TUBE COMPARISON:  None. MEDICATIONS: None. CONTRAST:  30 cc Gastrografin - administered into the gastric lumen during KUB FLUOROSCOPY TIME:  None COMPLICATIONS: None immediate. PROCEDURE: The existing Foley catheter retention balloon was deflated and removed in tact from the gastrostomy tract. A new 18-French balloon inflatable gastrostomy tube was placed without the existing tract with immediate return of gastric contents. The balloon was inflated with 7 cc of normal saline and pulled against the anterior inner lumen of the stomach and the external disc was cinched. 30 cc gastrografin injected during KUB which confirms appropriate position within the gastric lumen. The patient tolerated the procedure well without immediate postprocedural complication. IMPRESSION: Successful bedside replacement of a new 18-French gastrostomy tube. The gastrostomy tube is ready for immediate use. Read by Candiss Norse, PA-C Electronically Signed   By: Aletta Edouard M.D.   On: 10/23/2021 11:53   DG ABDOMEN PEG TUBE LOCATION  Result Date: 10/23/2021 CLINICAL DATA:  Peg tube placement EXAM: ABDOMEN - 1 VIEW COMPARISON:  10/16/2021 FINDINGS: 30 cc of Gastrografin was injected via the PEG tube. Contrast opacifies gastric lumen. No contrast extravasation. Numerous air-filled loops of bowel throughout the abdomen. IMPRESSION: PEG tube is within the stomach. Electronically Signed   By: Lavonia Dana M.D.   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   Aspiration pneumonia (HCC)   Paroxysmal A-fib (HCC)   Tracheal stenosis   Acute respiratory failure with hypoxia  (HCC)   Acute on chronic respiratory failure with hypoxia, patient Currently capped with 1 L nasal cannula in place, noon today will be 24-hour.,  Continue capping as tolerated Aspiration pneumonia, has been treated with antibiotic Paroxysmal A-fib, rate controlled Tracheal stenosis, trachs was downsized 2 days ago, patient has been capped almost 24 hours, secretions still requiring respiratory intervention, continue capping trials as tolerated Nontraumatic thalamic stroke, PT OT as tolerated   I have personally seen and evaluated the patient, evaluated laboratory and imaging results, formulated the assessment and plan and placed orders. The Patient requires high complexity decision making with multiple systems involvement.  Rounds were done with the Respiratory Therapy Director and Staff therapists and discussed with nursing staff also.  Allyne Gee, MD [facility] Pulmonary Critical Care Medicine Sleep Medicine

## 2021-10-26 LAB — RENAL FUNCTION PANEL
Albumin: 2.8 g/dL — ABNORMAL LOW (ref 3.5–5.0)
Anion gap: 11 (ref 5–15)
BUN: 53 mg/dL — ABNORMAL HIGH (ref 6–20)
CO2: 32 mmol/L (ref 22–32)
Calcium: 9.2 mg/dL (ref 8.9–10.3)
Chloride: 106 mmol/L (ref 98–111)
Creatinine, Ser: 1.53 mg/dL — ABNORMAL HIGH (ref 0.61–1.24)
GFR, Estimated: 52 mL/min — ABNORMAL LOW (ref >60)
Glucose, Bld: 158 mg/dL — ABNORMAL HIGH (ref 70–99)
Phosphorus: 4.3 mg/dL (ref 2.5–4.6)
Potassium: 3.7 mmol/L (ref 3.5–5.1)
Sodium: 149 mmol/L — ABNORMAL HIGH (ref 135–145)

## 2021-10-26 LAB — POTASSIUM: Potassium: 3.3 mmol/L — ABNORMAL LOW (ref 3.5–5.1)

## 2021-10-26 LAB — URINE CULTURE: Culture: NO GROWTH

## 2021-10-26 NOTE — Progress Notes (Signed)
Pulmonary Critical Care Medicine [place]   PULMONARY CRITICAL CARE SERVICE  PROGRESS NOTE     Caleb Vaughan  [id number]  DOB: 08/23/1961   DOA: 10/16/2021  Referring Physician: Satira Sark, MD  HPI: Caleb Vaughan is a 61 y.o. male being followed for ventilator/airway/oxygen weaning Acute on Chronic Respiratory Failure.  Patient not tolerating capping trials yesterday.  Further review shows history of OSA and uses CPAP at night.  Will continue capping trials today as tolerated and request CPAP from family  Medications: Reviewed on Rounds  Physical Exam:  Vitals: Temp 98.0, pulse 66, respirations 12, BP 124/68, SPO2 90%  Ventilator Settings ATC 28%  General: Comfortable at this time Neck: supple Cardiovascular: no malignant arrhythmias Respiratory: Bilaterally clear on inspiration bilaterally coarse on expiration Skin: no rash seen on limited exam Musculoskeletal: No gross abnormality Psychiatric:unable to assess Neurologic:no involuntary movements         Lab Data:   Basic Metabolic Panel: Recent Labs  Lab 10/21/21 0405 10/25/21 1200 10/26/21 0649  NA 141 144 149*  K 3.6 3.7 3.7  CL 99 102 106  CO2 31 32 32  GLUCOSE 200* 239* 158*  BUN 18 59* 53*  CREATININE 0.86 1.96* 1.53*  CALCIUM 9.4 9.3 9.2  MG 2.4 3.0*  --   PHOS 3.1 5.2* 4.3    ABG: No results for input(s): PHART, PCO2ART, PO2ART, HCO3, O2SAT in the last 168 hours.  Liver Function Tests: Recent Labs  Lab 10/21/21 0405 10/25/21 1200 10/26/21 0649  ALBUMIN 2.8* 3.0* 2.8*   No results for input(s): LIPASE, AMYLASE in the last 168 hours. No results for input(s): AMMONIA in the last 168 hours.  CBC: Recent Labs  Lab 10/21/21 0405 10/25/21 1200  WBC 7.7 12.1*  HGB 11.6* 11.2*  HCT 34.3* 34.6*  MCV 90.3 92.8  PLT 262 289    Cardiac Enzymes: No results for input(s): CKTOTAL, CKMB, CKMBINDEX, TROPONINI in the last 168 hours.  BNP (last 3 results) No  results for input(s): BNP in the last 8760 hours.  ProBNP (last 3 results) No results for input(s): PROBNP in the last 8760 hours.  Radiological Exams: US RENAL  Result Date: 10/25/2021 CLINICAL DATA:  Renal dysfunction EXAM: RENAL / URINARY TRACT ULTRASOUND COMPLETE COMPARISON:  None. FINDINGS: Right Kidney: Renal measurements: 13 x 5.1 x 5.6 cm 1 = volume: 195.5 ML. There is no hydronephrosis. Cortical echogenicity is unremarkable. Left Kidney: Renal measurements: 12.2 x 5 x 5.1 cm = volume: 163.2 mL. There is no hydronephrosis. There is no perinephric fluid collection. There is no focal cortical thinning. Bladder: Foley catheter is seen in the bladder. Bladder is empty and not adequately visualized for evaluation. Other: None. IMPRESSION: There is no hydronephrosis. Electronically Signed   By: Elmer Picker M.D.   On: 10/25/2021 16:21   DG Chest Port 1 View  Result Date: 10/25/2021 CLINICAL DATA:  Shortness of breath, elevated WBC count EXAM: PORTABLE CHEST 1 VIEW COMPARISON:  10/16/2021 FINDINGS: Transverse diameter of heart is increased. Central pulmonary vessels are more prominent. There is poor inspiration. There is prominence of interstitial markings in the parahilar regions. Costophrenic angles are clear. There is no pneumothorax. Tip of tracheostomy is proximally 3.3 cm above the carina. IMPRESSION: Cardiomegaly. Central pulmonary vessels are more prominent, possibly suggesting mild CHF. Part of this finding may be due to poor inspiration in the current study. There is prominence of interstitial markings in the parahilar regions, possibly suggesting mild interstitial pulmonary edema. Electronically  Signed   By: Elmer Picker M.D.   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  Spoke with family request they bring his CPAP Aspiration pneumonia, has been treated with antibiotics Possible A-fib, currently rate controlled Tracheal stenosis, trach was downsized 3 days ago, capping trials have been initiated.  Patient was desatting, it was found that patient uses CPAP at home.  We will continue capping trials as tolerated.  CPAP requested from family Nontraumatic thalamic stroke, PT OT as tolerated   I have personally seen and evaluated the patient, evaluated laboratory and imaging results, formulated the assessment and plan and placed orders. The Patient requires high complexity decision making with multiple systems involvement.  Rounds were done with the Respiratory Therapy Director and Staff therapists and discussed with nursing staff also.  Allyne Gee, MD [facility] Pulmonary Critical Care Medicine Sleep Medicine

## 2021-10-27 LAB — CBC
HCT: 31.9 % — ABNORMAL LOW (ref 39.0–52.0)
Hemoglobin: 10.4 g/dL — ABNORMAL LOW (ref 13.0–17.0)
MCH: 30.6 pg (ref 26.0–34.0)
MCHC: 32.6 g/dL (ref 30.0–36.0)
MCV: 93.8 fL (ref 80.0–100.0)
Platelets: 267 10*3/uL (ref 150–400)
RBC: 3.4 MIL/uL — ABNORMAL LOW (ref 4.22–5.81)
RDW: 13 % (ref 11.5–15.5)
WBC: 9.9 10*3/uL (ref 4.0–10.5)
nRBC: 0 % (ref 0.0–0.2)

## 2021-10-27 LAB — RENAL FUNCTION PANEL
Albumin: 2.7 g/dL — ABNORMAL LOW (ref 3.5–5.0)
Anion gap: 13 (ref 5–15)
BUN: 37 mg/dL — ABNORMAL HIGH (ref 6–20)
CO2: 30 mmol/L (ref 22–32)
Calcium: 8.9 mg/dL (ref 8.9–10.3)
Chloride: 103 mmol/L (ref 98–111)
Creatinine, Ser: 1.25 mg/dL — ABNORMAL HIGH (ref 0.61–1.24)
GFR, Estimated: >60 mL/min (ref >60)
Glucose, Bld: 177 mg/dL — ABNORMAL HIGH (ref 70–99)
Phosphorus: 3.9 mg/dL (ref 2.5–4.6)
Potassium: 3.5 mmol/L (ref 3.5–5.1)
Sodium: 146 mmol/L — ABNORMAL HIGH (ref 135–145)

## 2021-10-27 LAB — MAGNESIUM: Magnesium: 2.6 mg/dL — ABNORMAL HIGH (ref 1.7–2.4)

## 2021-10-27 LAB — CULTURE, RESPIRATORY W GRAM STAIN
Culture: NO GROWTH
Gram Stain: NONE SEEN

## 2021-10-27 NOTE — Progress Notes (Signed)
Pulmonary Critical Care Medicine [place]   PULMONARY CRITICAL CARE SERVICE  PROGRESS NOTE     Caleb Vaughan  [id number]  DOB: 1961-03-17   DOA: 10/16/2021  Referring Physician: Satira Sark, MD  HPI: Caleb Vaughan is a 61 y.o. male being followed for ventilator/airway/oxygen weaning Acute on Chronic Respiratory Failure.  Patient is on T-piece has been on 28% FiO2 was attempted at capping and has been failing  Medications: Reviewed on Rounds  Physical Exam:  Vitals: Temperature is 97.3 pulse 64 respiratory is 27 blood pressure 127/69 saturations 97%  Ventilator Settings on T-piece FiO2 28%  General: Comfortable at this time Neck: supple Cardiovascular: no malignant arrhythmias Respiratory: Coarse rhonchi are noted bilaterally Skin: no rash seen on limited exam Musculoskeletal: No gross abnormality Psychiatric:unable to assess Neurologic:no involuntary movements         Lab Data:   Basic Metabolic Panel: Recent Labs  Lab 10/21/21 0405 10/25/21 1200 10/26/21 0649 10/26/21 1918 10/27/21 0411  NA 141 144 149*  --  146*  K 3.6 3.7 3.7 3.3* 3.5  CL 99 102 106  --  103  CO2 31 32 32  --  30  GLUCOSE 200* 239* 158*  --  177*  BUN 18 59* 53*  --  37*  CREATININE 0.86 1.96* 1.53*  --  1.25*  CALCIUM 9.4 9.3 9.2  --  8.9  MG 2.4 3.0*  --   --  2.6*  PHOS 3.1 5.2* 4.3  --  3.9    ABG: No results for input(s): PHART, PCO2ART, PO2ART, HCO3, O2SAT in the last 168 hours.  Liver Function Tests: Recent Labs  Lab 10/21/21 0405 10/25/21 1200 10/26/21 0649 10/27/21 0411  ALBUMIN 2.8* 3.0* 2.8* 2.7*   No results for input(s): LIPASE, AMYLASE in the last 168 hours. No results for input(s): AMMONIA in the last 168 hours.  CBC: Recent Labs  Lab 10/21/21 0405 10/25/21 1200 10/27/21 0411  WBC 7.7 12.1* 9.9  HGB 11.6* 11.2* 10.4*  HCT 34.3* 34.6* 31.9*  MCV 90.3 92.8 93.8  PLT 262 289 267    Cardiac Enzymes: No results for  input(s): CKTOTAL, CKMB, CKMBINDEX, TROPONINI in the last 168 hours.  BNP (last 3 results) No results for input(s): BNP in the last 8760 hours.  ProBNP (last 3 results) No results for input(s): PROBNP in the last 8760 hours.  Radiological Exams: US RENAL  Result Date: 10/25/2021 CLINICAL DATA:  Renal dysfunction EXAM: RENAL / URINARY TRACT ULTRASOUND COMPLETE COMPARISON:  None. FINDINGS: Right Kidney: Renal measurements: 13 x 5.1 x 5.6 cm 1 = volume: 195.5 ML. There is no hydronephrosis. Cortical echogenicity is unremarkable. Left Kidney: Renal measurements: 12.2 x 5 x 5.1 cm = volume: 163.2 mL. There is no hydronephrosis. There is no perinephric fluid collection. There is no focal cortical thinning. Bladder: Foley catheter is seen in the bladder. Bladder is empty and not adequately visualized for evaluation. Other: None. IMPRESSION: There is no hydronephrosis. Electronically Signed   By: Elmer Picker M.D.   On: 10/25/2021 16:21   DG Chest Port 1 View  Result Date: 10/25/2021 CLINICAL DATA:  Shortness of breath, elevated WBC count EXAM: PORTABLE CHEST 1 VIEW COMPARISON:  10/16/2021 FINDINGS: Transverse diameter of heart is increased. Central pulmonary vessels are more prominent. There is poor inspiration. There is prominence of interstitial markings in the parahilar regions. Costophrenic angles are clear. There is no pneumothorax. Tip of tracheostomy is proximally 3.3 cm above the carina. IMPRESSION: Cardiomegaly. Central  pulmonary vessels are more prominent, possibly suggesting mild CHF. Part of this finding may be due to poor inspiration in the current study. There is prominence of interstitial markings in the parahilar regions, possibly suggesting mild interstitial pulmonary edema. Electronically Signed   By: Elmer Picker M.D.   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 Paroxysmal atrial fibrillation rate is controlled at this time Tracheal stenosis overall no change Nontraumatic thalamic stroke prognosis guarded   I have personally seen and evaluated the patient, evaluated laboratory and imaging results, formulated the assessment and plan and placed orders. The Patient requires high complexity decision making with multiple systems involvement.  Rounds were done with the Respiratory Therapy Director and Staff therapists and discussed with nursing staff also.  Allyne Gee, MD [facility] Pulmonary Critical Care Medicine Sleep Medicine

## 2021-10-28 ENCOUNTER — Other Ambulatory Visit (HOSPITAL_COMMUNITY): Payer: Self-pay

## 2021-10-28 LAB — CBC
HCT: 34.4 % — ABNORMAL LOW (ref 39.0–52.0)
Hemoglobin: 11.3 g/dL — ABNORMAL LOW (ref 13.0–17.0)
MCH: 30.2 pg (ref 26.0–34.0)
MCHC: 32.8 g/dL (ref 30.0–36.0)
MCV: 92 fL (ref 80.0–100.0)
Platelets: 285 10*3/uL (ref 150–400)
RBC: 3.74 MIL/uL — ABNORMAL LOW (ref 4.22–5.81)
RDW: 12.7 % (ref 11.5–15.5)
WBC: 8.9 10*3/uL (ref 4.0–10.5)
nRBC: 0 % (ref 0.0–0.2)

## 2021-10-28 LAB — RENAL FUNCTION PANEL
Albumin: 2.9 g/dL — ABNORMAL LOW (ref 3.5–5.0)
Anion gap: 11 (ref 5–15)
BUN: 24 mg/dL — ABNORMAL HIGH (ref 6–20)
CO2: 31 mmol/L (ref 22–32)
Calcium: 9.4 mg/dL (ref 8.9–10.3)
Chloride: 100 mmol/L (ref 98–111)
Creatinine, Ser: 1.1 mg/dL (ref 0.61–1.24)
GFR, Estimated: >60 mL/min (ref >60)
Glucose, Bld: 221 mg/dL — ABNORMAL HIGH (ref 70–99)
Phosphorus: 3.9 mg/dL (ref 2.5–4.6)
Potassium: 3.9 mmol/L (ref 3.5–5.1)
Sodium: 142 mmol/L (ref 135–145)

## 2021-10-28 LAB — MAGNESIUM: Magnesium: 2.6 mg/dL — ABNORMAL HIGH (ref 1.7–2.4)

## 2021-10-28 NOTE — Progress Notes (Signed)
Pulmonary Critical Care Medicine ?Caleb Vaughan ?  ?PULMONARY CRITICAL CARE SERVICE ? ?PROGRESS NOTE ? ? ? ? ?Caleb Vaughan  ?[id number]: 09-Jan-1961  ? ?DOA: 10/16/2021 ? ?Referring Physician: Satira Sark, MD ? ?HPI: Caleb Vaughan is a 61 y.o. male being followed for ventilator/airway/oxygen weaning Acute on Chronic Respiratory Failure.  Patient is on T collar has not been tolerating capping suggested changing the trach to a #4 trach to see if this makes any difference ? ?Medications: ?Reviewed on Rounds ? ?Physical Exam: ? ?Vitals: Temperature is 96.4 pulse 69 respiratory is 14 blood pressure is 119/63 saturations 97% ? ?Ventilator Settings on T-piece ? ?General: Comfortable at this time ?Neck: supple ?Cardiovascular: no malignant arrhythmias ?Respiratory: Scattered rhonchi very coarse breath sounds ?Skin: no rash seen on limited exam ?Musculoskeletal: No gross abnormality ?Psychiatric:unable to assess ?Neurologic:no involuntary movements   ?   ?   ?Lab Data:  ? ?Basic Metabolic Panel: ?Recent Labs  ?Lab 10/25/21 ?1200 10/26/21 ?[id number] 10/26/21 ?1918 10/27/21 ?0411 10/28/21 ?0533  ?NA 144 149*  --  146* 142  ?K 3.7 3.7 3.3* 3.5 3.9  ?CL 102 106  --  103 100  ?CO2 32 32  --  30 31  ?GLUCOSE 239* 158*  --  177* 221*  ?BUN 59* 53*  --  37* 24*  ?CREATININE 1.96* 1.53*  --  1.25* 1.10  ?CALCIUM 9.3 9.2  --  8.9 9.4  ?MG 3.0*  --   --  2.6* 2.6*  ?PHOS 5.2* 4.3  --  3.9 3.9  ? ? ?ABG: ?No results for input(s): PHART, PCO2ART, PO2ART, HCO3, O2SAT in the last 168 hours. ? ?Liver Function Tests: ?Recent Labs  ?Lab 10/25/21 ?1200 10/26/21 ?[id number] 10/27/21 ?0411 10/28/21 ?0533  ?ALBUMIN 3.0* 2.8* 2.7* 2.9*  ? ?No results for input(s): LIPASE, AMYLASE in the last 168 hours. ?No results for input(s): AMMONIA in the last 168 hours. ? ?CBC: ?Recent Labs  ?Lab 10/25/21 ?1200 10/27/21 ?0411 10/28/21 ?0533  ?WBC 12.1* 9.9 8.9  ?HGB 11.2* 10.4* 11.3*  ?HCT 34.6* 31.9* 34.4*  ?MCV 92.8 93.8 92.0  ?PLT 289  267 285  ? ? ?Cardiac Enzymes: ?No results for input(s): CKTOTAL, CKMB, CKMBINDEX, TROPONINI in the last 168 hours. ? ?BNP (last 3 results) ?No results for input(s): BNP in the last 8760 hours. ? ?ProBNP (last 3 results) ?No results for input(s): PROBNP in the last 8760 hours. ? ?Radiological Exams: ?No results found. ? ?Assessment/Plan ?Active Problems: ?  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  Aspiration pneumonia ([place]) ?  Paroxysmal A-fib ([place]) ?  Tracheal stenosis ?  Acute respiratory failure with hypoxia ([place]) ? ? ?Acute on chronic respiratory failure with hypoxia we will proceed to downsize the trach to a #4 and then try capping once again to see if patient is able to tolerate. ?Aspiration pneumonia treated we will continue to follow along closely. ?Paroxysmal atrial fibrillation rate is controlled at this time ?Tracheal stenosis possibly may have an easier time trying capping with a smaller trach we will see how the patient does ?Nontraumatic thalamic stroke supportive care ? ? ?I have personally seen and evaluated the patient, evaluated laboratory and imaging results, formulated the assessment and plan and placed orders. ?The Patient requires high complexity decision making with multiple systems involvement.  ?Rounds were done with the Respiratory Therapy Director and Staff therapists and discussed with nursing staff also. ? ?Allyne Gee, MD FCCP ?Pulmonary Critical Care Medicine ?Sleep  Medicine ? ?

## 2021-10-29 NOTE — Progress Notes (Signed)
Pulmonary Critical Care Medicine ?Caleb Vaughan ?  ?PULMONARY CRITICAL CARE SERVICE ? ?PROGRESS NOTE ? ? ? ? ?Caleb Vaughan  ?[id number]: [birth year]  ? ?DOA: 10/16/2021 ? ?Referring Physician: Satira Sark, MD ? ?HPI: Caleb Vaughan is a 61 y.o. male being followed for ventilator/airway/oxygen weaning Acute on Chronic Respiratory Failure.  Patient was attempted on weaning with the capping once again but did not tolerate ? ?Medications: ?Reviewed on Rounds ? ?Physical Exam: ? ?Vitals: Temperature is 98 pulse 74 respiratory 30 blood pressure is 141/60 saturations 98% ? ?Ventilator Settings capping will be attempted again currently is on T collar FiO2 of 28% ? ?General: Comfortable at this time ?Neck: supple ?Cardiovascular: no malignant arrhythmias ?Respiratory: Scattered rhonchi expansion is equal ?Skin: no rash seen on limited exam ?Musculoskeletal: No gross abnormality ?Psychiatric:unable to assess ?Neurologic:no involuntary movements   ?   ?   ?Lab Data:  ? ?Basic Metabolic Panel: ?Recent Labs  ?Lab 10/25/21 ?1200 10/26/21 ?[id number] 10/26/21 ?1918 10/27/21 ?0411 10/28/21 ?0533  ?NA 144 149*  --  146* 142  ?K 3.7 3.7 3.3* 3.5 3.9  ?CL 102 106  --  103 100  ?CO2 32 32  --  30 31  ?GLUCOSE 239* 158*  --  177* 221*  ?BUN 59* 53*  --  37* 24*  ?CREATININE 1.96* 1.53*  --  1.25* 1.10  ?CALCIUM 9.3 9.2  --  8.9 9.4  ?MG 3.0*  --   --  2.6* 2.6*  ?PHOS 5.2* 4.3  --  3.9 3.9  ? ? ?ABG: ?No results for input(s): PHART, PCO2ART, PO2ART, HCO3, O2SAT in the last 168 hours. ? ?Liver Function Tests: ?Recent Labs  ?Lab 10/25/21 ?1200 10/26/21 ?[id number] 10/27/21 ?0411 10/28/21 ?0533  ?ALBUMIN 3.0* 2.8* 2.7* 2.9*  ? ?No results for input(s): LIPASE, AMYLASE in the last 168 hours. ?No results for input(s): AMMONIA in the last 168 hours. ? ?CBC: ?Recent Labs  ?Lab 10/25/21 ?1200 10/27/21 ?0411 10/28/21 ?0533  ?WBC 12.1* 9.9 8.9  ?HGB 11.2* 10.4* 11.3*  ?HCT 34.6* 31.9* 34.4*  ?MCV 92.8 93.8 92.0  ?PLT 289 267  285  ? ? ?Cardiac Enzymes: ?No results for input(s): CKTOTAL, CKMB, CKMBINDEX, TROPONINI in the last 168 hours. ? ?BNP (last 3 results) ?No results for input(s): BNP in the last 8760 hours. ? ?ProBNP (last 3 results) ?No results for input(s): PROBNP in the last 8760 hours. ? ?Radiological Exams: ?No results found. ? ?Assessment/Plan ?Active Problems: ?  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  Aspiration pneumonia ([place]) ?  Paroxysmal A-fib ([place]) ?  Tracheal stenosis ?  Acute respiratory failure with hypoxia ([place]) ? ? ?Acute on chronic respiratory failure with hypoxia we will continue with T-piece and try capping once again. ?Aspiration pneumonia patient remains at risk for aspiration copious secretions ?Paroxysmal atrial fibrillation rate is controlled ?Tracheal stenosis seems to be an issue possibly why patient is not capping ?Nontraumatic thalamic stroke supportive care ? ? ?I have personally seen and evaluated the patient, evaluated laboratory and imaging results, formulated the assessment and plan and placed orders. ?The Patient requires high complexity decision making with multiple systems involvement.  ?Rounds were done with the Respiratory Therapy Director and Staff therapists and discussed with nursing staff also. ? ?Allyne Gee, MD FCCP ?Pulmonary Critical Care Medicine ?Sleep Medicine ? ?

## 2021-10-30 NOTE — Progress Notes (Signed)
Pulmonary Critical Care Medicine ?[place] ?  ?PULMONARY CRITICAL CARE SERVICE ? ?PROGRESS NOTE ? ? ? ? ?Caleb Vaughan  ?[id number]: 06-15-1961  ? ?DOA: 10/16/2021 ? ?Referring Physician: Satira Sark, MD ? ?HPI: Caleb Vaughan is a 61 y.o. male being followed for ventilator/airway/oxygen weaning Acute on Chronic Respiratory Failure.  Patient is on T-piece has been on 28% FiO2 with good saturations ? ?Medications: ?Reviewed on Rounds ? ?Physical Exam: ? ?Vitals: Temperature 98.4 pulse 73 respiratory is 18 blood pressure is 139/77 saturations 98% ? ?Ventilator Settings off the ventilator on T collar on 28% FiO2 ? ?General: Comfortable at this time ?Neck: supple ?Cardiovascular: no malignant arrhythmias ?Respiratory: No rhonchi very coarse breath sounds ?Skin: no rash seen on limited exam ?Musculoskeletal: No gross abnormality ?Psychiatric:unable to assess ?Neurologic:no involuntary movements   ?   ?   ?Lab Data:  ? ?Basic Metabolic Panel: ?Recent Labs  ?Lab 10/25/21 ?1200 10/26/21 ?[id number] 10/26/21 ?1918 10/27/21 ?0411 10/28/21 ?0533  ?NA 144 149*  --  146* 142  ?K 3.7 3.7 3.3* 3.5 3.9  ?CL 102 106  --  103 100  ?CO2 32 32  --  30 31  ?GLUCOSE 239* 158*  --  177* 221*  ?BUN 59* 53*  --  37* 24*  ?CREATININE 1.96* 1.53*  --  1.25* 1.10  ?CALCIUM 9.3 9.2  --  8.9 9.4  ?MG 3.0*  --   --  2.6* 2.6*  ?PHOS 5.2* 4.3  --  3.9 3.9  ? ? ?ABG: ?No results for input(s): PHART, PCO2ART, PO2ART, HCO3, O2SAT in the last 168 hours. ? ?Liver Function Tests: ?Recent Labs  ?Lab 10/25/21 ?1200 10/26/21 ?[id number] 10/27/21 ?0411 10/28/21 ?0533  ?ALBUMIN 3.0* 2.8* 2.7* 2.9*  ? ?No results for input(s): LIPASE, AMYLASE in the last 168 hours. ?No results for input(s): AMMONIA in the last 168 hours. ? ?CBC: ?Recent Labs  ?Lab 10/25/21 ?1200 10/27/21 ?0411 10/28/21 ?0533  ?WBC 12.1* 9.9 8.9  ?HGB 11.2* 10.4* 11.3*  ?HCT 34.6* 31.9* 34.4*  ?MCV 92.8 93.8 92.0  ?PLT 289 267 285  ? ? ?Cardiac Enzymes: ?No results for  input(s): CKTOTAL, CKMB, CKMBINDEX, TROPONINI in the last 168 hours. ? ?BNP (last 3 results) ?No results for input(s): BNP in the last 8760 hours. ? ?ProBNP (last 3 results) ?No results for input(s): PROBNP in the last 8760 hours. ? ?Radiological Exams: ?No results found. ? ?Assessment/Plan ?Active Problems: ?  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  Aspiration pneumonia ([place]) ?  Paroxysmal A-fib ([place]) ?  Tracheal stenosis ?  Acute respiratory failure with hypoxia ([place]) ? ? ?Acute on chronic respiratory failure with hypoxia patient has not been able to tolerate capping trials we will continue to assess.  This is despite having changed his trach out ?Aspiration pneumonia has been treated with antibiotics we will continue to follow along closely. ?Paroxysmal atrial fibrillation rate is controlled at this time ?Tracheal stenosis remains an issue ?Nontraumatic thalamic stroke supportive care we will continue to follow along ? ? ?I have personally seen and evaluated the patient, evaluated laboratory and imaging results, formulated the assessment and plan and placed orders. ?The Patient requires high complexity decision making with multiple systems involvement.  ?Rounds were done with the Respiratory Therapy Director and Staff therapists and discussed with nursing staff also. ? ?Allyne Gee, MD FCCP ?Pulmonary Critical Care Medicine ?Sleep Medicine ? ?

## 2021-10-31 LAB — CBC
HCT: 35.1 % — ABNORMAL LOW (ref 39.0–52.0)
Hemoglobin: 11.8 g/dL — ABNORMAL LOW (ref 13.0–17.0)
MCH: 30.4 pg (ref 26.0–34.0)
MCHC: 33.6 g/dL (ref 30.0–36.0)
MCV: 90.5 fL (ref 80.0–100.0)
Platelets: 319 10*3/uL (ref 150–400)
RBC: 3.88 MIL/uL — ABNORMAL LOW (ref 4.22–5.81)
RDW: 12.7 % (ref 11.5–15.5)
WBC: 8.3 10*3/uL (ref 4.0–10.5)
nRBC: 0 % (ref 0.0–0.2)

## 2021-10-31 LAB — BASIC METABOLIC PANEL
Anion gap: 13 (ref 5–15)
BUN: 27 mg/dL — ABNORMAL HIGH (ref 6–20)
CO2: 32 mmol/L (ref 22–32)
Calcium: 9.5 mg/dL (ref 8.9–10.3)
Chloride: 99 mmol/L (ref 98–111)
Creatinine, Ser: 0.97 mg/dL (ref 0.61–1.24)
GFR, Estimated: >60 mL/min (ref >60)
Glucose, Bld: 175 mg/dL — ABNORMAL HIGH (ref 70–99)
Potassium: 3.7 mmol/L (ref 3.5–5.1)
Sodium: 144 mmol/L (ref 135–145)

## 2021-11-01 NOTE — Progress Notes (Signed)
Pulmonary Critical Care Medicine ?[place] ?  ?PULMONARY CRITICAL CARE SERVICE ? ?PROGRESS NOTE ? ? ? ? ?Caleb Vaughan  ?[id number]: 02-11-1961  ? ?DOA: 10/16/2021 ? ?Referring Physician: Satira Sark, MD ? ?HPI: Caleb Vaughan is a 61 y.o. male being followed for ventilator/airway/oxygen weaning Acute on Chronic Respiratory Failure.  Patient has been having issues with secretions and mucous plug overnight.  Has a #4 trach in place but good respiratory therapy during rounds to increase to #6 trach ? ?Medications: ?Reviewed on Rounds ? ?Physical Exam: ? ?Vitals: Temperature 97.9 pulse of 80 respiratory 22 blood pressure is 135/60 saturations 98% ? ?Ventilator Settings off the ventilator on T-piece ? ?General: Comfortable at this time ?Neck: supple ?Cardiovascular: no malignant arrhythmias ?Respiratory: No rhonchi very coarse breath sounds ?Skin: no rash seen on limited exam ?Musculoskeletal: No gross abnormality ?Psychiatric:unable to assess ?Neurologic:no involuntary movements   ?   ?   ?Lab Data:  ? ?Basic Metabolic Panel: ?Recent Labs  ?Lab 10/25/21 ?1200 10/26/21 ?[id number] 10/26/21 ?1918 10/27/21 ?0411 10/28/21 ?[id number] 10/31/21 ?[id number]  ?NA 144 149*  --  146* 142 144  ?K 3.7 3.7 3.3* 3.5 3.9 3.7  ?CL 102 106  --  103 100 99  ?CO2 32 32  --  30 31 32  ?GLUCOSE 239* 158*  --  177* 221* 175*  ?BUN 59* 53*  --  37* 24* 27*  ?CREATININE 1.96* 1.53*  --  1.25* 1.10 0.97  ?CALCIUM 9.3 9.2  --  8.9 9.4 9.5  ?MG 3.0*  --   --  2.6* 2.6*  --   ?PHOS 5.2* 4.3  --  3.9 3.9  --   ? ? ?ABG: ?No results for input(s): PHART, PCO2ART, PO2ART, HCO3, O2SAT in the last 168 hours. ? ?Liver Function Tests: ?Recent Labs  ?Lab 10/25/21 ?1200 10/26/21 ?[id number] 10/27/21 ?0411 10/28/21 ?0533  ?ALBUMIN 3.0* 2.8* 2.7* 2.9*  ? ?No results for input(s): LIPASE, AMYLASE in the last 168 hours. ?No results for input(s): AMMONIA in the last 168 hours. ? ?CBC: ?Recent Labs  ?Lab 10/25/21 ?1200 10/27/21 ?0411 10/28/21 ?[id number]  10/31/21 ?[id number]  ?WBC 12.1* 9.9 8.9 8.3  ?HGB 11.2* 10.4* 11.3* 11.8*  ?HCT 34.6* 31.9* 34.4* 35.1*  ?MCV 92.8 93.8 92.0 90.5  ?PLT 289 267 285 319  ? ? ?Cardiac Enzymes: ?No results for input(s): CKTOTAL, CKMB, CKMBINDEX, TROPONINI in the last 168 hours. ? ?BNP (last 3 results) ?No results for input(s): BNP in the last 8760 hours. ? ?ProBNP (last 3 results) ?No results for input(s): PROBNP in the last 8760 hours. ? ?Radiological Exams: ?No results found. ? ?Assessment/Plan ?Active Problems: ?  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  Aspiration pneumonia ([place]) ?  Paroxysmal A-fib ([place]) ?  Tracheal stenosis ?  Acute respiratory failure with hypoxia ([place]) ? ? ?Acute on chronic respiratory failure with hypoxia plan is going to be to change him back over to a #6 cuffless trach as was previously discussed I think with this plugging this is of concern a large bore trach may actually be more helpful to ?Paroxysmal atrial fibrillation rate is controlled we will continue to monitor along closely. ?Aspiration pneumonia has been treated with antibiotic ?Tracheal stenosis no change continue with supportive care ?Nontraumatic thalamic stroke prognosis remains guarded ? ? ?I have personally seen and evaluated the patient, evaluated laboratory and imaging results, formulated the assessment and plan and placed orders. ?The Patient requires high complexity decision making with  multiple systems involvement.  ?Rounds were done with the Respiratory Therapy Director and Staff therapists and discussed with nursing staff also. ? ?Allyne Gee, MD FCCP ?Pulmonary Critical Care Medicine ?Sleep Medicine ? ?

## 2021-11-02 NOTE — Progress Notes (Signed)
Pulmonary Critical Care Medicine ?Caleb Vaughan ?  ?PULMONARY CRITICAL CARE SERVICE ? ?PROGRESS NOTE ? ? ? ? ?Caleb Vaughan  ?[id number]: [birth year]  ? ?DOA: 10/16/2021 ? ?Referring Physician: Satira Sark, MD ? ?HPI: Caleb Vaughan is a 61 y.o. male being followed for ventilator/airway/oxygen weaning Acute on Chronic Respiratory Failure.  Patient currently is on T collar on 28% FiO2.  Yesterday we switched over to a #6 trach doing better ? ?Medications: ?Reviewed on Rounds ? ?Physical Exam: ? ?Vitals: Temperature is 97.9 pulse of 77 respiratory 20 blood pressure is 118/65 saturations 97% ? ?Ventilator Settings off the ventilator on T collar on 28% FiO2 ? ?General: Comfortable at this time ?Neck: supple ?Cardiovascular: no malignant arrhythmias ?Respiratory: Scattered rhonchi expansion is equal ?Skin: no rash seen on limited exam ?Musculoskeletal: No gross abnormality ?Psychiatric:unable to assess ?Neurologic:no involuntary movements   ?   ?   ?Lab Data:  ? ?Basic Metabolic Panel: ?Recent Labs  ?Lab 10/26/21 ?1918 10/27/21 ?0411 10/28/21 ?[id number] 10/31/21 ?[id number]  ?NA  --  146* 142 144  ?K 3.3* 3.5 3.9 3.7  ?CL  --  103 100 99  ?CO2  --  30 31 32  ?GLUCOSE  --  177* 221* 175*  ?BUN  --  37* 24* 27*  ?CREATININE  --  1.25* 1.10 0.97  ?CALCIUM  --  8.9 9.4 9.5  ?MG  --  2.6* 2.6*  --   ?PHOS  --  3.9 3.9  --   ? ? ?ABG: ?No results for input(s): PHART, PCO2ART, PO2ART, HCO3, O2SAT in the last 168 hours. ? ?Liver Function Tests: ?Recent Labs  ?Lab 10/27/21 ?0411 10/28/21 ?0533  ?ALBUMIN 2.7* 2.9*  ? ?No results for input(s): LIPASE, AMYLASE in the last 168 hours. ?No results for input(s): AMMONIA in the last 168 hours. ? ?CBC: ?Recent Labs  ?Lab 10/27/21 ?0411 10/28/21 ?[id number] 10/31/21 ?[id number]  ?WBC 9.9 8.9 8.3  ?HGB 10.4* 11.3* 11.8*  ?HCT 31.9* 34.4* 35.1*  ?MCV 93.8 92.0 90.5  ?PLT 267 285 319  ? ? ?Cardiac Enzymes: ?No results for input(s): CKTOTAL, CKMB, CKMBINDEX, TROPONINI in the last 168  hours. ? ?BNP (last 3 results) ?No results for input(s): BNP in the last 8760 hours. ? ?ProBNP (last 3 results) ?No results for input(s): PROBNP in the last 8760 hours. ? ?Radiological Exams: ?No results found. ? ?Assessment/Plan ?Active Problems: ?  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  Aspiration pneumonia ([place]) ?  Paroxysmal A-fib ([facility] Vaughan) ?  Tracheal stenosis ?  Acute respiratory failure with hypoxia (Caleb Vaughan) ? ? ?Acute on chronic respiratory failure hypoxia we will continue with the T collar doing better since the trach was changed out ?Aspiration pneumonia treated we will continue with supportive care ?Paroxysmal atrial fibrillation rate is controlled ?Tracheal stenosis no change we will continue to follow ?Nontraumatic thalamic stroke supportive care ? ? ?I have personally seen and evaluated the patient, evaluated laboratory and imaging results, formulated the assessment and plan and placed orders. ?The Patient requires high complexity decision making with multiple systems involvement.  ?Rounds were done with the Respiratory Therapy Director and Staff therapists and discussed with nursing staff also. ? ?Allyne Gee, MD FCCP ?Pulmonary Critical Care Medicine ?Sleep Medicine ? ?

## 2021-11-03 LAB — CBC
HCT: 37.2 % — ABNORMAL LOW (ref 39.0–52.0)
Hemoglobin: 12 g/dL — ABNORMAL LOW (ref 13.0–17.0)
MCH: 30.6 pg (ref 26.0–34.0)
MCHC: 32.3 g/dL (ref 30.0–36.0)
MCV: 94.9 fL (ref 80.0–100.0)
Platelets: 364 10*3/uL (ref 150–400)
RBC: 3.92 MIL/uL — ABNORMAL LOW (ref 4.22–5.81)
RDW: 13.1 % (ref 11.5–15.5)
WBC: 8.8 10*3/uL (ref 4.0–10.5)
nRBC: 0 % (ref 0.0–0.2)

## 2021-11-03 LAB — BASIC METABOLIC PANEL
Anion gap: 11 (ref 5–15)
BUN: 44 mg/dL — ABNORMAL HIGH (ref 6–20)
CO2: 35 mmol/L — ABNORMAL HIGH (ref 22–32)
Calcium: 9.8 mg/dL (ref 8.9–10.3)
Chloride: 105 mmol/L (ref 98–111)
Creatinine, Ser: 1.64 mg/dL — ABNORMAL HIGH (ref 0.61–1.24)
GFR, Estimated: 48 mL/min — ABNORMAL LOW (ref >60)
Glucose, Bld: 169 mg/dL — ABNORMAL HIGH (ref 70–99)
Potassium: 3.8 mmol/L (ref 3.5–5.1)
Sodium: 151 mmol/L — ABNORMAL HIGH (ref 135–145)

## 2021-11-03 LAB — MAGNESIUM: Magnesium: 2.9 mg/dL — ABNORMAL HIGH (ref 1.7–2.4)

## 2021-11-03 NOTE — Progress Notes (Signed)
Pulmonary Critical Care Medicine ?[place] ?  ?PULMONARY CRITICAL CARE SERVICE ? ?PROGRESS NOTE ? ? ? ? ?Caleb Vaughan  ?[id number]: April 14, 1961  ? ?DOA: 10/16/2021 ? ?Referring Physician: Satira Sark, MD ? ?HPI: Caleb Vaughan is a 61 y.o. male being followed for ventilator/airway/oxygen weaning Acute on Chronic Respiratory Failure.  Patient is on T collar on 28% FiO2 was attempted at capping however failed ? ?Medications: ?Reviewed on Rounds ? ?Physical Exam: ? ?Vitals: Temperature is 97 pulse 64 respiratory 16 blood pressure is 127/66 saturations 97% ? ?Ventilator Settings off the ventilator on T-piece FiO2 28% ? ?General: Comfortable at this time ?Neck: supple ?Cardiovascular: no malignant arrhythmias ?Respiratory: No rhonchi very coarse breath sounds ?Skin: no rash seen on limited exam ?Musculoskeletal: No gross abnormality ?Psychiatric:unable to assess ?Neurologic:no involuntary movements   ?   ?   ?Lab Data:  ? ?Basic Metabolic Panel: ?Recent Labs  ?Lab 10/28/21 ?[id number] 10/31/21 ?[id number] 11/03/21 ?[id number]  ?NA 142 144 151*  ?K 3.9 3.7 3.8  ?CL 100 99 105  ?CO2 31 32 35*  ?GLUCOSE 221* 175* 169*  ?BUN 24* 27* 44*  ?CREATININE 1.10 0.97 1.64*  ?CALCIUM 9.4 9.5 9.8  ?MG 2.6*  --  2.9*  ?PHOS 3.9  --   --   ? ? ?ABG: ?No results for input(s): PHART, PCO2ART, PO2ART, HCO3, O2SAT in the last 168 hours. ? ?Liver Function Tests: ?Recent Labs  ?Lab 10/28/21 ?0533  ?ALBUMIN 2.9*  ? ?No results for input(s): LIPASE, AMYLASE in the last 168 hours. ?No results for input(s): AMMONIA in the last 168 hours. ? ?CBC: ?Recent Labs  ?Lab 10/28/21 ?[id number] 10/31/21 ?[id number] 11/03/21 ?[id number]  ?WBC 8.9 8.3 8.8  ?HGB 11.3* 11.8* 12.0*  ?HCT 34.4* 35.1* 37.2*  ?MCV 92.0 90.5 94.9  ?PLT 285 319 364  ? ? ?Cardiac Enzymes: ?No results for input(s): CKTOTAL, CKMB, CKMBINDEX, TROPONINI in the last 168 hours. ? ?BNP (last 3 results) ?No results for input(s): BNP in the last 8760 hours. ? ?ProBNP (last 3 results) ?No  results for input(s): PROBNP in the last 8760 hours. ? ?Radiological Exams: ?No results found. ? ?Assessment/Plan ?Active Problems: ?  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  Aspiration pneumonia ([place]) ?  Paroxysmal A-fib ([place]) ?  Tracheal stenosis ?  Acute respiratory failure with hypoxia ([place]) ? ? ?Acute on chronic respiratory failure with hypoxia plan is to continue with T-piece for now patient is not tolerating capping ?Aspiration pneumonia has been treated with antibiotics we will continue to follow along ?Paroxysmal atrial fibrillation rate is controlled at this time ?Tracheal stenosis no change overall ?Nontraumatic thalamic stroke supportive care ? ? ?I have personally seen and evaluated the patient, evaluated laboratory and imaging results, formulated the assessment and plan and placed orders. ?The Patient requires high complexity decision making with multiple systems involvement.  ?Rounds were done with the Respiratory Therapy Director and Staff therapists and discussed with nursing staff also. ? ?Allyne Gee, MD FCCP ?Pulmonary Critical Care Medicine ?Sleep Medicine ? ?

## 2021-11-04 LAB — RENAL FUNCTION PANEL
Albumin: 3.2 g/dL — ABNORMAL LOW (ref 3.5–5.0)
Anion gap: 10 (ref 5–15)
BUN: 44 mg/dL — ABNORMAL HIGH (ref 6–20)
CO2: 34 mmol/L — ABNORMAL HIGH (ref 22–32)
Calcium: 9.3 mg/dL (ref 8.9–10.3)
Chloride: 103 mmol/L (ref 98–111)
Creatinine, Ser: 1.37 mg/dL — ABNORMAL HIGH (ref 0.61–1.24)
GFR, Estimated: 59 mL/min — ABNORMAL LOW (ref >60)
Glucose, Bld: 162 mg/dL — ABNORMAL HIGH (ref 70–99)
Phosphorus: 4.1 mg/dL (ref 2.5–4.6)
Potassium: 3.3 mmol/L — ABNORMAL LOW (ref 3.5–5.1)
Sodium: 147 mmol/L — ABNORMAL HIGH (ref 135–145)

## 2021-11-04 LAB — POTASSIUM: Potassium: 3.8 mmol/L (ref 3.5–5.1)

## 2021-11-04 NOTE — Progress Notes (Signed)
Pulmonary Critical Care Medicine ?[place] ?  ?PULMONARY CRITICAL CARE SERVICE ? ?PROGRESS NOTE ? ? ? ? ?Caleb Vaughan  ?[id number]: [birth year]  ? ?DOA: 10/16/2021 ? ?Referring Physician: Satira Sark, MD ? ?HPI: Bohannon Compher is a 61 y.o. male being followed for ventilator/airway/oxygen weaning Acute on Chronic Respiratory Failure.  Patient is comfortable without distress has been on T collar on 28% FiO2 using PMV ? ?Medications: ?Reviewed on Rounds ? ?Physical Exam: ? ?Vitals: Temperature is 97.0 pulse 67 respiratory is 12 blood pressure is 130/72 saturations 96% ? ?Ventilator Settings patient is on T-piece FiO2 is 28% ? ?General: Comfortable at this time ?Neck: supple ?Cardiovascular: no malignant arrhythmias ?Respiratory: No rhonchi very coarse breath sounds ?Skin: no rash seen on limited exam ?Musculoskeletal: No gross abnormality ?Psychiatric:unable to assess ?Neurologic:no involuntary movements   ?   ?   ?Lab Data:  ? ?Basic Metabolic Panel: ?Recent Labs  ?Lab 10/31/21 ?0306 11/03/21 ?0427 11/04/21 ?[id number]  ?NA 144 151* 147*  ?K 3.7 3.8 3.3*  ?CL 99 105 103  ?CO2 32 35* 34*  ?GLUCOSE 175* 169* 162*  ?BUN 27* 44* 44*  ?CREATININE 0.97 1.64* 1.37*  ?CALCIUM 9.5 9.8 9.3  ?MG  --  2.9*  --   ?PHOS  --   --  4.1  ? ? ?ABG: ?No results for input(s): PHART, PCO2ART, PO2ART, HCO3, O2SAT in the last 168 hours. ? ?Liver Function Tests: ?Recent Labs  ?Lab 11/04/21 ?0608  ?ALBUMIN 3.2*  ? ?No results for input(s): LIPASE, AMYLASE in the last 168 hours. ?No results for input(s): AMMONIA in the last 168 hours. ? ?CBC: ?Recent Labs  ?Lab 10/31/21 ?0306 11/03/21 ?[id number]  ?WBC 8.3 8.8  ?HGB 11.8* 12.0*  ?HCT 35.1* 37.2*  ?MCV 90.5 94.9  ?PLT 319 364  ? ? ?Cardiac Enzymes: ?No results for input(s): CKTOTAL, CKMB, CKMBINDEX, TROPONINI in the last 168 hours. ? ?BNP (last 3 results) ?No results for input(s): BNP in the last 8760 hours. ? ?ProBNP (last 3 results) ?No results for input(s): PROBNP in  the last 8760 hours. ? ?Radiological Exams: ?No results found. ? ?Assessment/Plan ?Active Problems: ?  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  Aspiration pneumonia ([place]) ?  Paroxysmal A-fib ([place]) ?  Tracheal stenosis ?  Acute respiratory failure with hypoxia ([place]) ? ? ?Acute on chronic respiratory failure hypoxia we will continue with T-piece patient is at baseline has not been tolerating weaning with capping trials so therefore patient is at baseline until can be evaluated by ENT as an outpatient possibly ?Aspiration pneumonia patient has been treated with antibiotics slowly improving ?Paroxysmal atrial fibrillation rate is controlled at this time ?Tracheal stenosis supportive care ?Nontraumatic thalamic stroke prognosis is guarded ? ? ?I have personally seen and evaluated the patient, evaluated laboratory and imaging results, formulated the assessment and plan and placed orders. ?The Patient requires high complexity decision making with multiple systems involvement.  ?Rounds were done with the Respiratory Therapy Director and Staff therapists and discussed with nursing staff also. ? ?Allyne Gee, MD FCCP ?Pulmonary Critical Care Medicine ?Sleep Medicine ? ?

## 2021-11-05 LAB — RENAL FUNCTION PANEL
Albumin: 3.3 g/dL — ABNORMAL LOW (ref 3.5–5.0)
Anion gap: 8 (ref 5–15)
BUN: 37 mg/dL — ABNORMAL HIGH (ref 6–20)
CO2: 35 mmol/L — ABNORMAL HIGH (ref 22–32)
Calcium: 9.6 mg/dL (ref 8.9–10.3)
Chloride: 105 mmol/L (ref 98–111)
Creatinine, Ser: 1.23 mg/dL (ref 0.61–1.24)
GFR, Estimated: >60 mL/min (ref >60)
Glucose, Bld: 125 mg/dL — ABNORMAL HIGH (ref 70–99)
Phosphorus: 3.6 mg/dL (ref 2.5–4.6)
Potassium: 3.6 mmol/L (ref 3.5–5.1)
Sodium: 148 mmol/L — ABNORMAL HIGH (ref 135–145)

## 2021-11-05 NOTE — Progress Notes (Signed)
Pulmonary Critical Care Medicine ?[facility] [place] ?  ?PULMONARY CRITICAL CARE SERVICE ? ?PROGRESS NOTE ? ? ? ? ?Caleb Vaughan  ?[id number]  ?DOB: September 12, 1960  ? ?DOA: 10/16/2021 ? ?Referring Physician: Luna Kitchens, MD ? ?HPI: Caleb Vaughan is a 61 y.o. male being followed for ventilator/airway/oxygen weaning Acute on Chronic Respiratory Failure.  Patient typically PMV at baseline.  Attempted downsize trach to 4, failed with capping.  Replaced with a 6 XLT, at this time we recommend no capping.  Patient had large mucous plug over weekend.  Mittens in place ? ?Medications: ?Reviewed on Rounds ? ?Physical Exam: ? ?Vitals: Temp 97.1, pulse 76, respirations 28, BP 151/82, SPO2 96% ? ?Ventilator Settings 28% ATC, PMV currently not in place ? ?General: Comfortable at this time ?Neck: supple ?Cardiovascular: no malignant arrhythmias ?Respiratory: Bilaterally clear in all lobes ?Skin: no rash seen on limited exam ?Musculoskeletal: No gross abnormality ?Psychiatric:unable to assess ?Neurologic:no involuntary movements   ?   ?   ?Lab Data:  ? ?Basic Metabolic Panel: ?Recent Labs  ?Lab 10/31/21 ?0306 11/03/21 ?0427 11/04/21 ?6301 11/04/21 ?1920 11/05/21 ?6010  ?NA 144 151* 147*  --  148*  ?K 3.7 3.8 3.3* 3.8 3.6  ?CL 99 105 103  --  105  ?CO2 32 35* 34*  --  35*  ?GLUCOSE 175* 169* 162*  --  125*  ?BUN 27* 44* 44*  --  37*  ?CREATININE 0.97 1.64* 1.37*  --  1.23  ?CALCIUM 9.5 9.8 9.3  --  9.6  ?MG  --  2.9*  --   --   --   ?PHOS  --   --  4.1  --  3.6  ? ? ?ABG: ?No results for input(s): PHART, PCO2ART, PO2ART, HCO3, O2SAT in the last 168 hours. ? ?Liver Function Tests: ?Recent Labs  ?Lab 11/04/21 ?0608 11/05/21 ?9323  ?ALBUMIN 3.2* 3.3*  ? ?No results for input(s): LIPASE, AMYLASE in the last 168 hours. ?No results for input(s): AMMONIA in the last 168 hours. ? ?CBC: ?Recent Labs  ?Lab 10/31/21 ?0306 11/03/21 ?0427  ?WBC 8.3 8.8  ?HGB 11.8* 12.0*  ?HCT 35.1* 37.2*  ?MCV 90.5 94.9  ?PLT 319 364   ? ? ?Cardiac Enzymes: ?No results for input(s): CKTOTAL, CKMB, CKMBINDEX, TROPONINI in the last 168 hours. ? ?BNP (last 3 results) ?No results for input(s): BNP in the last 8760 hours. ? ?ProBNP (last 3 results) ?No results for input(s): PROBNP in the last 8760 hours. ? ?Radiological Exams: ?No results found. ? ?Assessment/Plan ?Active Problems: ?  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  Aspiration pneumonia ([facility]) ?  Paroxysmal A-fib ([facility]) ?  Tracheal stenosis ?  Acute respiratory failure with hypoxia ([facility]) ? ? ?Acute on chronic respiratory failure with hypoxia.  Continue with T-piece as tolerated.  Baseline PMV.  Patient however has not been tolerating capping, at this time no capping is suggested.  6 XLT in place.  Patient can see ENT as outpatient. ?Aspiration pneumonia, has been treated, slowly improving ?Paroxysmal A-fib, controlled on amlodipine ?Tracheal stenosis, continue supportive care, ENT outpatient ?Traumatic thalamic stroke, guarded prognosis ? ? ?I have personally seen and evaluated the patient, evaluated laboratory and imaging results, formulated the assessment and plan and placed orders. ?The Patient requires high complexity decision making with multiple systems involvement.  ?Rounds were done with the Respiratory Therapy Director and Staff therapists and discussed with nursing staff also. ? ?Yevonne Pax, MD FCCP ?Pulmonary Critical Care Medicine ?Sleep  Medicine ? ?

## 2021-11-06 LAB — CBC
HCT: 34.1 % — ABNORMAL LOW (ref 39.0–52.0)
Hemoglobin: 11.1 g/dL — ABNORMAL LOW (ref 13.0–17.0)
MCH: 30.2 pg (ref 26.0–34.0)
MCHC: 32.6 g/dL (ref 30.0–36.0)
MCV: 92.9 fL (ref 80.0–100.0)
Platelets: 313 10*3/uL (ref 150–400)
RBC: 3.67 MIL/uL — ABNORMAL LOW (ref 4.22–5.81)
RDW: 12.6 % (ref 11.5–15.5)
WBC: 8.1 10*3/uL (ref 4.0–10.5)
nRBC: 0 % (ref 0.0–0.2)

## 2021-11-06 LAB — COMPREHENSIVE METABOLIC PANEL
ALT: 39 U/L (ref 0–44)
AST: 22 U/L (ref 15–41)
Albumin: 3.2 g/dL — ABNORMAL LOW (ref 3.5–5.0)
Alkaline Phosphatase: 116 U/L (ref 38–126)
Anion gap: 10 (ref 5–15)
BUN: 38 mg/dL — ABNORMAL HIGH (ref 6–20)
CO2: 30 mmol/L (ref 22–32)
Calcium: 9.5 mg/dL (ref 8.9–10.3)
Chloride: 106 mmol/L (ref 98–111)
Creatinine, Ser: 1.14 mg/dL (ref 0.61–1.24)
GFR, Estimated: >60 mL/min (ref >60)
Glucose, Bld: 155 mg/dL — ABNORMAL HIGH (ref 70–99)
Potassium: 3.7 mmol/L (ref 3.5–5.1)
Sodium: 146 mmol/L — ABNORMAL HIGH (ref 135–145)
Total Bilirubin: 0.3 mg/dL (ref 0.3–1.2)
Total Protein: 6.1 g/dL — ABNORMAL LOW (ref 6.5–8.1)

## 2021-11-06 LAB — MAGNESIUM: Magnesium: 2.5 mg/dL — ABNORMAL HIGH (ref 1.7–2.4)

## 2021-11-06 NOTE — Progress Notes (Addendum)
Pulmonary Critical Care Medicine ?[name] ?  ?PULMONARY CRITICAL CARE SERVICE ? ?PROGRESS NOTE ? ? ? ? ?Caleb Vaughan  ?[id number]: 05-29-1961  ? ?DOA: 10/16/2021 ? ?Referring Physician: Satira Sark, MD ? ?HPI: Caleb Vaughan is a 61 y.o. male being followed for ventilator/airway/oxygen weaning Acute on Chronic Respiratory Failure. Patient seen lying in bed,28% ATC, at baseline. No acute distress, no acute overnight events ? ?Medications: ?Reviewed on Rounds ? ?Physical Exam: ? ?Vitals: temp 97.9, pulse, 67, resp 11, BP 127/5/, SPO2 95% ? ?Ventilator Settings ATC 28% ? ?General: Comfortable at this time ?Neck: supple ?Cardiovascular: no malignant arrhythmias ?Respiratory: Bilaterally clear in all lobes ?Skin: no rash seen on limited exam ?Musculoskeletal: No gross abnormality ?Psychiatric:unable to assess ?Neurologic:no involuntary movements   ?   ?   ?Lab Data:  ? ?Basic Metabolic Panel: ?Recent Labs  ?Lab 10/31/21 ?0306 11/03/21 ?0427 11/04/21 ?[id number] 11/04/21 ?1920 11/05/21 ?[id number] 11/06/21 ?0744  ?NA 144 151* 147*  --  148* 146*  ?K 3.7 3.8 3.3* 3.8 3.6 3.7  ?CL 99 105 103  --  105 106  ?CO2 32 35* 34*  --  35* 30  ?GLUCOSE 175* 169* 162*  --  125* 155*  ?BUN 27* 44* 44*  --  37* 38*  ?CREATININE 0.97 1.64* 1.37*  --  1.23 1.14  ?CALCIUM 9.5 9.8 9.3  --  9.6 9.5  ?MG  --  2.9*  --   --   --  2.5*  ?PHOS  --   --  4.1  --  3.6  --   ? ? ?ABG: ?No results for input(s): PHART, PCO2ART, PO2ART, HCO3, O2SAT in the last 168 hours. ? ?Liver Function Tests: ?Recent Labs  ?Lab 11/04/21 ?0608 11/05/21 ?[id number] 11/06/21 ?0744  ?AST  --   --  22  ?ALT  --   --  39  ?ALKPHOS  --   --  116  ?BILITOT  --   --  0.3  ?PROT  --   --  6.1*  ?ALBUMIN 3.2* 3.3* 3.2*  ? ?No results for input(s): LIPASE, AMYLASE in the last 168 hours. ?No results for input(s): AMMONIA in the last 168 hours. ? ?CBC: ?Recent Labs  ?Lab 10/31/21 ?0306 11/03/21 ?0427 11/06/21 ?0744  ?WBC 8.3 8.8 8.1  ?HGB 11.8* 12.0* 11.1*   ?HCT 35.1* 37.2* 34.1*  ?MCV 90.5 94.9 92.9  ?PLT 319 364 313  ? ? ?Cardiac Enzymes: ?No results for input(s): CKTOTAL, CKMB, CKMBINDEX, TROPONINI in the last 168 hours. ? ?BNP (last 3 results) ?No results for input(s): BNP in the last 8760 hours. ? ?ProBNP (last 3 results) ?No results for input(s): PROBNP in the last 8760 hours. ? ?Radiological Exams: ?No results found. ? ?Assessment/Plan ?Active Problems: ?  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  Aspiration pneumonia ([place]) ?  Paroxysmal A-fib ([place]) ?  Tracheal stenosis ?  Acute respiratory failure with hypoxia ([place]) ? ? ?Acute on chronic respiratory failure with hypoxia.  Continue with T-piece as tolerated.  Baseline PMV.  Patient however has not been tolerating capping, will hold capping trials for now.  6 XLT in place.  Patient can see ENT as outpatient. ?Aspiration pneumonia, has been treated, continues  to clinically improve ?Paroxysmal A-fib, controlled on amlodipine ?Tracheal stenosis, continue supportive care, ENT outpatient ?Traumatic thalamic stroke, guarded prognosis ? ? ?I have personally seen and evaluated the patient, evaluated laboratory and imaging results, formulated the assessment and plan and placed  orders. ?The Patient requires high complexity decision making with multiple systems involvement.  ?Rounds were done with the Respiratory Therapy Director and Staff therapists and discussed with nursing staff also. ? ?Allyne Gee, MD FCCP ?Pulmonary Critical Care Medicine ?Sleep Medicine  ?

## 2021-11-07 NOTE — Progress Notes (Addendum)
Pulmonary Critical Care Medicine ?[facility] ?  ?PULMONARY CRITICAL CARE SERVICE ? ?PROGRESS NOTE ? ? ? ? ?[name]  ?[id number]: 1961-02-06  ? ?DOA: 10/16/2021 ? ?Referring Physician: Satira Sark, MD ? ?HPI: Reyan Izzard is a 61 y.o. male being followed for ventilator/airway/oxygen weaning Acute on Chronic Respiratory Failure.  Patient seen lying in bed.  Currently on 2 8% trach collar, mittens in place.  No acute distress, no acute overnight events. ?Medications: ?Reviewed on Rounds ? ?Physical Exam: ? ?Vitals: Temp 97.8, pulse 80, respirations 30, BP 146/55, 93% SPO2 ? ?Ventilator Settings ATC 28% ? ?General: Comfortable at this time ?Neck: supple ?Cardiovascular: no malignant arrhythmias ?Respiratory: Bilaterally clear in all lobes ?Skin: no rash seen on limited exam ?Musculoskeletal: No gross abnormality ?Psychiatric:unable to assess ?Neurologic:no involuntary movements   ?   ?   ?Lab Data:  ? ?Basic Metabolic Panel: ?Recent Labs  ?Lab 11/03/21 ?0427 11/04/21 ?[id number] 11/04/21 ?1920 11/05/21 ?[id number] 11/06/21 ?0744  ?NA 151* 147*  --  148* 146*  ?K 3.8 3.3* 3.8 3.6 3.7  ?CL 105 103  --  105 106  ?CO2 35* 34*  --  35* 30  ?GLUCOSE 169* 162*  --  125* 155*  ?BUN 44* 44*  --  37* 38*  ?CREATININE 1.64* 1.37*  --  1.23 1.14  ?CALCIUM 9.8 9.3  --  9.6 9.5  ?MG 2.9*  --   --   --  2.5*  ?PHOS  --  4.1  --  3.6  --   ? ? ?ABG: ?No results for input(s): PHART, PCO2ART, PO2ART, HCO3, O2SAT in the last 168 hours. ? ?Liver Function Tests: ?Recent Labs  ?Lab 11/04/21 ?0608 11/05/21 ?[id number] 11/06/21 ?0744  ?AST  --   --  22  ?ALT  --   --  39  ?ALKPHOS  --   --  116  ?BILITOT  --   --  0.3  ?PROT  --   --  6.1*  ?ALBUMIN 3.2* 3.3* 3.2*  ? ?No results for input(s): LIPASE, AMYLASE in the last 168 hours. ?No results for input(s): AMMONIA in the last 168 hours. ? ?CBC: ?Recent Labs  ?Lab 11/03/21 ?[id number] 11/06/21 ?[id number]  ?WBC 8.8 8.1  ?HGB 12.0* 11.1*  ?HCT 37.2* 34.1*  ?MCV 94.9 92.9  ?PLT 364  313  ? ? ?Cardiac Enzymes: ?No results for input(s): CKTOTAL, CKMB, CKMBINDEX, TROPONINI in the last 168 hours. ? ?BNP (last 3 results) ?No results for input(s): BNP in the last 8760 hours. ? ?ProBNP (last 3 results) ?No results for input(s): PROBNP in the last 8760 hours. ? ?Radiological Exams: ?No results found. ? ?Assessment/Plan ?Active Problems: ?  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  Aspiration pneumonia ([place]) ?  Paroxysmal A-fib ([place]) ?  Tracheal stenosis ?  Acute respiratory failure with hypoxia ([place]) ? ? ?Acute on chronic respiratory failure with hypoxia.  Continue T-piece as tolerated.  Patient has not been tolerating capping so we will hold trials at this time.  Baseline is PMV ?Aspiration pneumonia, treatment course completed, continues to clinically improve ?Paroxysmal A-fib, controlled on amlodipine ?Tracheal stenosis, continue with supportive care, patient needs to see ENT outpatient ?Traumatic thalamic stroke, prognosis is guarded ? ? ?I have personally seen and evaluated the patient, evaluated laboratory and imaging results, formulated the assessment and plan and placed orders. ?The Patient requires high complexity decision making with multiple systems involvement.  ?Rounds were done with the Respiratory Therapy Director  and Staff therapists and discussed with nursing staff also. ? ?Allyne Gee, MD FCCP ?Pulmonary Critical Care Medicine ?Sleep Medicine ? ?

## 2021-11-08 LAB — RENAL FUNCTION PANEL
Albumin: 2.9 g/dL — ABNORMAL LOW (ref 3.5–5.0)
Anion gap: 11 (ref 5–15)
BUN: 31 mg/dL — ABNORMAL HIGH (ref 6–20)
CO2: 30 mmol/L (ref 22–32)
Calcium: 8.9 mg/dL (ref 8.9–10.3)
Chloride: 100 mmol/L (ref 98–111)
Creatinine, Ser: 1.13 mg/dL (ref 0.61–1.24)
GFR, Estimated: >60 mL/min (ref >60)
Glucose, Bld: 201 mg/dL — ABNORMAL HIGH (ref 70–99)
Phosphorus: 3.4 mg/dL (ref 2.5–4.6)
Potassium: 3.1 mmol/L — ABNORMAL LOW (ref 3.5–5.1)
Sodium: 141 mmol/L (ref 135–145)

## 2021-11-08 NOTE — Progress Notes (Signed)
Pulmonary Critical Care Medicine ?[place] ?  ?PULMONARY CRITICAL CARE SERVICE ? ?PROGRESS NOTE ? ? ? ? ?Caleb Vaughan  ?[id number]: [birth year]  ? ?DOA: 10/16/2021 ? ?Referring Physician: Satira Sark, MD ? ?HPI: [name] is a 61 y.o. male being followed for ventilator/airway/oxygen weaning Acute on Chronic Respiratory Failure.  Patient is comfortable without distress has been on T collar on 28% FiO2 using the PMV ? ?Medications: ?Reviewed on Rounds ? ?Physical Exam: ? ?Vitals: Temperature is 97.4 pulse 69 respiratory 22 blood pressure is 142/79 saturations 98% ? ?Ventilator Settings off the vent on T-piece FiO2 28% ? ?General: Comfortable at this time ?Neck: supple ?Cardiovascular: no malignant arrhythmias ?Respiratory: Coarse rhonchi noted bilaterally ?Skin: no rash seen on limited exam ?Musculoskeletal: No gross abnormality ?Psychiatric:unable to assess ?Neurologic:no involuntary movements   ?   ?   ?Lab Data:  ? ?Basic Metabolic Panel: ?Recent Labs  ?Lab 11/03/21 ?0427 11/04/21 ?[id number] 11/04/21 ?1920 11/05/21 ?[id number] 11/06/21 ?[id number] 11/08/21 ?0358  ?NA 151* 147*  --  148* 146* 141  ?K 3.8 3.3* 3.8 3.6 3.7 3.1*  ?CL 105 103  --  105 106 100  ?CO2 35* 34*  --  35* 30 30  ?GLUCOSE 169* 162*  --  125* 155* 201*  ?BUN 44* 44*  --  37* 38* 31*  ?CREATININE 1.64* 1.37*  --  1.23 1.14 1.13  ?CALCIUM 9.8 9.3  --  9.6 9.5 8.9  ?MG 2.9*  --   --   --  2.5*  --   ?PHOS  --  4.1  --  3.6  --  3.4  ? ? ?ABG: ?No results for input(s): PHART, PCO2ART, PO2ART, HCO3, O2SAT in the last 168 hours. ? ?Liver Function Tests: ?Recent Labs  ?Lab 11/04/21 ?0608 11/05/21 ?[id number] 11/06/21 ?[id number] 11/08/21 ?0358  ?AST  --   --  22  --   ?ALT  --   --  39  --   ?ALKPHOS  --   --  116  --   ?BILITOT  --   --  0.3  --   ?PROT  --   --  6.1*  --   ?ALBUMIN 3.2* 3.3* 3.2* 2.9*  ? ?No results for input(s): LIPASE, AMYLASE in the last 168 hours. ?No results for input(s): AMMONIA in the last 168  hours. ? ?CBC: ?Recent Labs  ?Lab 11/03/21 ?[id number] 11/06/21 ?[id number]  ?WBC 8.8 8.1  ?HGB 12.0* 11.1*  ?HCT 37.2* 34.1*  ?MCV 94.9 92.9  ?PLT 364 313  ? ? ?Cardiac Enzymes: ?No results for input(s): CKTOTAL, CKMB, CKMBINDEX, TROPONINI in the last 168 hours. ? ?BNP (last 3 results) ?No results for input(s): BNP in the last 8760 hours. ? ?ProBNP (last 3 results) ?No results for input(s): PROBNP in the last 8760 hours. ? ?Radiological Exams: ?No results found. ? ?Assessment/Plan ?Active Problems: ?  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  Aspiration pneumonia ([place]) ?  Paroxysmal A-fib ([place]) ?  Tracheal stenosis ?  Acute respiratory failure with hypoxia ([place]) ? ? ?Acute on chronic respiratory failure with hypoxia we will continue with T-piece titrate oxygen as tolerated we will continue secretion management supportive care. ?Aspiration pneumonia has been treated with antibiotics ?Nontraumatic thalamic stroke supportive care therapy as tolerated ?Paroxysmal atrial fibrillation rate is controlled ?Tracheal stenosis supportive care patient is not able to do capping ? ? ?I have personally seen and evaluated the patient, evaluated laboratory and imaging results, formulated the assessment  and plan and placed orders. ?The Patient requires high complexity decision making with multiple systems involvement.  ?Rounds were done with the Respiratory Therapy Director and Staff therapists and discussed with nursing staff also. ? ?Allyne Gee, MD FCCP ?Pulmonary Critical Care Medicine ?Sleep Medicine ? ?

## 2021-11-09 LAB — POTASSIUM: Potassium: 3.5 mmol/L (ref 3.5–5.1)

## 2021-11-09 NOTE — Progress Notes (Incomplete)
{Select_TRH_[id number]}[facility] [place]   PULMONARY CRITICAL CARE SERVICE  PROGRESS NOTE     Caleb Vaughan  [id number]  DOB: 1961/01/04   DOA: 10/16/2021  Referring Physician: Satira Sark, MD  HPI: Caleb Vaughan is a 61 y.o. male being followed for ventilator/airway/oxygen weaning Acute on Chronic Respiratory Failure.  Patient seen sitting up in bed.  Currently on 28% ATC with PMV, this is patient's baseline.  Per respiratory staff family is requesting that we continue trying to Patient, discussion with daughter about patient not tolerating at this time despite multiple efforts . No acute distress, no acute overnight event  Medications: Reviewed on Rounds  Physical Exam:  Vitals: Temp 97.4, pulse 69, respirations 12, BP 164/82, SPO2 97%  Ventilator Settings 28% aerosol trach collar with PMV in place  General: Comfortable at this time Neck: supple Cardiovascular: no malignant arrhythmias Respiratory: Bilaterally clear in all lobes Skin: no rash seen on limited exam Musculoskeletal: No gross abnormality Psychiatric:unable to assess Neurologic:no involuntary movements         Lab Data:   Basic Metabolic Panel: Recent Labs  Lab 11/03/21 0427 11/04/21 0608 11/04/21 1920 11/05/21 0527 11/06/21 0744 11/08/21 0358 11/09/21 0618  NA 151* 147*  --  148* 146* 141  --   K 3.8 3.3* 3.8 3.6 3.7 3.1* 3.5  CL 105 103  --  105 106 100  --   CO2 35* 34*  --  35* 30 30  --   GLUCOSE 169* 162*  --  125* 155* 201*  --   BUN 44* 44*  --  37* 38* 31*  --   CREATININE 1.64* 1.37*  --  1.23 1.14 1.13  --   CALCIUM 9.8 9.3  --  9.6 9.5 8.9  --   MG 2.9*  --   --   --  2.5*  --   --   PHOS  --  4.1  --  3.6  --  3.4  --     ABG: No results for input(s): PHART, PCO2ART, PO2ART, HCO3, O2SAT in the last 168 hours.  Liver Function Tests: Recent Labs  Lab 11/04/21 0608 11/05/21 0527 11/06/21 0744 11/08/21 0358  AST  --    --  22  --   ALT  --   --  39  --   ALKPHOS  --   --  116  --   BILITOT  --   --  0.3  --   PROT  --   --  6.1*  --   ALBUMIN 3.2* 3.3* 3.2* 2.9*   No results for input(s): LIPASE, AMYLASE in the last 168 hours. No results for input(s): AMMONIA in the last 168 hours.  CBC: Recent Labs  Lab 11/03/21 0427 11/06/21 0744  WBC 8.8 8.1  HGB 12.0* 11.1*  HCT 37.2* 34.1*  MCV 94.9 92.9  PLT 364 313    Cardiac Enzymes: No results for input(s): CKTOTAL, CKMB, CKMBINDEX, TROPONINI in the last 168 hours.  BNP (last 3 results) No results for input(s): BNP in the last 8760 hours.  ProBNP (last 3 results) No results for input(s): PROBNP in the last 8760 hours.  Radiological Exams: No results found.  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  Continue  with ATC and titrate oxygen as tolerated.  Continue with secretion management and supportive care Operation pneumonia, antibiotic course has been completed Nontraumatic thalamic stroke, continue supportive care and therapy as tolerated Paroxysmal atrial fib, currently is rate controlled Tracheal stenosis.  Continue supportive care.  Patient has been unable to tolerate capping trials today.  Family is requesting to restart capping trials.  Patient will need ENT evaluation postdischarge.   I have personally seen and evaluated the patient, evaluated laboratory and imaging results, formulated the assessment and plan and placed orders. The Patient requires high complexity decision making with multiple systems involvement.  Rounds were done with the Respiratory Therapy Director and Staff therapists and discussed with nursing staff also.  Allyne Gee, MD [facility] Pulmonary Critical Care Medicine Sleep  Medicine

## 2021-11-10 NOTE — Progress Notes (Addendum)
Pulmonary Critical Care Medicine ?[place] ?  ?PULMONARY CRITICAL CARE SERVICE ? ?PROGRESS NOTE ? ? ? ? ?Caleb Vaughan  ?[id number]: 23-Jun-1961  ? ?DOA: 10/16/2021 ? ?Referring Physician: Satira Sark, MD ? ?HPI: Caleb Vaughan is a 61 y.o. male being followed for ventilator/airway/oxygen weaning Acute on Chronic Respiratory Failure. Patient seen sitting up in bed. Currently on baseline 28% ATC with PMV on during day. No acute distress, no acute overnight events. ? ?Medications: ?Reviewed on Rounds ? ?Physical Exam: ? ?Vitals: Temp 96.4, pulse 63, resp 11, BP 109/56, SPO2 96% ? ?Ventilator Settings ATC 28% with PMV in place ? ?General: Comfortable at this time ?Neck: supple ?Cardiovascular: no malignant arrhythmias ?Respiratory: Bilaterally clear in all lobes ?Skin: no rash seen on limited exam ?Musculoskeletal: No gross abnormality ?Psychiatric:unable to assess ?Neurologic:no involuntary movements   ?   ?   ?Lab Data:  ? ?Basic Metabolic Panel: ?Recent Labs  ?Lab 11/04/21 ?0608 11/04/21 ?1920 11/05/21 ?[id number] 11/06/21 ?[id number] 11/08/21 ?[id number] 11/09/21 ?[id number]  ?NA 147*  --  148* 146* 141  --   ?K 3.3* 3.8 3.6 3.7 3.1* 3.5  ?CL 103  --  105 106 100  --   ?CO2 34*  --  35* 30 30  --   ?GLUCOSE 162*  --  125* 155* 201*  --   ?BUN 44*  --  37* 38* 31*  --   ?CREATININE 1.37*  --  1.23 1.14 1.13  --   ?CALCIUM 9.3  --  9.6 9.5 8.9  --   ?MG  --   --   --  2.5*  --   --   ?PHOS 4.1  --  3.6  --  3.4  --   ? ? ?ABG: ?No results for input(s): PHART, PCO2ART, PO2ART, HCO3, O2SAT in the last 168 hours. ? ?Liver Function Tests: ?Recent Labs  ?Lab 11/04/21 ?0608 11/05/21 ?[id number] 11/06/21 ?[id number] 11/08/21 ?0358  ?AST  --   --  22  --   ?ALT  --   --  39  --   ?ALKPHOS  --   --  116  --   ?BILITOT  --   --  0.3  --   ?PROT  --   --  6.1*  --   ?ALBUMIN 3.2* 3.3* 3.2* 2.9*  ? ?No results for input(s): LIPASE, AMYLASE in the last 168 hours. ?No results for input(s): AMMONIA in the last 168  hours. ? ?CBC: ?Recent Labs  ?Lab 11/06/21 ?[id number]  ?WBC 8.1  ?HGB 11.1*  ?HCT 34.1*  ?MCV 92.9  ?PLT 313  ? ? ?Cardiac Enzymes: ?No results for input(s): CKTOTAL, CKMB, CKMBINDEX, TROPONINI in the last 168 hours. ? ?BNP (last 3 results) ?No results for input(s): BNP in the last 8760 hours. ? ?ProBNP (last 3 results) ?No results for input(s): PROBNP in the last 8760 hours. ? ?Radiological Exams: ?No results found. ? ?Assessment/Plan ?Active Problems: ?  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  Aspiration pneumonia ([place]) ?  Paroxysmal A-fib ([place]) ?  Tracheal stenosis ?  Acute respiratory failure with hypoxia ([place]) ? ?Acute on chronic respiratory failure with hypoxia we will continue with T-piece titrate oxygen as tolerated we will continue secretion management supportive care. ?Aspiration pneumonia has been treated with antibiotics ?Nontraumatic thalamic stroke supportive care therapy as tolerated ?Paroxysmal atrial fibrillation rate is controlled ?Tracheal stenosis -supportive care, patient is not able to do capping. Patient will need ENT eval post discharge ? ? ?  I have personally seen and evaluated the patient, evaluated laboratory and imaging results, formulated the assessment and plan and placed orders. ?The Patient requires high complexity decision making with multiple systems involvement.  ?Rounds were done with the Respiratory Therapy Director and Staff therapists and discussed with nursing staff also. ? ?Allyne Gee, MD FCCP ?Pulmonary Critical Care Medicine ?Sleep Medicine  ?

## 2021-11-11 LAB — RENAL FUNCTION PANEL
Albumin: 2.9 g/dL — ABNORMAL LOW (ref 3.5–5.0)
Anion gap: 7 (ref 5–15)
BUN: 31 mg/dL — ABNORMAL HIGH (ref 6–20)
CO2: 31 mmol/L (ref 22–32)
Calcium: 8.9 mg/dL (ref 8.9–10.3)
Chloride: 100 mmol/L (ref 98–111)
Creatinine, Ser: 0.95 mg/dL (ref 0.61–1.24)
GFR, Estimated: >60 mL/min (ref >60)
Glucose, Bld: 126 mg/dL — ABNORMAL HIGH (ref 70–99)
Phosphorus: 4.4 mg/dL (ref 2.5–4.6)
Potassium: 3.4 mmol/L — ABNORMAL LOW (ref 3.5–5.1)
Sodium: 138 mmol/L (ref 135–145)

## 2021-11-11 LAB — CBC
HCT: 31.4 % — ABNORMAL LOW (ref 39.0–52.0)
Hemoglobin: 10.7 g/dL — ABNORMAL LOW (ref 13.0–17.0)
MCH: 30.7 pg (ref 26.0–34.0)
MCHC: 34.1 g/dL (ref 30.0–36.0)
MCV: 90 fL (ref 80.0–100.0)
Platelets: 252 10*3/uL (ref 150–400)
RBC: 3.49 MIL/uL — ABNORMAL LOW (ref 4.22–5.81)
RDW: 12.7 % (ref 11.5–15.5)
WBC: 8.7 10*3/uL (ref 4.0–10.5)
nRBC: 0 % (ref 0.0–0.2)

## 2021-11-11 LAB — POTASSIUM: Potassium: 3.8 mmol/L (ref 3.5–5.1)

## 2021-11-11 LAB — MAGNESIUM: Magnesium: 2.3 mg/dL (ref 1.7–2.4)

## 2021-11-11 NOTE — Progress Notes (Signed)
Pulmonary Critical Care Medicine ?[place] ?  ?PULMONARY CRITICAL CARE SERVICE ? ?PROGRESS NOTE ? ? ? ? ?Caleb Vaughan  ?[id number]: [birth year]  ? ?DOA: 10/16/2021 ? ?Referring Physician: Satira Sark, MD ? ?HPI: Caleb Vaughan is a 61 y.o. male being followed for ventilator/airway/oxygen weaning Acute on Chronic Respiratory Failure.  Patient seen lying in bed.  ATC 28% in place.  Continue PMV during the day.  No acute distress, no acute overnight events ? ?Medications: ?Reviewed on Rounds ? ?Physical Exam: ? ?Vitals: Temp 97.5, pulse 66, respirations 22, BP 134/65, SPO2 100% ? ?Ventilator Settings ATC 28% with PMV in place ? ?General: Comfortable at this time ?Neck: supple ?Cardiovascular: no malignant arrhythmias ?Respiratory: Bilaterally clear, diminished in the left upper lobe ?Skin: no rash seen on limited exam ?Musculoskeletal: No gross abnormality ?Psychiatric:unable to assess ?Neurologic:no involuntary movements   ?   ?   ?Lab Data:  ? ?Basic Metabolic Panel: ?Recent Labs  ?Lab 11/05/21 ?[id number] 11/06/21 ?[id number] 11/08/21 ?[id number] 11/09/21 ?[id number] 11/11/21 ?[id number]  ?NA 148* 146* 141  --  138  ?K 3.6 3.7 3.1* 3.5 3.4*  ?CL 105 106 100  --  100  ?CO2 35* 30 30  --  31  ?GLUCOSE 125* 155* 201*  --  126*  ?BUN 37* 38* 31*  --  31*  ?CREATININE 1.23 1.14 1.13  --  0.95  ?CALCIUM 9.6 9.5 8.9  --  8.9  ?MG  --  2.5*  --   --  2.3  ?PHOS 3.6  --  3.4  --  4.4  ? ? ?ABG: ?No results for input(s): PHART, PCO2ART, PO2ART, HCO3, O2SAT in the last 168 hours. ? ?Liver Function Tests: ?Recent Labs  ?Lab 11/05/21 ?[id number] 11/06/21 ?[id number] 11/08/21 ?[id number] 11/11/21 ?[id number]  ?AST  --  22  --   --   ?ALT  --  39  --   --   ?ALKPHOS  --  116  --   --   ?BILITOT  --  0.3  --   --   ?PROT  --  6.1*  --   --   ?ALBUMIN 3.3* 3.2* 2.9* 2.9*  ? ?No results for input(s): LIPASE, AMYLASE in the last 168 hours. ?No results for input(s): AMMONIA in the last 168 hours. ? ?CBC: ?Recent Labs  ?Lab 11/06/21 ?[id number] 11/11/21 ?0650   ?WBC 8.1 8.7  ?HGB 11.1* 10.7*  ?HCT 34.1* 31.4*  ?MCV 92.9 90.0  ?PLT 313 252  ? ? ?Cardiac Enzymes: ?No results for input(s): CKTOTAL, CKMB, CKMBINDEX, TROPONINI in the last 168 hours. ? ?BNP (last 3 results) ?No results for input(s): BNP in the last 8760 hours. ? ?ProBNP (last 3 results) ?No results for input(s): PROBNP in the last 8760 hours. ? ?Radiological Exams: ?No results found. ? ?Assessment/Plan ?Active Problems: ?  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  Aspiration pneumonia ([place]) ?  Paroxysmal A-fib ([place]) ?  Tracheal stenosis ?  Acute respiratory failure with hypoxia ([place]) ? ? ?Acute on chronic respiratory failure with hypoxia, continue with T-piece, titrate oxygen as tolerated, continue with secretion management and supportive care ?Aspiration pneumonia,has been treated with antibiotics ?Nontraumatic thalamic stroke, continue supportive care and therapy as tolerated ?Paroxysmal atrial fib, currently rate controlled ?Tracheal stenosis, continue supportive care.  Patient has not tolerated capping to date.  Patient will need post discharge ENT evaluation ? ? ?I have personally seen and evaluated the patient, evaluated laboratory and imaging results, formulated the  assessment and plan and placed orders. ?The Patient requires high complexity decision making with multiple systems involvement.  ?Rounds were done with the Respiratory Therapy Director and Staff therapists and discussed with nursing staff also. ? ?Allyne Gee, MD FCCP ?Pulmonary Critical Care Medicine ?Sleep Medicine ? ?

## 2021-11-12 LAB — POTASSIUM: Potassium: 3.7 mmol/L (ref 3.5–5.1)

## 2021-11-12 NOTE — Progress Notes (Signed)
Pulmonary Critical Care Medicine ?[place] ?  ?PULMONARY CRITICAL CARE SERVICE ? ?PROGRESS NOTE ? ? ? ? ?[name]  ?[id number]: 03-14-1961  ? ?DOA: 10/16/2021 ? ?Referring Physician: Satira Sark, MD ? ?HPI: Drazen Eckert is a 61 y.o. male being followed for ventilator/airway/oxygen weaning Acute on Chronic Respiratory Failure.  Patient is off the ventilator on T collar will be using the PMV not able to tolerate capping ? ?Medications: ?Reviewed on Rounds ? ?Physical Exam: ? ?Vitals: Temperature is 97.1 pulse 64 respiratory rate is 13 blood pressure 115/60 saturations 97% ? ?Ventilator Settings on T collar FiO2 is 28% ? ?General: Comfortable at this time ?Neck: supple ?Cardiovascular: no malignant arrhythmias ?Respiratory: Scattered rhonchi expansion is equal ?Skin: no rash seen on limited exam ?Musculoskeletal: No gross abnormality ?Psychiatric:unable to assess ?Neurologic:no involuntary movements   ?   ?   ?Lab Data:  ? ?Basic Metabolic Panel: ?Recent Labs  ?Lab 11/06/21 ?[id number] 11/08/21 ?0358 11/09/21 ?0618 11/11/21 ?[id number] 11/11/21 ?1545 11/12/21 ?0450  ?NA 146* 141  --  138  --   --   ?K 3.7 3.1* 3.5 3.4* 3.8 3.7  ?CL 106 100  --  100  --   --   ?CO2 30 30  --  31  --   --   ?GLUCOSE 155* 201*  --  126*  --   --   ?BUN 38* 31*  --  31*  --   --   ?CREATININE 1.14 1.13  --  0.95  --   --   ?CALCIUM 9.5 8.9  --  8.9  --   --   ?MG 2.5*  --   --  2.3  --   --   ?PHOS  --  3.4  --  4.4  --   --   ? ? ?ABG: ?No results for input(s): PHART, PCO2ART, PO2ART, HCO3, O2SAT in the last 168 hours. ? ?Liver Function Tests: ?Recent Labs  ?Lab 11/06/21 ?[id number] 11/08/21 ?0358 11/11/21 ?0650  ?AST 22  --   --   ?ALT 39  --   --   ?ALKPHOS 116  --   --   ?BILITOT 0.3  --   --   ?PROT 6.1*  --   --   ?ALBUMIN 3.2* 2.9* 2.9*  ? ?No results for input(s): LIPASE, AMYLASE in the last 168 hours. ?No results for input(s): AMMONIA in the last 168 hours. ? ?CBC: ?Recent Labs  ?Lab 11/06/21 ?[id number]  11/11/21 ?0650  ?WBC 8.1 8.7  ?HGB 11.1* 10.7*  ?HCT 34.1* 31.4*  ?MCV 92.9 90.0  ?PLT 313 252  ? ? ?Cardiac Enzymes: ?No results for input(s): CKTOTAL, CKMB, CKMBINDEX, TROPONINI in the last 168 hours. ? ?BNP (last 3 results) ?No results for input(s): BNP in the last 8760 hours. ? ?ProBNP (last 3 results) ?No results for input(s): PROBNP in the last 8760 hours. ? ?Radiological Exams: ?No results found. ? ?Assessment/Plan ?Active Problems: ?  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  Aspiration pneumonia ([place]) ?  Paroxysmal A-fib ([place]) ?  Tracheal stenosis ?  Acute respiratory failure with hypoxia ([place]) ? ? ?Acute on chronic respiratory failure hypoxia we will continue with the T collar titrate oxygen as tolerated continue pulmonary toilet. ?Aspiration pneumonia has been treated with antibiotics ?Paroxysmal atrial fibrillation rate is controlled ?Tracheal stenosis no change ?Nontraumatic intracranial hemorrhage supportive care ? ? ?I have personally seen and evaluated the patient, evaluated laboratory and imaging results, formulated  the assessment and plan and placed orders. ?The Patient requires high complexity decision making with multiple systems involvement.  ?Rounds were done with the Respiratory Therapy Director and Staff therapists and discussed with nursing staff also. ? ?Allyne Gee, MD FCCP ?Pulmonary Critical Care Medicine ?Sleep Medicine ? ?

## 2021-11-13 NOTE — Progress Notes (Signed)
Pulmonary Critical Care Medicine ?[facility] [place] ?  ?PULMONARY CRITICAL CARE SERVICE ? ?PROGRESS NOTE ? ? ? ? ?Caleb Vaughan  ?[id number]  ?DOB: July 17, 1961  ? ?DOA: 10/16/2021 ? ?Referring Physician: Luna Kitchens, MD ? ?HPI: Caleb Vaughan is a 61 y.o. male being followed for ventilator/airway/oxygen weaning Acute on Chronic Respiratory Failure.  Patient is on T collar on 28% FiO2 was attempted on capping not tolerating.  Patient is at baseline ? ?Medications: ?Reviewed on Rounds ? ?Physical Exam: ? ?Vitals: Temperature is 97.5 pulse 63 respiratory rate is 21 blood pressure is 115/70 saturations 97% ? ?Ventilator Settings on T collar FiO2 is 28% ? ?General: Comfortable at this time ?Neck: supple ?Cardiovascular: no malignant arrhythmias ?Respiratory: No rhonchi very coarse breath sounds ?Skin: no rash seen on limited exam ?Musculoskeletal: No gross abnormality ?Psychiatric:unable to assess ?Neurologic:no involuntary movements   ?   ?   ?Lab Data:  ? ?Basic Metabolic Panel: ?Recent Labs  ?Lab 11/08/21 ?0358 11/09/21 ?0618 11/11/21 ?0650 11/11/21 ?1545 11/12/21 ?0450  ?NA 141  --  138  --   --   ?K 3.1* 3.5 3.4* 3.8 3.7  ?CL 100  --  100  --   --   ?CO2 30  --  31  --   --   ?GLUCOSE 201*  --  126*  --   --   ?BUN 31*  --  31*  --   --   ?CREATININE 1.13  --  0.95  --   --   ?CALCIUM 8.9  --  8.9  --   --   ?MG  --   --  2.3  --   --   ?PHOS 3.4  --  4.4  --   --   ? ? ?ABG: ?No results for input(s): PHART, PCO2ART, PO2ART, HCO3, O2SAT in the last 168 hours. ? ?Liver Function Tests: ?Recent Labs  ?Lab 11/08/21 ?0358 11/11/21 ?0650  ?ALBUMIN 2.9* 2.9*  ? ?No results for input(s): LIPASE, AMYLASE in the last 168 hours. ?No results for input(s): AMMONIA in the last 168 hours. ? ?CBC: ?Recent Labs  ?Lab 11/11/21 ?0650  ?WBC 8.7  ?HGB 10.7*  ?HCT 31.4*  ?MCV 90.0  ?PLT 252  ? ? ?Cardiac Enzymes: ?No results for input(s): CKTOTAL, CKMB, CKMBINDEX, TROPONINI in the last 168 hours. ? ?BNP (last 3  results) ?No results for input(s): BNP in the last 8760 hours. ? ?ProBNP (last 3 results) ?No results for input(s): PROBNP in the last 8760 hours. ? ?Radiological Exams: ?No results found. ? ?Assessment/Plan ?Active Problems: ?  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  Aspiration pneumonia (HCC) ?  Paroxysmal A-fib (HCC) ?  Tracheal stenosis ?  Acute respiratory failure with hypoxia (HCC) ? ? ?Acute on chronic respiratory failure hypoxia plan is to continue with the T collar at baseline.  Patient has not been able to tolerate capping trials.  I have spoken with the wife explained to her that he is not at a stage where he can proceed with capping and will need ENT follow-up ?Paroxysmal atrial fibrillation rate is controlled at this time we will continue to follow along closely. ?Aspiration pneumonia patient has been active and remains at risk for aspiration ?Tracheal stenosis will need follow-up after discharge with ENT ?Nontraumatic thalamic hemorrhage we will continue with supportive care there is been no significant improvement ? ? ?I have personally seen and evaluated the patient, evaluated laboratory and imaging results, formulated the assessment  and plan and placed orders. ?The Patient requires high complexity decision making with multiple systems involvement.  ?Rounds were done with the Respiratory Therapy Director and Staff therapists and discussed with nursing staff also. ? ?Allyne Gee, MD FCCP ?Pulmonary Critical Care Medicine ?Sleep Medicine ? ?

## 2021-11-14 NOTE — Progress Notes (Signed)
Pulmonary Critical Care Medicine ?[facility] [place] ?  ?PULMONARY CRITICAL CARE SERVICE ? ?PROGRESS NOTE ? ? ? ? ?Caleb Vaughan  ?[id number]  ?DOB: [birth year]  ? ?DOA: 10/16/2021 ? ?Referring Physician: Luna Kitchens, MD ? ?HPI: Caleb Vaughan is a 61 y.o. male being followed for ventilator/airway/oxygen weaning Acute on Chronic Respiratory Failure.  Patient currently is on T collar has been on 28% FiO2 with saturations that are acceptable. ? ?Medications: ?Reviewed on Rounds ? ?Physical Exam: ? ?Vitals: Temperature 98.0 pulse 76 respiratory is 20 blood pressure is 137/73 saturations 97% ? ?Ventilator Settings on T collar FiO2 28% ? ?General: Comfortable at this time ?Neck: supple ?Cardiovascular: no malignant arrhythmias ?Respiratory: No rhonchi very coarse breath sounds ?Skin: no rash seen on limited exam ?Musculoskeletal: No gross abnormality ?Psychiatric:unable to assess ?Neurologic:no involuntary movements   ?   ?   ?Lab Data:  ? ?Basic Metabolic Panel: ?Recent Labs  ?Lab 11/08/21 ?0358 11/09/21 ?0618 11/11/21 ?0650 11/11/21 ?1545 11/12/21 ?0450  ?NA 141  --  138  --   --   ?K 3.1* 3.5 3.4* 3.8 3.7  ?CL 100  --  100  --   --   ?CO2 30  --  31  --   --   ?GLUCOSE 201*  --  126*  --   --   ?BUN 31*  --  31*  --   --   ?CREATININE 1.13  --  0.95  --   --   ?CALCIUM 8.9  --  8.9  --   --   ?MG  --   --  2.3  --   --   ?PHOS 3.4  --  4.4  --   --   ? ? ?ABG: ?No results for input(s): PHART, PCO2ART, PO2ART, HCO3, O2SAT in the last 168 hours. ? ?Liver Function Tests: ?Recent Labs  ?Lab 11/08/21 ?0358 11/11/21 ?0650  ?ALBUMIN 2.9* 2.9*  ? ?No results for input(s): LIPASE, AMYLASE in the last 168 hours. ?No results for input(s): AMMONIA in the last 168 hours. ? ?CBC: ?Recent Labs  ?Lab 11/11/21 ?0650  ?WBC 8.7  ?HGB 10.7*  ?HCT 31.4*  ?MCV 90.0  ?PLT 252  ? ? ?Cardiac Enzymes: ?No results for input(s): CKTOTAL, CKMB, CKMBINDEX, TROPONINI in the last 168 hours. ? ?BNP (last 3 results) ?No results for  input(s): BNP in the last 8760 hours. ? ?ProBNP (last 3 results) ?No results for input(s): PROBNP in the last 8760 hours. ? ?Radiological Exams: ?No results found. ? ?Assessment/Plan ?Active Problems: ?  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  Aspiration pneumonia (HCC) ?  Paroxysmal A-fib (HCC) ?  Tracheal stenosis ?  Acute respiratory failure with hypoxia (HCC) ? ? ?Acute on chronic respiratory failure with hypoxia plan is going to continue with the T collar.  Patient has not been able to tolerate capping will need evaluation by ENT after discharge ?Aspiration pneumonia patient remains at risk for aspiration we will continue with precautions ?Paroxysmal atrial fibrillation rate is controlled at this time we will continue to follow along ?Nontraumatic thalamic stroke supportive care prognosis remains guarded ?Tracheal stenosis follow-up as outpatient with ENT ? ? ?I have personally seen and evaluated the patient, evaluated laboratory and imaging results, formulated the assessment and plan and placed orders. ?The Patient requires high complexity decision making with multiple systems involvement.  ?Rounds were done with the Respiratory Therapy Director and Staff therapists and discussed with nursing staff also. ? ?Curtistine Pettitt A  Humphrey Rolls, MD FCCP ?Pulmonary Critical Care Medicine ?Sleep Medicine ? ?

## 2021-11-15 NOTE — Progress Notes (Signed)
Pulmonary Critical Care Medicine ?[place] ?  ?PULMONARY CRITICAL CARE SERVICE ? ?PROGRESS NOTE ? ? ? ? ?Caleb Vaughan  ?[id number]: [birth year]  ? ?DOA: 10/16/2021 ? ?Referring Physician: Satira Sark, MD ? ?HPI: Caleb Vaughan is a 61 y.o. male being followed for ventilator/airway/oxygen weaning Acute on Chronic Respiratory Failure.  Patient is resting comfortably without distress on T collar.  Patient's appears to be at baseline ? ?Medications: ?Reviewed on Rounds ? ?Physical Exam: ? ?Vitals: Temperature is 97.6 pulse 64 respiratory is 14 blood pressure is 106/65 saturations 97% ? ?Ventilator Settings off the ventilator on T: ? ?General: Comfortable at this time ?Neck: supple ?Cardiovascular: no malignant arrhythmias ?Respiratory: Scattered rhonchi expansion is equal ?Skin: no rash seen on limited exam ?Musculoskeletal: No gross abnormality ?Psychiatric:unable to assess ?Neurologic:no involuntary movements   ?   ?   ?Lab Data:  ? ?Basic Metabolic Panel: ?Recent Labs  ?Lab 11/09/21 ?0618 11/11/21 ?0650 11/11/21 ?1545 11/12/21 ?0450  ?NA  --  138  --   --   ?K 3.5 3.4* 3.8 3.7  ?CL  --  100  --   --   ?CO2  --  31  --   --   ?GLUCOSE  --  126*  --   --   ?BUN  --  31*  --   --   ?CREATININE  --  0.95  --   --   ?CALCIUM  --  8.9  --   --   ?MG  --  2.3  --   --   ?PHOS  --  4.4  --   --   ? ? ?ABG: ?No results for input(s): PHART, PCO2ART, PO2ART, HCO3, O2SAT in the last 168 hours. ? ?Liver Function Tests: ?Recent Labs  ?Lab 11/11/21 ?[id number]  ?ALBUMIN 2.9*  ? ?No results for input(s): LIPASE, AMYLASE in the last 168 hours. ?No results for input(s): AMMONIA in the last 168 hours. ? ?CBC: ?Recent Labs  ?Lab 11/11/21 ?[id number]  ?WBC 8.7  ?HGB 10.7*  ?HCT 31.4*  ?MCV 90.0  ?PLT 252  ? ? ?Cardiac Enzymes: ?No results for input(s): CKTOTAL, CKMB, CKMBINDEX, TROPONINI in the last 168 hours. ? ?BNP (last 3 results) ?No results for input(s): BNP in the last 8760 hours. ? ?ProBNP (last 3  results) ?No results for input(s): PROBNP in the last 8760 hours. ? ?Radiological Exams: ?No results found. ? ?Assessment/Plan ?Active Problems: ?  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  Aspiration pneumonia ([place]) ?  Paroxysmal A-fib ([place]) ?  Tracheal stenosis ?  Acute respiratory failure with hypoxia ([place]) ? ? ?Acute on chronic respiratory failure with hypoxia we will continue with T collar titrate oxygen as tolerated continue secretion management pulmonary toilet. ?Aspiration pneumonia has been treated we will continue to follow along closely. ?Paroxysmal atrial fibrillation rate is controlled at this time ?Tracheal stenosis no change noted overall ?Nontraumatic thalamic stroke supportive care ? ? ?I have personally seen and evaluated the patient, evaluated laboratory and imaging results, formulated the assessment and plan and placed orders. ?The Patient requires high complexity decision making with multiple systems involvement.  ?Rounds were done with the Respiratory Therapy Director and Staff therapists and discussed with nursing staff also. ? ?Allyne Gee, MD FCCP ?Pulmonary Critical Care Medicine ?Sleep Medicine ? ?

## 2021-11-16 NOTE — Progress Notes (Signed)
Pulmonary Critical Care Medicine ?[facility] [place] ?  ?PULMONARY CRITICAL CARE SERVICE ? ?PROGRESS NOTE ? ? ? ? ?Caleb Vaughan  ?[id number]  ?DOB: 1961/07/27  ? ?DOA: 10/16/2021 ? ?Referring Physician: Luna Kitchens, MD ? ?HPI: Caleb Vaughan is a 61 y.o. male being followed for ventilator/airway/oxygen weaning Acute on Chronic Respiratory Failure.  Patient is on T collar currently on 28% FiO2 with PMV patient is at baseline ? ?Medications: ?Reviewed on Rounds ? ?Physical Exam: ? ?Vitals: Temperature is 97.4 pulse 75 respiratory rate is 33 blood pressure 145/76 saturations 97% ? ?Ventilator Settings on T collar FiO2 is 28% ? ?General: Comfortable at this time ?Neck: supple ?Cardiovascular: no malignant arrhythmias ?Respiratory: No rhonchi very coarse breath sounds ?Skin: no rash seen on limited exam ?Musculoskeletal: No gross abnormality ?Psychiatric:unable to assess ?Neurologic:no involuntary movements   ?   ?   ?Lab Data:  ? ?Basic Metabolic Panel: ?Recent Labs  ?Lab 11/11/21 ?0650 11/11/21 ?1545 11/12/21 ?0450  ?NA 138  --   --   ?K 3.4* 3.8 3.7  ?CL 100  --   --   ?CO2 31  --   --   ?GLUCOSE 126*  --   --   ?BUN 31*  --   --   ?CREATININE 0.95  --   --   ?CALCIUM 8.9  --   --   ?MG 2.3  --   --   ?PHOS 4.4  --   --   ? ? ?ABG: ?No results for input(s): PHART, PCO2ART, PO2ART, HCO3, O2SAT in the last 168 hours. ? ?Liver Function Tests: ?Recent Labs  ?Lab 11/11/21 ?0650  ?ALBUMIN 2.9*  ? ?No results for input(s): LIPASE, AMYLASE in the last 168 hours. ?No results for input(s): AMMONIA in the last 168 hours. ? ?CBC: ?Recent Labs  ?Lab 11/11/21 ?0650  ?WBC 8.7  ?HGB 10.7*  ?HCT 31.4*  ?MCV 90.0  ?PLT 252  ? ? ?Cardiac Enzymes: ?No results for input(s): CKTOTAL, CKMB, CKMBINDEX, TROPONINI in the last 168 hours. ? ?BNP (last 3 results) ?No results for input(s): BNP in the last 8760 hours. ? ?ProBNP (last 3 results) ?No results for input(s): PROBNP in the last 8760 hours. ? ?Radiological Exams: ?No  results found. ? ?Assessment/Plan ?Active Problems: ?  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  Aspiration pneumonia (HCC) ?  Paroxysmal A-fib (HCC) ?  Tracheal stenosis ?  Acute respiratory failure with hypoxia (HCC) ? ? ?Acute on chronic respiratory failure hypoxia we will continue with the T collar patient is at baseline due to tracheal stenosis not able to tolerate any further weaning ?Aspiration pneumonia has been treated with antibiotics we will continue to monitor and follow along closely. ?Paroxysmal atrial fibrillation rate is controlled at this time ?Tracheal stenosis supportive care we will follow-up after discharge ?Nontraumatic thalamic hemorrhage and stroke supportive care therapy as tolerated ? ? ?I have personally seen and evaluated the patient, evaluated laboratory and imaging results, formulated the assessment and plan and placed orders. ?The Patient requires high complexity decision making with multiple systems involvement.  ?Rounds were done with the Respiratory Therapy Director and Staff therapists and discussed with nursing staff also. ? ?Yevonne Pax, MD FCCP ?Pulmonary Critical Care Medicine ?Sleep Medicine ? ?

## 2021-11-17 ENCOUNTER — Other Ambulatory Visit (HOSPITAL_COMMUNITY): Payer: Self-pay

## 2021-11-17 NOTE — Progress Notes (Signed)
Pulmonary Critical Care Medicine ?[facility] [place] ?  ?PULMONARY CRITICAL CARE SERVICE ? ?PROGRESS NOTE ? ? ? ? ?Caleb Vaughan  ?[id number]  ?DOB: 1960/12/02  ? ?DOA: 10/16/2021 ? ?Referring Physician: Luna Kitchens, MD ? ?HPI: Caleb Vaughan is a 61 y.o. male being followed for ventilator/airway/oxygen weaning Acute on Chronic Respiratory Failure.  Patient is on T collar currently on 28% FiO2 using the PMV ? ?Medications: ?Reviewed on Rounds ? ?Physical Exam: ? ?Vitals: Temperature is 97.5 pulse 80 respiratory rate is 30 blood pressure 167/75 saturations 98% ? ?Ventilator Settings off the ventilator on T collar 28% FiO2 ? ?General: Comfortable at this time ?Neck: supple ?Cardiovascular: no malignant arrhythmias ?Respiratory: Coarse rhonchi expansion is equal ?Skin: no rash seen on limited exam ?Musculoskeletal: No gross abnormality ?Psychiatric:unable to assess ?Neurologic:no involuntary movements   ?   ?   ?Lab Data:  ? ?Basic Metabolic Panel: ?Recent Labs  ?Lab 11/11/21 ?0650 11/11/21 ?1545 11/12/21 ?0450  ?NA 138  --   --   ?K 3.4* 3.8 3.7  ?CL 100  --   --   ?CO2 31  --   --   ?GLUCOSE 126*  --   --   ?BUN 31*  --   --   ?CREATININE 0.95  --   --   ?CALCIUM 8.9  --   --   ?MG 2.3  --   --   ?PHOS 4.4  --   --   ? ? ?ABG: ?No results for input(s): PHART, PCO2ART, PO2ART, HCO3, O2SAT in the last 168 hours. ? ?Liver Function Tests: ?Recent Labs  ?Lab 11/11/21 ?0650  ?ALBUMIN 2.9*  ? ?No results for input(s): LIPASE, AMYLASE in the last 168 hours. ?No results for input(s): AMMONIA in the last 168 hours. ? ?CBC: ?Recent Labs  ?Lab 11/11/21 ?0650  ?WBC 8.7  ?HGB 10.7*  ?HCT 31.4*  ?MCV 90.0  ?PLT 252  ? ? ?Cardiac Enzymes: ?No results for input(s): CKTOTAL, CKMB, CKMBINDEX, TROPONINI in the last 168 hours. ? ?BNP (last 3 results) ?No results for input(s): BNP in the last 8760 hours. ? ?ProBNP (last 3 results) ?No results for input(s): PROBNP in the last 8760 hours. ? ?Radiological Exams: ?No  results found. ? ?Assessment/Plan ?Active Problems: ?  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  Aspiration pneumonia (HCC) ?  Paroxysmal A-fib (HCC) ?  Tracheal stenosis ?  Acute respiratory failure with hypoxia (HCC) ? ? ?Acute on chronic respiratory failure with hypoxia patient has not been able to tolerate capping patient has not been able to tolerate downsize however patient is able to tolerate the PMV.  I spoke with the patient's family members at the bedside explained to them that with a diagnosis of tracheal stenosis this is likely the reason why the patient is having issues with being able to tolerate capping.  They were requesting an ENT consultation unfortunately we do not have ENT available at this facility.  I spoke with primary team we will try to see if we can get an ENT consultation in the emergency room if this is feasible ?Aspiration pneumonia has been treated with antibiotics we will continue to follow along closely. ?Paroxysmal atrial fibrillation right now rate is controlled we will continue with supportive care ?Tracheal stenosis no change we will continue with present management ?Nontraumatic thalamic hemorrhage supportive care prognosis is guarded ? ? ?I have personally seen and evaluated the patient, evaluated laboratory and imaging results, formulated the assessment and plan  and placed orders. ?The Patient requires high complexity decision making with multiple systems involvement.  ?Rounds were done with the Respiratory Therapy Director and Staff therapists and discussed with nursing staff also. ? ?Allyne Gee, MD FCCP ?Pulmonary Critical Care Medicine ?Sleep Medicine ? ?

## 2021-11-18 ENCOUNTER — Other Ambulatory Visit (HOSPITAL_COMMUNITY): Payer: Self-pay

## 2021-11-18 DIAGNOSIS — Z7901 Long term (current) use of anticoagulants: Secondary | ICD-10-CM

## 2021-11-18 DIAGNOSIS — I639 Cerebral infarction, unspecified: Secondary | ICD-10-CM

## 2021-11-18 LAB — CBC
HCT: 34.8 % — ABNORMAL LOW (ref 39.0–52.0)
Hemoglobin: 11.3 g/dL — ABNORMAL LOW (ref 13.0–17.0)
MCH: 30.4 pg (ref 26.0–34.0)
MCHC: 32.5 g/dL (ref 30.0–36.0)
MCV: 93.5 fL (ref 80.0–100.0)
Platelets: 237 10*3/uL (ref 150–400)
RBC: 3.72 MIL/uL — ABNORMAL LOW (ref 4.22–5.81)
RDW: 13.1 % (ref 11.5–15.5)
WBC: 12.8 10*3/uL — ABNORMAL HIGH (ref 4.0–10.5)
nRBC: 0 % (ref 0.0–0.2)

## 2021-11-18 LAB — URINALYSIS, ROUTINE W REFLEX MICROSCOPIC
Bilirubin Urine: NEGATIVE
Glucose, UA: 150 mg/dL — AB
Hgb urine dipstick: NEGATIVE
Ketones, ur: NEGATIVE mg/dL
Nitrite: NEGATIVE
Protein, ur: 100 mg/dL — AB
Specific Gravity, Urine: 1.016 (ref 1.005–1.030)
WBC, UA: >50 WBC/hpf — ABNORMAL HIGH (ref 0–5)
pH: 6 (ref 5.0–8.0)

## 2021-11-18 LAB — MAGNESIUM: Magnesium: 2.6 mg/dL — ABNORMAL HIGH (ref 1.7–2.4)

## 2021-11-18 LAB — BASIC METABOLIC PANEL
Anion gap: 7 (ref 5–15)
BUN: 30 mg/dL — ABNORMAL HIGH (ref 6–20)
CO2: 36 mmol/L — ABNORMAL HIGH (ref 22–32)
Calcium: 10 mg/dL (ref 8.9–10.3)
Chloride: 103 mmol/L (ref 98–111)
Creatinine, Ser: 1.23 mg/dL (ref 0.61–1.24)
GFR, Estimated: >60 mL/min (ref >60)
Glucose, Bld: 134 mg/dL — ABNORMAL HIGH (ref 70–99)
Potassium: 3.9 mmol/L (ref 3.5–5.1)
Sodium: 146 mmol/L — ABNORMAL HIGH (ref 135–145)

## 2021-11-18 NOTE — Progress Notes (Signed)
Pulmonary Critical Care Medicine ?[place] ?  ?PULMONARY CRITICAL CARE SERVICE ? ?PROGRESS NOTE ? ? ? ? ?Caleb Vaughan  ?[id number]: [birth year]  ? ?DOA: 10/16/2021 ? ?Referring Physician: Satira Sark, MD ? ?HPI: Caleb Vaughan is a 61 y.o. male being followed for ventilator/airway/oxygen weaning Acute on Chronic Respiratory Failure.  Patient is off the ventilator on T collar has been on 28% FiO2.  Primary care team spoke with ENT yesterday they do not have anything more that they can offer apparently in the hospital ? ?Medications: ?Reviewed on Rounds ? ?Physical Exam: ? ?Vitals: Temperature is 97.4 pulse 68 respiratory rate is 23 blood pressure is 129/65 saturations 98% ? ?Ventilator Settings on T collar FiO2 is 28% ? ?General: Comfortable at this time ?Neck: supple ?Cardiovascular: no malignant arrhythmias ?Respiratory: Scattered rhonchi expansion is equal ?Skin: no rash seen on limited exam ?Musculoskeletal: No gross abnormality ?Psychiatric:unable to assess ?Neurologic:no involuntary movements   ?   ?   ?Lab Data:  ? ?Basic Metabolic Panel: ?Recent Labs  ?Lab 11/11/21 ?1545 11/12/21 ?0450 11/18/21 ?[id number]  ?NA  --   --  146*  ?K 3.8 3.7 3.9  ?CL  --   --  103  ?CO2  --   --  36*  ?GLUCOSE  --   --  134*  ?BUN  --   --  30*  ?CREATININE  --   --  1.23  ?CALCIUM  --   --  10.0  ?MG  --   --  2.6*  ? ? ?ABG: ?No results for input(s): PHART, PCO2ART, PO2ART, HCO3, O2SAT in the last 168 hours. ? ?Liver Function Tests: ?No results for input(s): AST, ALT, ALKPHOS, BILITOT, PROT, ALBUMIN in the last 168 hours. ?No results for input(s): LIPASE, AMYLASE in the last 168 hours. ?No results for input(s): AMMONIA in the last 168 hours. ? ?CBC: ?Recent Labs  ?Lab 11/18/21 ?0616  ?WBC 12.8*  ?HGB 11.3*  ?HCT 34.8*  ?MCV 93.5  ?PLT 237  ? ? ?Cardiac Enzymes: ?No results for input(s): CKTOTAL, CKMB, CKMBINDEX, TROPONINI in the last 168 hours. ? ?BNP (last 3 results) ?No results for input(s): BNP  in the last 8760 hours. ? ?ProBNP (last 3 results) ?No results for input(s): PROBNP in the last 8760 hours. ? ?Radiological Exams: ?No results found. ? ?Assessment/Plan ?Active Problems: ?  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  Aspiration pneumonia ([place]) ?  Paroxysmal A-fib ([place]) ?  Tracheal stenosis ?  Acute respiratory failure with hypoxia ([place]) ? ? ?Acute on chronic respiratory failure with hypoxia we will continue with the T collar trials currently on 28% FiO2 we will continue with secretion management pulmonary toilet. ?Aspiration pneumonia has been treated with antibiotics ?Paroxysmal atrial fibrillation rate is controlled at this time ?Tracheal stenosis again patient will need follow-up after discharge we will try to see if this can be arranged ?Nontraumatic thalamic stroke no change continue with supportive care ? ? ?I have personally seen and evaluated the patient, evaluated laboratory and imaging results, formulated the assessment and plan and placed orders. ?The Patient requires high complexity decision making with multiple systems involvement.  ?Rounds were done with the Respiratory Therapy Director and Staff therapists and discussed with nursing staff also. ? ?Allyne Gee, MD FCCP ?Pulmonary Critical Care Medicine ?Sleep Medicine ? ?

## 2021-11-19 NOTE — Progress Notes (Addendum)
Pulmonary Critical Care Medicine ?[facility] [place] ?  ?PULMONARY CRITICAL CARE SERVICE ? ?PROGRESS NOTE ? ? ? ? ?[name]  ?[id number]  ?DOB: 1960-12-08  ? ?DOA: 10/16/2021 ? ?Referring Physician: Luna Kitchens, MD ? ?HPI: Leviticus Harton is a 61 y.o. male being followed for ventilator/airway/oxygen weaning Acute on Chronic Respiratory Failure. Patient seen sitting up in bed, currently on tbar 28% with PMV in place. ENT will not be evaluating patient during this visit. No acute distress, no acute over night events ? ?Medications: ?Reviewed on Rounds ? ?Physical Exam: ? ?Vitals: Temp  98.0 , HR 78 , RR 28   , BP 121/67  , SPO2   99% ? ?Ventilator Settings TBAR 28% with PMV in place  ? ?General: Comfortable at this time ?Neck: supple ?Cardiovascular: no malignant arrhythmias ?Respiratory: Bilaterally diminished ?Skin: no rash seen on limited exam ?Musculoskeletal: No gross abnormality ?Psychiatric:unable to assess ?Neurologic:no involuntary movements   ?   ?   ?Lab Data:  ? ?Basic Metabolic Panel: ?Recent Labs  ?Lab 11/18/21 ?0616  ?NA 146*  ?K 3.9  ?CL 103  ?CO2 36*  ?GLUCOSE 134*  ?BUN 30*  ?CREATININE 1.23  ?CALCIUM 10.0  ?MG 2.6*  ? ? ?ABG: ?No results for input(s): PHART, PCO2ART, PO2ART, HCO3, O2SAT in the last 168 hours. ? ?Liver Function Tests: ?No results for input(s): AST, ALT, ALKPHOS, BILITOT, PROT, ALBUMIN in the last 168 hours. ?No results for input(s): LIPASE, AMYLASE in the last 168 hours. ?No results for input(s): AMMONIA in the last 168 hours. ? ?CBC: ?Recent Labs  ?Lab 11/18/21 ?0616  ?WBC 12.8*  ?HGB 11.3*  ?HCT 34.8*  ?MCV 93.5  ?PLT 237  ? ? ?Cardiac Enzymes: ?No results for input(s): CKTOTAL, CKMB, CKMBINDEX, TROPONINI in the last 168 hours. ? ?BNP (last 3 results) ?No results for input(s): BNP in the last 8760 hours. ? ?ProBNP (last 3 results) ?No results for input(s): PROBNP in the last 8760 hours. ? ?Radiological Exams: ?DG CHEST PORT 1 VIEW ? ?Result Date:  11/18/2021 ?CLINICAL DATA:  Leukocytosis. EXAM: PORTABLE CHEST 1 VIEW COMPARISON:  10/25/2021 FINDINGS: Tracheostomy tube extends down the trachea to within 2.5 cm the carina. Mildly enlarged cardiac silhouette. Improved aeration to the lungs compared to prior. Decreased central venous congestion compared to prior. Low lung volumes. IMPRESSION: 1. Improved venous congestion/edema compared to prior chest radiograph. 2. Tracheostomy tube extends into the trachea as above. Electronically Signed   By: Genevive Bi M.D.   On: 11/18/2021 15:12   ? ?Assessment/Plan ?Active Problems: ?  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  Aspiration pneumonia (HCC) ?  Paroxysmal A-fib (HCC) ?  Tracheal stenosis ?  Acute respiratory failure with hypoxia (HCC) ? ?Acute on chronic respiratory failure with hypoxia we will continue with the T collar trials as tolerated ,currently on 28% FiO2 with PMV in place, we will continue with secretion management and pulmonary toilet. ENT evaluation will be needed post discharge. 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 ? ?I have personally seen and evaluated the patient, evaluated laboratory and imaging results, formulated the assessment and plan and placed orders. ?The Patient requires high complexity decision making with multiple systems involvement.  ?Rounds were done with the Respiratory Therapy Director and Staff therapists and  discussed with nursing staff also. ? ?Yevonne Pax, MD FCCP ?Pulmonary Critical Care Medicine ?Sleep Medicine  ?

## 2021-11-20 LAB — RENAL FUNCTION PANEL
Albumin: 3.2 g/dL — ABNORMAL LOW (ref 3.5–5.0)
Anion gap: 11 (ref 5–15)
BUN: 35 mg/dL — ABNORMAL HIGH (ref 6–20)
CO2: 28 mmol/L (ref 22–32)
Calcium: 9.6 mg/dL (ref 8.9–10.3)
Chloride: 107 mmol/L (ref 98–111)
Creatinine, Ser: 1.07 mg/dL (ref 0.61–1.24)
GFR, Estimated: >60 mL/min (ref >60)
Glucose, Bld: 103 mg/dL — ABNORMAL HIGH (ref 70–99)
Phosphorus: 4.3 mg/dL (ref 2.5–4.6)
Potassium: 4 mmol/L (ref 3.5–5.1)
Sodium: 146 mmol/L — ABNORMAL HIGH (ref 135–145)

## 2021-11-20 LAB — CULTURE, RESPIRATORY W GRAM STAIN: Culture: NORMAL

## 2021-11-20 NOTE — Progress Notes (Addendum)
Pulmonary Critical Care Medicine ?[place] ?  ?PULMONARY CRITICAL CARE SERVICE ? ?PROGRESS NOTE ? ? ? ? ?Caleb Vaughan  ?[id number]: 1961/01/08  ? ?DOA: 10/16/2021 ? ?Referring Physician: Satira Sark, MD ? ?HPI: Caleb Vaughan is a 61 y.o. male being followed for ventilator/airway/oxygen weaning Acute on Chronic Respiratory Failure. Patient seen sitting up in bed, currently on tbar 28% with PMV in place. ENT will not be evaluating patient during this visit. No acute distress, no acute over night events.  ? ?Medications: ?Reviewed on Rounds ? ?Physical Exam: ? ?Vitals: temp 97.6, pulse 79, RR 30, BP 159/85, SPO2 96% ? ?Ventilator Settings ATC 28% with PMV in place ? ?General: Comfortable at this time ?Neck: supple ?Cardiovascular: no malignant arrhythmias ?Respiratory: Bilaterally course on inspiration and expiration ?Skin: no rash seen on limited exam ?Musculoskeletal: No gross abnormality ?Psychiatric:unable to assess ?Neurologic:no involuntary movements   ?   ?   ?Lab Data:  ? ?Basic Metabolic Panel: ?Recent Labs  ?Lab 11/18/21 ?0616 11/20/21 ?0430  ?NA 146* 146*  ?K 3.9 4.0  ?CL 103 107  ?CO2 36* 28  ?GLUCOSE 134* 103*  ?BUN 30* 35*  ?CREATININE 1.23 1.07  ?CALCIUM 10.0 9.6  ?MG 2.6*  --   ?PHOS  --  4.3  ? ? ?ABG: ?No results for input(s): PHART, PCO2ART, PO2ART, HCO3, O2SAT in the last 168 hours. ? ?Liver Function Tests: ?Recent Labs  ?Lab 11/20/21 ?0430  ?ALBUMIN 3.2*  ? ?No results for input(s): LIPASE, AMYLASE in the last 168 hours. ?No results for input(s): AMMONIA in the last 168 hours. ? ?CBC: ?Recent Labs  ?Lab 11/18/21 ?0616  ?WBC 12.8*  ?HGB 11.3*  ?HCT 34.8*  ?MCV 93.5  ?PLT 237  ? ? ?Cardiac Enzymes: ?No results for input(s): CKTOTAL, CKMB, CKMBINDEX, TROPONINI in the last 168 hours. ? ?BNP (last 3 results) ?No results for input(s): BNP in the last 8760 hours. ? ?ProBNP (last 3 results) ?No results for input(s): PROBNP in the last 8760 hours. ? ?Radiological  Exams: ?DG CHEST PORT 1 VIEW ? ?Result Date: 11/18/2021 ?CLINICAL DATA:  Leukocytosis. EXAM: PORTABLE CHEST 1 VIEW COMPARISON:  10/25/2021 FINDINGS: Tracheostomy tube extends down the trachea to within 2.5 cm the carina. Mildly enlarged cardiac silhouette. Improved aeration to the lungs compared to prior. Decreased central venous congestion compared to prior. Low lung volumes. IMPRESSION: 1. Improved venous congestion/edema compared to prior chest radiograph. 2. Tracheostomy tube extends into the trachea as above. Electronically Signed   By: Suzy Bouchard M.D.   On: 11/18/2021 15:12   ? ?Assessment/Plan ?Active Problems: ?  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  Aspiration pneumonia ([place]) ?  Paroxysmal A-fib ([place]) ?  Tracheal stenosis ?  Acute respiratory failure with hypoxia ([place]) ? ?Acute on chronic respiratory failure with hypoxia- we will continue with the T collar trials as tolerated ,currently on 28% FiO2 with PMV in place, we will continue with secretion management and pulmonary toilet. ENT evaluation will be needed post discharge. 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 ?I have personally seen and evaluated the patient, evaluated laboratory and imaging results, formulated the assessment and plan and placed orders. ?The Patient requires high complexity decision making with multiple systems involvement.  ?Rounds were done with  the Respiratory Therapy Director and Staff therapists and discussed with nursing staff also. ? ?Allyne Gee, MD FCCP ?Pulmonary Critical Care Medicine ?Sleep Medicine  ?

## 2021-11-21 LAB — URINE CULTURE: Culture: 10000 — AB

## 2021-11-21 NOTE — Progress Notes (Addendum)
Pulmonary Critical Care Medicine ?[place] ?  ?PULMONARY CRITICAL CARE SERVICE ? ?PROGRESS NOTE ? ? ? ? ?Caleb Vaughan  ?[id number]: 1961/04/20  ? ?DOA: 10/16/2021 ? ?Referring Physician: Satira Sark, MD ? ?HPI: Caleb Vaughan is a 61 y.o. male being followed for ventilator/airway/oxygen weaning Acute on Chronic Respiratory Failure. Patient seen sitting in bed. ATC 28% with PMV in place. No acute distress, no acute overnight events. ? ?Medications: ?Reviewed on Rounds ? ?Physical Exam: ? ?Vitals: temp 97.3, pulse, 80, RR 26, BP 162/83, SPO2 98% ? ?Ventilator Settings ATC 28% with PMV ? ?General: Comfortable at this time ?Neck: supple ?Cardiovascular: no malignant arrhythmias ?Respiratory: Bilaterally clear on inspiration, Right lung with rhonchi on expiration  ?Skin: no rash seen on limited exam ?Musculoskeletal: No gross abnormality ?Psychiatric:unable to assess ?Neurologic:no involuntary movements   ?   ?   ?Lab Data:  ? ?Basic Metabolic Panel: ?Recent Labs  ?Lab 11/18/21 ?0616 11/20/21 ?0430  ?NA 146* 146*  ?K 3.9 4.0  ?CL 103 107  ?CO2 36* 28  ?GLUCOSE 134* 103*  ?BUN 30* 35*  ?CREATININE 1.23 1.07  ?CALCIUM 10.0 9.6  ?MG 2.6*  --   ?PHOS  --  4.3  ? ? ?ABG: ?No results for input(s): PHART, PCO2ART, PO2ART, HCO3, O2SAT in the last 168 hours. ? ?Liver Function Tests: ?Recent Labs  ?Lab 11/20/21 ?0430  ?ALBUMIN 3.2*  ? ?No results for input(s): LIPASE, AMYLASE in the last 168 hours. ?No results for input(s): AMMONIA in the last 168 hours. ? ?CBC: ?Recent Labs  ?Lab 11/18/21 ?0616  ?WBC 12.8*  ?HGB 11.3*  ?HCT 34.8*  ?MCV 93.5  ?PLT 237  ? ? ?Cardiac Enzymes: ?No results for input(s): CKTOTAL, CKMB, CKMBINDEX, TROPONINI in the last 168 hours. ? ?BNP (last 3 results) ?No results for input(s): BNP in the last 8760 hours. ? ?ProBNP (last 3 results) ?No results for input(s): PROBNP in the last 8760 hours. ? ?Radiological Exams: ?No results found. ? ?Assessment/Plan ?Active  Problems: ?  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  Aspiration pneumonia ([place]) ?  Paroxysmal A-fib ([place]) ?  Tracheal stenosis ?  Acute respiratory failure with hypoxia ([place]) ? ?Acute on chronic respiratory failure with hypoxia- we will continue with the T collar trials as tolerated ,currently on 28% FiO2 with PMV in place, we will continue with secretion management and pulmonary toilet. ENT evaluation will be needed post discharge. 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 ?I have personally seen and evaluated the patient, evaluated laboratory and imaging results, formulated the assessment and plan and placed orders. ?The Patient requires high complexity decision making with multiple systems involvement.  ?Rounds were done with the Respiratory Therapy Director and Staff therapists and discussed with nursing staff also. ? ?Allyne Gee, MD FCCP ?Pulmonary Critical Care Medicine ?Sleep Medicine  ?

## 2021-11-22 LAB — RENAL FUNCTION PANEL
Albumin: 3.3 g/dL — ABNORMAL LOW (ref 3.5–5.0)
Anion gap: 7 (ref 5–15)
BUN: 33 mg/dL — ABNORMAL HIGH (ref 6–20)
CO2: 32 mmol/L (ref 22–32)
Calcium: 9.5 mg/dL (ref 8.9–10.3)
Chloride: 105 mmol/L (ref 98–111)
Creatinine, Ser: 0.95 mg/dL (ref 0.61–1.24)
GFR, Estimated: >60 mL/min (ref >60)
Glucose, Bld: 191 mg/dL — ABNORMAL HIGH (ref 70–99)
Phosphorus: 4.1 mg/dL (ref 2.5–4.6)
Potassium: 3.6 mmol/L (ref 3.5–5.1)
Sodium: 144 mmol/L (ref 135–145)

## 2021-11-22 LAB — CBC
HCT: 32.7 % — ABNORMAL LOW (ref 39.0–52.0)
Hemoglobin: 10.9 g/dL — ABNORMAL LOW (ref 13.0–17.0)
MCH: 30.5 pg (ref 26.0–34.0)
MCHC: 33.3 g/dL (ref 30.0–36.0)
MCV: 91.6 fL (ref 80.0–100.0)
Platelets: 214 10*3/uL (ref 150–400)
RBC: 3.57 MIL/uL — ABNORMAL LOW (ref 4.22–5.81)
RDW: 12.5 % (ref 11.5–15.5)
WBC: 8.7 10*3/uL (ref 4.0–10.5)
nRBC: 0 % (ref 0.0–0.2)

## 2021-11-22 LAB — MAGNESIUM: Magnesium: 2.4 mg/dL (ref 1.7–2.4)

## 2021-11-22 NOTE — Progress Notes (Addendum)
Pulmonary Critical Care Medicine ?[place] ?  ?PULMONARY CRITICAL CARE SERVICE ? ?PROGRESS NOTE ? ? ? ? ?Caleb Vaughan  ?[id number]: 06-22-1961  ? ?DOA: 10/16/2021 ? ?Referring Physician: Satira Sark, MD ? ?HPI: Caleb Vaughan is a 61 y.o. male being followed for ventilator/airway/oxygen weaning Acute on Chronic Respiratory Failure. Patient seen sitting in bed. ATC 28% with PMV in place. No acute distress, no acute overnight events. ? ?Medications: ?Reviewed on Rounds ? ?Physical Exam: ? ?Vitals: temp 97.2, pulse 82, RR 20, BP 164/78, SPO2 94% ? ?Ventilator Settings ATC 28% with PMV in place ? ?General: Comfortable at this time ?Neck: supple ?Cardiovascular: no malignant arrhythmias ?Respiratory: Bilaterally diminished in all lobes ?Skin: no rash seen on limited exam ?Musculoskeletal: No gross abnormality ?Psychiatric:unable to assess ?Neurologic:no involuntary movements   ?   ?   ?Lab Data:  ? ?Basic Metabolic Panel: ?Recent Labs  ?Lab 11/18/21 ?0616 11/20/21 ?0430 11/22/21 ?0512  ?NA 146* 146* 144  ?K 3.9 4.0 3.6  ?CL 103 107 105  ?CO2 36* 28 32  ?GLUCOSE 134* 103* 191*  ?BUN 30* 35* 33*  ?CREATININE 1.23 1.07 0.95  ?CALCIUM 10.0 9.6 9.5  ?MG 2.6*  --  2.4  ?PHOS  --  4.3 4.1  ? ? ?ABG: ?No results for input(s): PHART, PCO2ART, PO2ART, HCO3, O2SAT in the last 168 hours. ? ?Liver Function Tests: ?Recent Labs  ?Lab 11/20/21 ?0430 11/22/21 ?0512  ?ALBUMIN 3.2* 3.3*  ? ?No results for input(s): LIPASE, AMYLASE in the last 168 hours. ?No results for input(s): AMMONIA in the last 168 hours. ? ?CBC: ?Recent Labs  ?Lab 11/18/21 ?0616 11/22/21 ?0512  ?WBC 12.8* 8.7  ?HGB 11.3* 10.9*  ?HCT 34.8* 32.7*  ?MCV 93.5 91.6  ?PLT 237 214  ? ? ?Cardiac Enzymes: ?No results for input(s): CKTOTAL, CKMB, CKMBINDEX, TROPONINI in the last 168 hours. ? ?BNP (last 3 results) ?No results for input(s): BNP in the last 8760 hours. ? ?ProBNP (last 3 results) ?No results for input(s): PROBNP in the last 8760  hours. ? ?Radiological Exams: ?No results found. ? ?Assessment/Plan ?Active Problems: ?  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  Aspiration pneumonia ([place]) ?  Paroxysmal A-fib ([place]) ?  Tracheal stenosis ?  Acute respiratory failure with hypoxia ([place]) ? ?Acute on chronic respiratory failure with hypoxia- we will continue with the T collar trials as tolerated ,currently on 28% FiO2 with PMV in place, we will continue with secretion management and pulmonary toilet. ENT evaluation will be needed post discharge. 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 ?I have personally seen and evaluated the patient, evaluated laboratory and imaging results, formulated the assessment and plan and placed orders. ?The Patient requires high complexity decision making with multiple systems involvement.  ?Rounds were done with the Respiratory Therapy Director and Staff therapists and discussed with nursing staff also. ? ?Allyne Gee, MD FCCP ?Pulmonary Critical Care Medicine ?Sleep Medicine  ?

## 2021-11-23 NOTE — Progress Notes (Addendum)
Pulmonary Critical Care Medicine ?[place] ?  ?PULMONARY CRITICAL CARE SERVICE ? ?PROGRESS NOTE ? ? ? ? ?Caleb Vaughan  ?[id number]: [birth year]  ? ?DOA: 10/16/2021 ? ?Referring Physician: Satira Sark, MD ? ?HPI: Caleb Vaughan is a 61 y.o. male being followed for ventilator/airway/oxygen weaning Acute on Chronic Respiratory Failure. Patient seen sitting in bed, he gave me thumbs up. ATC 28% with PMV in place. No acute distress, no acute overnight events. ? ?Medications: ?Reviewed on Rounds ? ?Physical Exam: ? ?Vitals: temp 97.8, pulse 84, RR 25, BP 165/74, SPO2 95% ? ?Ventilator Settings ATC 28% ? ?General: Comfortable at this time ?Neck: supple ?Cardiovascular: no malignant arrhythmias ?Respiratory: Bilaterally clear in all lobes ?Skin: no rash seen on limited exam ?Musculoskeletal: No gross abnormality ?Psychiatric:unable to assess ?Neurologic:no involuntary movements   ?   ?   ?Lab Data:  ? ?Basic Metabolic Panel: ?Recent Labs  ?Lab 11/18/21 ?0616 11/20/21 ?0430 11/22/21 ?0512  ?NA 146* 146* 144  ?K 3.9 4.0 3.6  ?CL 103 107 105  ?CO2 36* 28 32  ?GLUCOSE 134* 103* 191*  ?BUN 30* 35* 33*  ?CREATININE 1.23 1.07 0.95  ?CALCIUM 10.0 9.6 9.5  ?MG 2.6*  --  2.4  ?PHOS  --  4.3 4.1  ? ? ?ABG: ?No results for input(s): PHART, PCO2ART, PO2ART, HCO3, O2SAT in the last 168 hours. ? ?Liver Function Tests: ?Recent Labs  ?Lab 11/20/21 ?0430 11/22/21 ?0512  ?ALBUMIN 3.2* 3.3*  ? ?No results for input(s): LIPASE, AMYLASE in the last 168 hours. ?No results for input(s): AMMONIA in the last 168 hours. ? ?CBC: ?Recent Labs  ?Lab 11/18/21 ?0616 11/22/21 ?0512  ?WBC 12.8* 8.7  ?HGB 11.3* 10.9*  ?HCT 34.8* 32.7*  ?MCV 93.5 91.6  ?PLT 237 214  ? ? ?Cardiac Enzymes: ?No results for input(s): CKTOTAL, CKMB, CKMBINDEX, TROPONINI in the last 168 hours. ? ?BNP (last 3 results) ?No results for input(s): BNP in the last 8760 hours. ? ?ProBNP (last 3 results) ?No results for input(s): PROBNP in the last 8760  hours. ? ?Radiological Exams: ?No results found. ? ?Assessment/Plan ?Active Problems: ?  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  Aspiration pneumonia ([place]) ?  Paroxysmal A-fib ([place]) ?  Tracheal stenosis ?  Acute respiratory failure with hypoxia ([place]) ? ?Acute on chronic respiratory failure with hypoxia- we will continue with the T collar trials as tolerated ,currently on 28% FiO2 with PMV in place, we will continue with secretion management and pulmonary toilet. ENT evaluation will be needed post discharge. 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 ? ?I have personally seen and evaluated the patient, evaluated laboratory and imaging results, formulated the assessment and plan and placed orders. ?The Patient requires high complexity decision making with multiple systems involvement.  ?Rounds were done with the Respiratory Therapy Director and Staff therapists and discussed with nursing staff also. ? ?Allyne Gee, MD FCCP ?Pulmonary Critical Care Medicine ?Sleep Medicine  ?

## 2021-11-24 NOTE — Progress Notes (Addendum)
Pulmonary Critical Care Medicine ?[facility] ?  ?PULMONARY CRITICAL CARE SERVICE ? ?PROGRESS NOTE ? ? ? ? ?Caleb Vaughan  ?[id number]: December 10, 1960  ? ?DOA: 10/16/2021 ? ?Referring Physician: Satira Sark, MD ? ?HPI: Caleb Vaughan is a 61 y.o. male being followed for ventilator/airway/oxygen weaning Acute on Chronic Respiratory Failure. Patient seen sitting in bed, he gave me another thumbs up today. ATC 28% with PMV in place. No acute distress, no acute overnight events. ? ?Medications: ?Reviewed on Rounds ? ?Physical Exam: ? ?Vitals: temp 97.1, pulse 80, RR 14, BP 163/89, SPO2 97% ? ?Ventilator Settings ATC 28% with PMV in place ? ?General: Comfortable at this time ?Neck: supple ?Cardiovascular: no malignant arrhythmias ?Respiratory: Right lung clear in all lobes, left lung diminished in all lobes ?Skin: no rash seen on limited exam ?Musculoskeletal: No gross abnormality ?Psychiatric:unable to assess ?Neurologic:no involuntary movements   ?   ?   ?Lab Data:  ? ?Basic Metabolic Panel: ?Recent Labs  ?Lab 11/18/21 ?0616 11/20/21 ?0430 11/22/21 ?0512  ?NA 146* 146* 144  ?K 3.9 4.0 3.6  ?CL 103 107 105  ?CO2 36* 28 32  ?GLUCOSE 134* 103* 191*  ?BUN 30* 35* 33*  ?CREATININE 1.23 1.07 0.95  ?CALCIUM 10.0 9.6 9.5  ?MG 2.6*  --  2.4  ?PHOS  --  4.3 4.1  ? ? ?ABG: ?No results for input(s): PHART, PCO2ART, PO2ART, HCO3, O2SAT in the last 168 hours. ? ?Liver Function Tests: ?Recent Labs  ?Lab 11/20/21 ?0430 11/22/21 ?0512  ?ALBUMIN 3.2* 3.3*  ? ?No results for input(s): LIPASE, AMYLASE in the last 168 hours. ?No results for input(s): AMMONIA in the last 168 hours. ? ?CBC: ?Recent Labs  ?Lab 11/18/21 ?0616 11/22/21 ?0512  ?WBC 12.8* 8.7  ?HGB 11.3* 10.9*  ?HCT 34.8* 32.7*  ?MCV 93.5 91.6  ?PLT 237 214  ? ? ?Cardiac Enzymes: ?No results for input(s): CKTOTAL, CKMB, CKMBINDEX, TROPONINI in the last 168 hours. ? ?BNP (last 3 results) ?No results for input(s): BNP in the last 8760 hours. ? ?ProBNP  (last 3 results) ?No results for input(s): PROBNP in the last 8760 hours. ? ?Radiological Exams: ?No results found. ? ?Assessment/Plan ?Active Problems: ?  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  Aspiration pneumonia ([place]) ?  Paroxysmal A-fib ([place]) ?  Tracheal stenosis ?  Acute respiratory failure with hypoxia ([place]) ? ?Acute on chronic respiratory failure with hypoxia- we will continue with the T collar trials as tolerated ,currently on 28% FiO2 with PMV in place, we will continue with secretion management and pulmonary toilet. ENT evaluation will be needed post discharge. 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 ? ?I have personally seen and evaluated the patient, evaluated laboratory and imaging results, formulated the assessment and plan and placed orders. ?The Patient requires high complexity decision making with multiple systems involvement.  ?Rounds were done with the Respiratory Therapy Director and Staff therapists and discussed with nursing staff also. ? ?Allyne Gee, MD FCCP ?Pulmonary Critical Care Medicine ?Sleep Medicine  ?

## 2021-11-25 NOTE — Progress Notes (Signed)
Pulmonary Critical Care Medicine ?[facility] [place] ?  ?PULMONARY CRITICAL CARE SERVICE ? ?PROGRESS NOTE ? ? ? ? ?[name]  ?[id number]:[id number]  ?DOB: 01/25/1961  ? ?DOA: 10/16/2021 ? ?Referring Physician: Luna Kitchens, MD ? ?HPI: Caleb Vaughan is a 61 y.o. male being followed for ventilator/airway/oxygen weaning Acute on Chronic Respiratory Failure.  Patient is afebrile right now resting comfortably without distress has been on the T collar with PMV ? ?Medications: ?Reviewed on Rounds ? ?Physical Exam: ? ?Vitals: Temperature is 96.5 pulse 72 respiratory 22 blood pressure is 95/49 saturations 94% ? ?Ventilator Settings on T collar FiO2 28% PMV as tolerated ? ?General: Comfortable at this time ?Neck: supple ?Cardiovascular: no malignant arrhythmias ?Respiratory: No rhonchi or rales are noted at this time ?Skin: no rash seen on limited exam ?Musculoskeletal: No gross abnormality ?Psychiatric:unable to assess ?Neurologic:no involuntary movements   ?   ?   ?Lab Data:  ? ?Basic Metabolic Panel: ?Recent Labs  ?Lab 11/20/21 ?0430 11/22/21 ?2458  ?NA 146* 144  ?K 4.0 3.6  ?CL 107 105  ?CO2 28 32  ?GLUCOSE 103* 191*  ?BUN 35* 33*  ?CREATININE 1.07 0.95  ?CALCIUM 9.6 9.5  ?MG  --  2.4  ?PHOS 4.3 4.1  ? ? ?ABG: ?No results for input(s): PHART, PCO2ART, PO2ART, HCO3, O2SAT in the last 168 hours. ? ?Liver Function Tests: ?Recent Labs  ?Lab 11/20/21 ?0430 11/22/21 ?0998  ?ALBUMIN 3.2* 3.3*  ? ?No results for input(s): LIPASE, AMYLASE in the last 168 hours. ?No results for input(s): AMMONIA in the last 168 hours. ? ?CBC: ?Recent Labs  ?Lab 11/22/21 ?3382  ?WBC 8.7  ?HGB 10.9*  ?HCT 32.7*  ?MCV 91.6  ?PLT 214  ? ? ?Cardiac Enzymes: ?No results for input(s): CKTOTAL, CKMB, CKMBINDEX, TROPONINI in the last 168 hours. ? ?BNP (last 3 results) ?No results for input(s): BNP in the last 8760 hours. ? ?ProBNP (last 3 results) ?No results for input(s): PROBNP in the last 8760 hours. ? ?Radiological Exams: ?No results  found. ? ?Assessment/Plan ?Active Problems: ?  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  Aspiration pneumonia ([facility]) ?  Paroxysmal A-fib ([facility]) ?  Tracheal stenosis ?  Acute respiratory failure with hypoxia ([facility]) ? ? ?Acute on chronic respiratory failure with hypoxia we will continue with the wean on T-piece patient is on using the PMV also.  I suggested to him by respiratory therapy to change to a #6 XLT distal to see if this helps at all in terms of being able to do capping trials ?Aspiration pneumonia has been treated with antibiotics ?Paroxysmal atrial fibrillation rate controlled we will continue to follow along closely. ?Tracheal stenosis supportive care ?Nontraumatic thalamic hemorrhage supportive care patient prognosis is guarded ? ? ?I have personally seen and evaluated the patient, evaluated laboratory and imaging results, formulated the assessment and plan and placed orders. ?The Patient requires high complexity decision making with multiple systems involvement.  ?Rounds were done with the Respiratory Therapy Director and Staff therapists and discussed with nursing staff also. ? ?Yevonne Pax, MD FCCP ?Pulmonary Critical Care Medicine ?Sleep Medicine ? ?

## 2021-11-26 LAB — RENAL FUNCTION PANEL
Albumin: 3.3 g/dL — ABNORMAL LOW (ref 3.5–5.0)
Anion gap: 8 (ref 5–15)
BUN: 22 mg/dL — ABNORMAL HIGH (ref 6–20)
CO2: 31 mmol/L (ref 22–32)
Calcium: 10.1 mg/dL (ref 8.9–10.3)
Chloride: 108 mmol/L (ref 98–111)
Creatinine, Ser: 1.09 mg/dL (ref 0.61–1.24)
GFR, Estimated: >60 mL/min (ref >60)
Glucose, Bld: 46 mg/dL — ABNORMAL LOW (ref 70–99)
Phosphorus: 4.5 mg/dL (ref 2.5–4.6)
Potassium: 3 mmol/L — ABNORMAL LOW (ref 3.5–5.1)
Sodium: 147 mmol/L — ABNORMAL HIGH (ref 135–145)

## 2021-11-26 LAB — CBC
HCT: 31.3 % — ABNORMAL LOW (ref 39.0–52.0)
Hemoglobin: 10.3 g/dL — ABNORMAL LOW (ref 13.0–17.0)
MCH: 30.1 pg (ref 26.0–34.0)
MCHC: 32.9 g/dL (ref 30.0–36.0)
MCV: 91.5 fL (ref 80.0–100.0)
Platelets: 216 10*3/uL (ref 150–400)
RBC: 3.42 MIL/uL — ABNORMAL LOW (ref 4.22–5.81)
RDW: 12.9 % (ref 11.5–15.5)
WBC: 9.5 10*3/uL (ref 4.0–10.5)
nRBC: 0 % (ref 0.0–0.2)

## 2021-11-26 LAB — MAGNESIUM: Magnesium: 2.4 mg/dL (ref 1.7–2.4)

## 2021-11-26 NOTE — Progress Notes (Signed)
Pulmonary Critical Care Medicine ?[facility] ?  ?PULMONARY CRITICAL CARE SERVICE ? ?PROGRESS NOTE ? ? ? ? ?Caleb Vaughan  ?[id number]: 1961-07-19  ? ?DOA: 10/16/2021 ? ?Referring Physician: Satira Sark, MD ? ?HPI: Caleb Vaughan is a 61 y.o. male being followed for ventilator/airway/oxygen weaning Acute on Chronic Respiratory Failure.  Patient yesterday had the tracheostomy changed out to a XLT and then was placed on capping trials apparently patient was able to do 6 hours of capping without major distress at the end he stated he wanted to go back on the T collar.  This morning the patient was started on capping trials however told the ER respiratory therapist after 2 hours that he wanted to go back on the T-bar.  Reportedly he was not in any distress ? ?Medications: ?Reviewed on Rounds ? ?Physical Exam: ? ?Vitals: Temperature is 97.5 pulse 77 respiratory rate is 14 blood pressure is 163/72 saturations 97% ? ?Ventilator Settings on T-piece FiO2 is 28% ? ?General: Comfortable at this time ?Neck: supple ?Cardiovascular: no malignant arrhythmias ?Respiratory: Scattered rhonchi very coarse breath sounds are noted at this time.  No stridor ?Skin: no rash seen on limited exam ?Musculoskeletal: No gross abnormality ?Psychiatric:unable to assess ?Neurologic:no involuntary movements   ?   ?   ?Lab Data:  ? ?Basic Metabolic Panel: ?Recent Labs  ?Lab 11/20/21 ?0430 11/22/21 ?[id number] 11/26/21 ?0251  ?NA 146* 144 147*  ?K 4.0 3.6 3.0*  ?CL 107 105 108  ?CO2 28 32 31  ?GLUCOSE 103* 191* 46*  ?BUN 35* 33* 22*  ?CREATININE 1.07 0.95 1.09  ?CALCIUM 9.6 9.5 10.1  ?MG  --  2.4 2.4  ?PHOS 4.3 4.1 4.5  ? ? ?ABG: ?No results for input(s): PHART, PCO2ART, PO2ART, HCO3, O2SAT in the last 168 hours. ? ?Liver Function Tests: ?Recent Labs  ?Lab 11/20/21 ?0430 11/22/21 ?[id number] 11/26/21 ?0251  ?ALBUMIN 3.2* 3.3* 3.3*  ? ?No results for input(s): LIPASE, AMYLASE in the last 168 hours. ?No results for input(s):  AMMONIA in the last 168 hours. ? ?CBC: ?Recent Labs  ?Lab 11/22/21 ?[id number] 11/26/21 ?0251  ?WBC 8.7 9.5  ?HGB 10.9* 10.3*  ?HCT 32.7* 31.3*  ?MCV 91.6 91.5  ?PLT 214 216  ? ? ?Cardiac Enzymes: ?No results for input(s): CKTOTAL, CKMB, CKMBINDEX, TROPONINI in the last 168 hours. ? ?BNP (last 3 results) ?No results for input(s): BNP in the last 8760 hours. ? ?ProBNP (last 3 results) ?No results for input(s): PROBNP in the last 8760 hours. ? ?Radiological Exams: ?No results found. ? ?Assessment/Plan ?Active Problems: ?  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  Aspiration pneumonia ([place]) ?  Paroxysmal A-fib ([place]) ?  Tracheal stenosis ?  Acute respiratory failure with hypoxia ([place]) ? ? ?Acute on chronic respiratory failure hypoxia patient did not do well with 6 hours of capping yesterday we will try capping once again today.  Patient needs ongoing reassurance I think and we might be able to make some headway ?Aspiration pneumonia has been treated with antibiotics we will continue to monitor and follow along patient is clinically improved ?Paroxysmal atrial fibrillation is rate is controlled we will continue to follow along closely. ?Tracheal stenosis no changes are noted we will continue with supportive care ?Nontraumatic thalamic hemorrhage no change supportive care ? ? ?I have personally seen and evaluated the patient, evaluated laboratory and imaging results, formulated the assessment and plan and placed orders. ?The Patient requires high complexity decision making with multiple  systems involvement.  ?Rounds were done with the Respiratory Therapy Director and Staff therapists and discussed with nursing staff also. ? ?Allyne Gee, MD FCCP ?Pulmonary Critical Care Medicine ?Sleep Medicine ? ?

## 2021-11-27 LAB — RENAL FUNCTION PANEL
Albumin: 3.2 g/dL — ABNORMAL LOW (ref 3.5–5.0)
Anion gap: 8 (ref 5–15)
BUN: 18 mg/dL (ref 6–20)
CO2: 32 mmol/L (ref 22–32)
Calcium: 9.8 mg/dL (ref 8.9–10.3)
Chloride: 108 mmol/L (ref 98–111)
Creatinine, Ser: 1.15 mg/dL (ref 0.61–1.24)
GFR, Estimated: >60 mL/min (ref >60)
Glucose, Bld: 108 mg/dL — ABNORMAL HIGH (ref 70–99)
Phosphorus: 4 mg/dL (ref 2.5–4.6)
Potassium: 3.6 mmol/L (ref 3.5–5.1)
Sodium: 148 mmol/L — ABNORMAL HIGH (ref 135–145)

## 2021-11-27 LAB — CBC
HCT: 30.8 % — ABNORMAL LOW (ref 39.0–52.0)
Hemoglobin: 10.2 g/dL — ABNORMAL LOW (ref 13.0–17.0)
MCH: 30.4 pg (ref 26.0–34.0)
MCHC: 33.1 g/dL (ref 30.0–36.0)
MCV: 91.9 fL (ref 80.0–100.0)
Platelets: 219 10*3/uL (ref 150–400)
RBC: 3.35 MIL/uL — ABNORMAL LOW (ref 4.22–5.81)
RDW: 13.2 % (ref 11.5–15.5)
WBC: 8.1 10*3/uL (ref 4.0–10.5)
nRBC: 0 % (ref 0.0–0.2)

## 2021-11-27 LAB — MAGNESIUM: Magnesium: 2.2 mg/dL (ref 1.7–2.4)

## 2021-11-27 NOTE — Progress Notes (Signed)
Pulmonary Critical Care Medicine ?[facility] [place] ?  ?PULMONARY CRITICAL CARE SERVICE ? ?PROGRESS NOTE ? ? ? ? ?Caleb Vaughan  ?[id number]  ?DOB: [birth year]  ? ?DOA: 10/16/2021 ? ?Referring Physician: Luna Kitchens, MD ? ?HPI: Caleb Vaughan is a 61 y.o. male being followed for ventilator/airway/oxygen weaning Acute on Chronic Respiratory Failure.  Patient is on T-piece at this time appears to be comfortable we will try to do capping if patient is able to tolerate ? ?Medications: ?Reviewed on Rounds ? ?Physical Exam: ? ?Vitals: Temperature is 97.5 pulse 96 respiratory rate 18 blood pressure is 132/66 saturations 97% ? ?Ventilator Settings off the ventilator on T-piece ? ?General: Comfortable at this time ?Neck: supple ?Cardiovascular: no malignant arrhythmias ?Respiratory: No rhonchi no rales no stridor ?Skin: no rash seen on limited exam ?Musculoskeletal: No gross abnormality ?Psychiatric:unable to assess ?Neurologic:no involuntary movements   ?   ?   ?Lab Data:  ? ?Basic Metabolic Panel: ?Recent Labs  ?Lab 11/22/21 ?1287 11/26/21 ?0251 11/27/21 ?0448  ?NA 144 147* 148*  ?K 3.6 3.0* 3.6  ?CL 105 108 108  ?CO2 32 31 32  ?GLUCOSE 191* 46* 108*  ?BUN 33* 22* 18  ?CREATININE 0.95 1.09 1.15  ?CALCIUM 9.5 10.1 9.8  ?MG 2.4 2.4 2.2  ?PHOS 4.1 4.5 4.0  ? ? ?ABG: ?No results for input(s): PHART, PCO2ART, PO2ART, HCO3, O2SAT in the last 168 hours. ? ?Liver Function Tests: ?Recent Labs  ?Lab 11/22/21 ?[id number] 11/26/21 ?0251 11/27/21 ?0448  ?ALBUMIN 3.3* 3.3* 3.2*  ? ?No results for input(s): LIPASE, AMYLASE in the last 168 hours. ?No results for input(s): AMMONIA in the last 168 hours. ? ?CBC: ?Recent Labs  ?Lab 11/22/21 ?7209 11/26/21 ?0251 11/27/21 ?0448  ?WBC 8.7 9.5 8.1  ?HGB 10.9* 10.3* 10.2*  ?HCT 32.7* 31.3* 30.8*  ?MCV 91.6 91.5 91.9  ?PLT 214 216 219  ? ? ?Cardiac Enzymes: ?No results for input(s): CKTOTAL, CKMB, CKMBINDEX, TROPONINI in the last 168 hours. ? ?BNP (last 3 results) ?No results for  input(s): BNP in the last 8760 hours. ? ?ProBNP (last 3 results) ?No results for input(s): PROBNP in the last 8760 hours. ? ?Radiological Exams: ?No results found. ? ?Assessment/Plan ?Active Problems: ?  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  Aspiration pneumonia ([facility]) ?  Paroxysmal A-fib ([facility]) ?  Tracheal stenosis ?  Acute respiratory failure with hypoxia ([facility]) ? ? ?Acute on chronic respiratory failure with hypoxia encourage compliance with recommended weaning patient has a lot of issues with anxiety when we try to do capping on him ?Aspiration pneumonia has been treated continue to follow along ?Paroxysmal atrial fibrillation rate is controlled we will continue with supportive care ?Tracheal stenosis no change we will continue to follow along closely ?Nontraumatic intracranial hemorrhage supportive care ? ? ?I have personally seen and evaluated the patient, evaluated laboratory and imaging results, formulated the assessment and plan and placed orders. ?The Patient requires high complexity decision making with multiple systems involvement.  ?Rounds were done with the Respiratory Therapy Director and Staff therapists and discussed with nursing staff also. ? ?Yevonne Pax, MD FCCP ?Pulmonary Critical Care Medicine ?Sleep Medicine ? ?

## 2021-11-28 LAB — RENAL FUNCTION PANEL
Albumin: 3.1 g/dL — ABNORMAL LOW (ref 3.5–5.0)
Anion gap: 8 (ref 5–15)
BUN: 22 mg/dL — ABNORMAL HIGH (ref 6–20)
CO2: 29 mmol/L (ref 22–32)
Calcium: 9.6 mg/dL (ref 8.9–10.3)
Chloride: 108 mmol/L (ref 98–111)
Creatinine, Ser: 1.19 mg/dL (ref 0.61–1.24)
GFR, Estimated: >60 mL/min (ref >60)
Glucose, Bld: 93 mg/dL (ref 70–99)
Phosphorus: 3.5 mg/dL (ref 2.5–4.6)
Potassium: 3.3 mmol/L — ABNORMAL LOW (ref 3.5–5.1)
Sodium: 145 mmol/L (ref 135–145)

## 2021-11-28 NOTE — Progress Notes (Signed)
Pulmonary Critical Care Medicine ?[facility] [place] ?  ?PULMONARY CRITICAL CARE SERVICE ? ?PROGRESS NOTE ? ? ? ? ?Caleb Vaughan  ?[id number]  ?DOB: November 29, 1960  ? ?DOA: 10/16/2021 ? ?Referring Physician: Luna Kitchens, MD ? ?HPI: Caleb Vaughan is a 61 y.o. male being followed for ventilator/airway/oxygen weaning Acute on Chronic Respiratory Failure.  Patient currently is on T collar has been on 28% FiO2 using PMV did not tolerate capping once again today he has a lot of anxiety issues ? ?Medications: ?Reviewed on Rounds ? ?Physical Exam: ? ?Vitals: Temperature is 98.1 pulse 76 respiratory is 15 blood pressure 139/47 saturations 99% ? ?Ventilator Settings failure with T collar FiO2 28% using PMV ? ?General: Comfortable at this time ?Neck: supple ?Cardiovascular: no malignant arrhythmias ?Respiratory: Scattered rhonchi expansion is equal ?Skin: no rash seen on limited exam ?Musculoskeletal: No gross abnormality ?Psychiatric:unable to assess ?Neurologic:no involuntary movements   ?   ?   ?Lab Data:  ? ?Basic Metabolic Panel: ?Recent Labs  ?Lab 11/22/21 ?2774 11/26/21 ?0251 11/27/21 ?0448 11/28/21 ?0117  ?NA 144 147* 148* 145  ?K 3.6 3.0* 3.6 3.3*  ?CL 105 108 108 108  ?CO2 32 31 32 29  ?GLUCOSE 191* 46* 108* 93  ?BUN 33* 22* 18 22*  ?CREATININE 0.95 1.09 1.15 1.19  ?CALCIUM 9.5 10.1 9.8 9.6  ?MG 2.4 2.4 2.2  --   ?PHOS 4.1 4.5 4.0 3.5  ? ? ?ABG: ?No results for input(s): PHART, PCO2ART, PO2ART, HCO3, O2SAT in the last 168 hours. ? ?Liver Function Tests: ?Recent Labs  ?Lab 11/22/21 ?1287 11/26/21 ?0251 11/27/21 ?0448 11/28/21 ?0117  ?ALBUMIN 3.3* 3.3* 3.2* 3.1*  ? ?No results for input(s): LIPASE, AMYLASE in the last 168 hours. ?No results for input(s): AMMONIA in the last 168 hours. ? ?CBC: ?Recent Labs  ?Lab 11/22/21 ?8676 11/26/21 ?0251 11/27/21 ?0448  ?WBC 8.7 9.5 8.1  ?HGB 10.9* 10.3* 10.2*  ?HCT 32.7* 31.3* 30.8*  ?MCV 91.6 91.5 91.9  ?PLT 214 216 219  ? ? ?Cardiac Enzymes: ?No results for  input(s): CKTOTAL, CKMB, CKMBINDEX, TROPONINI in the last 168 hours. ? ?BNP (last 3 results) ?No results for input(s): BNP in the last 8760 hours. ? ?ProBNP (last 3 results) ?No results for input(s): PROBNP in the last 8760 hours. ? ?Radiological Exams: ?No results found. ? ?Assessment/Plan ?Active Problems: ?  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  Aspiration pneumonia (HCC) ?  Paroxysmal A-fib (HCC) ?  Tracheal stenosis ?  Acute respiratory failure with hypoxia (HCC) ? ? ?Acute on chronic respiratory failure with hypoxia continue with the T collar trials will titrate oxygen as tolerated continue secretion management pulmonary toilet. ?Aspiration pneumonia has been treated with antibiotics we will continue to monitor closely. ?Paroxysmal atrial fibrillation rate is controlled continue to monitor on telemetry ?Tracheal stenosis patient has not been tolerating the capping as already indicated above ?Nontraumatic thalamic stroke supportive care appears to be doing a little bit better clinically ? ? ?I have personally seen and evaluated the patient, evaluated laboratory and imaging results, formulated the assessment and plan and placed orders. ?The Patient requires high complexity decision making with multiple systems involvement.  ?Rounds were done with the Respiratory Therapy Director and Staff therapists and discussed with nursing staff also. ? ?Yevonne Pax, MD FCCP ?Pulmonary Critical Care Medicine ?Sleep Medicine ? ?

## 2021-11-29 LAB — BASIC METABOLIC PANEL
Anion gap: 8 (ref 5–15)
BUN: 19 mg/dL (ref 6–20)
CO2: 27 mmol/L (ref 22–32)
Calcium: 9.2 mg/dL (ref 8.9–10.3)
Chloride: 106 mmol/L (ref 98–111)
Creatinine, Ser: 1.23 mg/dL (ref 0.61–1.24)
GFR, Estimated: >60 mL/min (ref >60)
Glucose, Bld: 154 mg/dL — ABNORMAL HIGH (ref 70–99)
Potassium: 3.4 mmol/L — ABNORMAL LOW (ref 3.5–5.1)
Sodium: 141 mmol/L (ref 135–145)

## 2021-11-29 NOTE — Progress Notes (Signed)
Pulmonary Critical Care Medicine ?[facility] [place] ?  ?PULMONARY CRITICAL CARE SERVICE ? ?PROGRESS NOTE ? ? ? ? ?Caleb Vaughan  ?[id number]  ?DOB: 1961/08/13  ? ?DOA: 10/16/2021 ? ?Referring Physician: Luna Kitchens, MD ? ?HPI: Caleb Vaughan is a 61 y.o. male being followed for ventilator/airway/oxygen weaning Acute on Chronic Respiratory Failure.  Patient is on T collar currently on 28% FiO2 has been using PMV should be able to proceed to capping again try to do capping if patient does not have issues with anxiety ? ?Medications: ?Reviewed on Rounds ? ?Physical Exam: ? ?Vitals: Temperature 98.3 pulse 67 respiratory rate is 15 blood pressure is 135/55 saturations 97% ? ?Ventilator Settings on T collar FiO2 28% ? ?General: Comfortable at this time ?Neck: supple ?Cardiovascular: no malignant arrhythmias ?Respiratory: Scattered rhonchi expansion is equal ?Skin: no rash seen on limited exam ?Musculoskeletal: No gross abnormality ?Psychiatric:unable to assess ?Neurologic:no involuntary movements   ?   ?   ?Lab Data:  ? ?Basic Metabolic Panel: ?Recent Labs  ?Lab 11/26/21 ?0251 11/27/21 ?0448 11/28/21 ?0117 11/29/21 ?1010  ?NA 147* 148* 145 141  ?K 3.0* 3.6 3.3* 3.4*  ?CL 108 108 108 106  ?CO2 31 32 29 27  ?GLUCOSE 46* 108* 93 154*  ?BUN 22* 18 22* 19  ?CREATININE 1.09 1.15 1.19 1.23  ?CALCIUM 10.1 9.8 9.6 9.2  ?MG 2.4 2.2  --   --   ?PHOS 4.5 4.0 3.5  --   ? ? ?ABG: ?No results for input(s): PHART, PCO2ART, PO2ART, HCO3, O2SAT in the last 168 hours. ? ?Liver Function Tests: ?Recent Labs  ?Lab 11/26/21 ?0251 11/27/21 ?0448 11/28/21 ?0117  ?ALBUMIN 3.3* 3.2* 3.1*  ? ?No results for input(s): LIPASE, AMYLASE in the last 168 hours. ?No results for input(s): AMMONIA in the last 168 hours. ? ?CBC: ?Recent Labs  ?Lab 11/26/21 ?0251 11/27/21 ?0448  ?WBC 9.5 8.1  ?HGB 10.3* 10.2*  ?HCT 31.3* 30.8*  ?MCV 91.5 91.9  ?PLT 216 219  ? ? ?Cardiac Enzymes: ?No results for input(s): CKTOTAL, CKMB, CKMBINDEX, TROPONINI  in the last 168 hours. ? ?BNP (last 3 results) ?No results for input(s): BNP in the last 8760 hours. ? ?ProBNP (last 3 results) ?No results for input(s): PROBNP in the last 8760 hours. ? ?Radiological Exams: ?No results found. ? ?Assessment/Plan ?Active Problems: ?  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  Aspiration pneumonia (HCC) ?  Paroxysmal A-fib (HCC) ?  Tracheal stenosis ?  Acute respiratory failure with hypoxia (HCC) ? ? ?Acute on chronic respiratory failure hypoxia we will try capping trials once again ?Aspiration pneumonia has been treated with antibiotics continue to follow along ?Paroxysmal atrial fibrillation rate is controlled ?Tracheal stenosis supportive care will need follow-up after discharge ?Nontraumatic intracranial hemorrhage supportive care we will continue to follow along ? ? ?I have personally seen and evaluated the patient, evaluated laboratory and imaging results, formulated the assessment and plan and placed orders. ?The Patient requires high complexity decision making with multiple systems involvement.  ?Rounds were done with the Respiratory Therapy Director and Staff therapists and discussed with nursing staff also. ? ?Yevonne Pax, MD FCCP ?Pulmonary Critical Care Medicine ?Sleep Medicine ? ?

## 2021-11-30 LAB — POTASSIUM: Potassium: 3.3 mmol/L — ABNORMAL LOW (ref 3.5–5.1)

## 2021-11-30 LAB — MAGNESIUM: Magnesium: 2.3 mg/dL (ref 1.7–2.4)

## 2021-11-30 NOTE — Progress Notes (Signed)
Pulmonary Critical Care Medicine ?[facility] [place] ?  ?PULMONARY CRITICAL CARE SERVICE ? ?PROGRESS NOTE ? ? ? ? ?Caleb Vaughan  ?[id number]  ?DOB: 16-Jan-1961  ? ?DOA: 10/16/2021 ? ?Referring Physician: Luna Kitchens, MD ? ?HPI: Caleb Vaughan is a 61 y.o. male being followed for ventilator/airway/oxygen weaning Acute on Chronic Respiratory Failure.  Patient is on T collar currently has been on 28% FiO2 has been using the PMV ? ?Medications: ?Reviewed on Rounds ? ?Physical Exam: ? ?Vitals: Temperature is 98.0 pulse 75 respiratory 17 blood pressure 158/79 saturations 99% ? ?Ventilator Settings T collar FiO2 28% ? ?General: Comfortable at this time ?Neck: supple ?Cardiovascular: no malignant arrhythmias ?Respiratory: Scattered rhonchi expansion equal ?Skin: no rash seen on limited exam ?Musculoskeletal: No gross abnormality ?Psychiatric:unable to assess ?Neurologic:no involuntary movements   ?   ?   ?Lab Data:  ? ?Basic Metabolic Panel: ?Recent Labs  ?Lab 11/26/21 ?0251 11/27/21 ?0448 11/28/21 ?0117 11/29/21 ?1010 11/30/21 ?0323  ?NA 147* 148* 145 141  --   ?K 3.0* 3.6 3.3* 3.4* 3.3*  ?CL 108 108 108 106  --   ?CO2 31 32 29 27  --   ?GLUCOSE 46* 108* 93 154*  --   ?BUN 22* 18 22* 19  --   ?CREATININE 1.09 1.15 1.19 1.23  --   ?CALCIUM 10.1 9.8 9.6 9.2  --   ?MG 2.4 2.2  --   --  2.3  ?PHOS 4.5 4.0 3.5  --   --   ? ? ?ABG: ?No results for input(s): PHART, PCO2ART, PO2ART, HCO3, O2SAT in the last 168 hours. ? ?Liver Function Tests: ?Recent Labs  ?Lab 11/26/21 ?0251 11/27/21 ?0448 11/28/21 ?0117  ?ALBUMIN 3.3* 3.2* 3.1*  ? ?No results for input(s): LIPASE, AMYLASE in the last 168 hours. ?No results for input(s): AMMONIA in the last 168 hours. ? ?CBC: ?Recent Labs  ?Lab 11/26/21 ?0251 11/27/21 ?0448  ?WBC 9.5 8.1  ?HGB 10.3* 10.2*  ?HCT 31.3* 30.8*  ?MCV 91.5 91.9  ?PLT 216 219  ? ? ?Cardiac Enzymes: ?No results for input(s): CKTOTAL, CKMB, CKMBINDEX, TROPONINI in the last 168 hours. ? ?BNP (last 3  results) ?No results for input(s): BNP in the last 8760 hours. ? ?ProBNP (last 3 results) ?No results for input(s): PROBNP in the last 8760 hours. ? ?Radiological Exams: ?No results found. ? ?Assessment/Plan ?Active Problems: ?  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  Aspiration pneumonia (HCC) ?  Paroxysmal A-fib (HCC) ?  Tracheal stenosis ?  Acute respiratory failure with hypoxia (HCC) ? ? ?Acute on chronic respiratory failure hypoxia continue continue with the PMV on T collar as tolerated.  Continue secretion management pulmonary toilet ?Aspiration pneumonia has been treated with antibiotics ?Paroxysmal atrial fibrillation rate is controlled ?Tracheal stenosis no change ?Nontraumatic intracranial hemorrhage supportive care ? ? ?I have personally seen and evaluated the patient, evaluated laboratory and imaging results, formulated the assessment and plan and placed orders. ?The Patient requires high complexity decision making with multiple systems involvement.  ?Rounds were done with the Respiratory Therapy Director and Staff therapists and discussed with nursing staff also. ? ?Yevonne Pax, MD FCCP ?Pulmonary Critical Care Medicine ?Sleep Medicine ? ?

## 2021-12-01 LAB — POTASSIUM: Potassium: 3.3 mmol/L — ABNORMAL LOW (ref 3.5–5.1)

## 2021-12-01 LAB — RESP PANEL BY RT-PCR (FLU A&B, COVID) ARPGX2
Influenza A by PCR: NEGATIVE
Influenza B by PCR: NEGATIVE
SARS Coronavirus 2 by RT PCR: NEGATIVE

## 2021-12-01 NOTE — Progress Notes (Signed)
Pulmonary Critical Care Medicine ?[place] ?  ?PULMONARY CRITICAL CARE SERVICE ? ?PROGRESS NOTE ? ? ? ? ?Caleb Vaughan  ?[id number]: 1961/03/07  ? ?DOA: 10/16/2021 ? ?Referring Physician: Satira Sark, MD ? ?HPI: Caleb Vaughan is a 61 y.o. male being followed for ventilator/airway/oxygen weaning Acute on Chronic Respiratory Failure.  Patient is on T collar currently on 28% FiO2 has been using the PMV ? ?Medications: ?Reviewed on Rounds ? ?Physical Exam: ? ?Vitals: Temperature is 96.2 pulse 72 respiratory 17 blood pressure 164/83 saturations 100% ? ?Ventilator Settings on T collar FiO2 is 28% ? ?General: Comfortable at this time ?Neck: supple ?Cardiovascular: no malignant arrhythmias ?Respiratory: Scattered rhonchi expansion is equal ?Skin: no rash seen on limited exam ?Musculoskeletal: No gross abnormality ?Psychiatric:unable to assess ?Neurologic:no involuntary movements   ?   ?   ?Lab Data:  ? ?Basic Metabolic Panel: ?Recent Labs  ?Lab 11/26/21 ?0251 11/27/21 ?0448 11/28/21 ?0117 11/29/21 ?1010 11/30/21 ?0323 12/01/21 ?[id number]  ?NA 147* 148* 145 141  --   --   ?K 3.0* 3.6 3.3* 3.4* 3.3* 3.3*  ?CL 108 108 108 106  --   --   ?CO2 31 32 29 27  --   --   ?GLUCOSE 46* 108* 93 154*  --   --   ?BUN 22* 18 22* 19  --   --   ?CREATININE 1.09 1.15 1.19 1.23  --   --   ?CALCIUM 10.1 9.8 9.6 9.2  --   --   ?MG 2.4 2.2  --   --  2.3  --   ?PHOS 4.5 4.0 3.5  --   --   --   ? ? ?ABG: ?No results for input(s): PHART, PCO2ART, PO2ART, HCO3, O2SAT in the last 168 hours. ? ?Liver Function Tests: ?Recent Labs  ?Lab 11/26/21 ?0251 11/27/21 ?0448 11/28/21 ?0117  ?ALBUMIN 3.3* 3.2* 3.1*  ? ?No results for input(s): LIPASE, AMYLASE in the last 168 hours. ?No results for input(s): AMMONIA in the last 168 hours. ? ?CBC: ?Recent Labs  ?Lab 11/26/21 ?0251 11/27/21 ?0448  ?WBC 9.5 8.1  ?HGB 10.3* 10.2*  ?HCT 31.3* 30.8*  ?MCV 91.5 91.9  ?PLT 216 219  ? ? ?Cardiac Enzymes: ?No results for input(s): CKTOTAL,  CKMB, CKMBINDEX, TROPONINI in the last 168 hours. ? ?BNP (last 3 results) ?No results for input(s): BNP in the last 8760 hours. ? ?ProBNP (last 3 results) ?No results for input(s): PROBNP in the last 8760 hours. ? ?Radiological Exams: ?No results found. ? ?Assessment/Plan ?Active Problems: ?  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  Aspiration pneumonia ([place]) ?  Paroxysmal A-fib ([place]) ?  Tracheal stenosis ?  Acute respiratory failure with hypoxia ([place]) ? ? ?Acute on chronic respiratory failure hypoxia patient has not been able to tolerate capping but does tolerate PMV plan is to continue with the T collar and PMV ?Aspiration pneumonia has been treated with antibiotics we will continue to follow along ?Tracheal stenosis limiting [place] from being able to decannulate him ?Paroxysmal atrial fibrillation rate controlled ?Nontraumatic intracranial hemorrhage overall no change ? ? ?I have personally seen and evaluated the patient, evaluated laboratory and imaging results, formulated the assessment and plan and placed orders. ?The Patient requires high complexity decision making with multiple systems involvement.  ?Rounds were done with the Respiratory Therapy Director and Staff therapists and discussed with nursing staff also. ? ?Allyne Gee, MD FCCP ?Pulmonary Critical Care Medicine ?Sleep Medicine ? ?

## 2021-12-02 LAB — RENAL FUNCTION PANEL
Albumin: 3.2 g/dL — ABNORMAL LOW (ref 3.5–5.0)
Anion gap: 8 (ref 5–15)
BUN: 17 mg/dL (ref 6–20)
CO2: 27 mmol/L (ref 22–32)
Calcium: 9.5 mg/dL (ref 8.9–10.3)
Chloride: 105 mmol/L (ref 98–111)
Creatinine, Ser: 1.15 mg/dL (ref 0.61–1.24)
GFR, Estimated: >60 mL/min (ref >60)
Glucose, Bld: 67 mg/dL — ABNORMAL LOW (ref 70–99)
Phosphorus: 4.3 mg/dL (ref 2.5–4.6)
Potassium: 3.4 mmol/L — ABNORMAL LOW (ref 3.5–5.1)
Sodium: 140 mmol/L (ref 135–145)

## 2021-12-02 LAB — MAGNESIUM: Magnesium: 2.1 mg/dL (ref 1.7–2.4)

## 2021-12-02 LAB — CBC
HCT: 30.3 % — ABNORMAL LOW (ref 39.0–52.0)
Hemoglobin: 10 g/dL — ABNORMAL LOW (ref 13.0–17.0)
MCH: 30.2 pg (ref 26.0–34.0)
MCHC: 33 g/dL (ref 30.0–36.0)
MCV: 91.5 fL (ref 80.0–100.0)
Platelets: 243 10*3/uL (ref 150–400)
RBC: 3.31 MIL/uL — ABNORMAL LOW (ref 4.22–5.81)
RDW: 13.3 % (ref 11.5–15.5)
WBC: 8.3 10*3/uL (ref 4.0–10.5)
nRBC: 0 % (ref 0.0–0.2)

## 2021-12-17 ENCOUNTER — Emergency Department (HOSPITAL_COMMUNITY)
Admission: EM | Admit: 2021-12-17 | Discharge: 2021-12-17 | Disposition: A | Discharge status: Skilled Nursing Facility | Payer: No Typology Code available for payment source | Attending: Emergency Medicine | Admitting: Emergency Medicine

## 2021-12-17 ENCOUNTER — Other Ambulatory Visit: Payer: Self-pay

## 2021-12-17 ENCOUNTER — Encounter (HOSPITAL_COMMUNITY): Payer: Self-pay

## 2021-12-17 ENCOUNTER — Emergency Department (HOSPITAL_COMMUNITY): Payer: No Typology Code available for payment source

## 2021-12-17 DIAGNOSIS — S0990XA Unspecified injury of head, initial encounter: Secondary | ICD-10-CM

## 2021-12-17 DIAGNOSIS — Z79899 Other long term (current) drug therapy: Secondary | ICD-10-CM | POA: Diagnosis not present

## 2021-12-17 DIAGNOSIS — S0083XA Contusion of other part of head, initial encounter: Secondary | ICD-10-CM | POA: Diagnosis not present

## 2021-12-17 DIAGNOSIS — M542 Cervicalgia: Secondary | ICD-10-CM | POA: Diagnosis not present

## 2021-12-17 DIAGNOSIS — I10 Essential (primary) hypertension: Secondary | ICD-10-CM | POA: Insufficient documentation

## 2021-12-17 DIAGNOSIS — W19XXXA Unspecified fall, initial encounter: Secondary | ICD-10-CM | POA: Insufficient documentation

## 2021-12-17 DIAGNOSIS — W050XXA Fall from non-moving wheelchair, initial encounter: Secondary | ICD-10-CM | POA: Insufficient documentation

## 2021-12-17 NOTE — ED Triage Notes (Signed)
Pt BIB [facility] from [facility] d/t falling out of his w/c when trying to reposition himself d/t a sore bottom from a pressure sore. Pt has a bump above his Lt side of head. He had a stroke last year & is being weaned off of the vent & had his trach removed 2 days ago & is still on 3L via n/c at baseline. He does has Hx of A/fib as well, not on thinners, is diabetic & when EMS picked him up his CBG was 516. A.Ox4, 22g PIV in Rt hand. Full code. 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 He also has his PEG tube in place, no issues.  ?

## 2021-12-17 NOTE — ED Notes (Signed)
Called and gave report to [facility]. ?

## 2021-12-17 NOTE — ED Notes (Signed)
Help get patient on the monitor did ekg shown to Dr Particia Nearing Patient is resting with nurse at bedside ?

## 2021-12-17 NOTE — ED Notes (Signed)
Called [facility], pt second in line, no other info given ?

## 2021-12-17 NOTE — ED Notes (Signed)
Received patient in no acute distress , patient is on 2L of 02, Pending [facility].  ?

## 2021-12-17 NOTE — Discharge Instructions (Addendum)
You came to the emergency department today to be evaluated for your head injury after suffering a fall.  Your physical exam was reassuring.  The CT scan of your head and neck did not show any acute broken bones, dislocations, or intracranial bleeding.  It did show that your left thyroid was slightly enlarged.  Please follow-up with your primary care doctor in the outpatient setting to have a ultrasound of your thyroid performed. ? ?Get help right away if: ?You have: ?A severe headache that is not helped by medicine. ?Trouble walking or weakness in your arms and legs. ?Clear or bloody fluid coming from your nose or ears. ?Changes in your vision. ?A seizure. ?Increased confusion or irritability. ?Your symptoms get worse. ?You are sleepier than normal and have trouble staying awake. ?You lose your balance. ?Your pupils change size. ?Your speech is slurred. ?Your dizziness gets worse. ?You vomit. ?

## 2021-12-17 NOTE — ED Provider Notes (Signed)
?[facility] ?Provider Note ? ? ?CSN: [id number] ?Arrival date & time: 12/17/21  1146 ? ?  ? ?History ? ?Chief Complaint  ?Patient presents with  ? Fall  ? ? ?Caleb Vaughan is a 61 y.o. male paroxysmal atrial fibrillation (not on anticoagulation), nontraumatic hemorrhage with cerebral edema and midline shift (08/2021), hypertension, hyperlipidemia, previous tracheostomy. ? ?Presents to the emergency department complaint of fall.  Patient states that he was trying to readjust himself in his wheelchair due to pain from his sacral ulcer when he fell out of the chair hitting his head.  Patient denies any loss of consciousness.  Patient states that he called for help immediately after his fall.  Patient is complaining of pain to left temple where he struck his head as well as the left trapezius muscle.   ? ?I spoke with RN Lattie Haw from [facility].  She reports that this event occurred at approximately 1040.  Fall was unwitnessed.  Patient was found with his wheelchair flipped over and his head against the ground.  Per RN patient was acting his normal self.  No reports of vomiting.  She reports that tracheostomy removed 2 days prior.  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 ? ?Patient denies any visual disturbance, new numbness or weakness, facial asymmetry, abdominal pain, nausea, vomiting, chest pain, shortness of breath.   ? ?Fall ?Pertinent negatives include no chest pain, no abdominal pain, no headaches and no shortness of breath.  ? ?  ? ?Home Medications ?Prior to Admission medications   ?Medication Sig Start Date End Date Taking? Authorizing Provider  ?acetaminophen (TYLENOL) 325 MG tablet Place 2 tablets (650 mg total) into feeding tube every 6 (six) hours as needed for mild pain or fever. 10/16/21   Samella Parr, NP  ?amLODipine (NORVASC) 10 MG tablet Place 1 tablet (10 mg total) into feeding tube daily. 10/17/21   Samella Parr, NP  ?bethanechol  (URECHOLINE) 25 MG tablet Place 1 tablet (25 mg total) into feeding tube 3 (three) times daily. 10/16/21   Samella Parr, NP  ?carvedilol (COREG) 25 MG tablet Place 1 tablet (25 mg total) into feeding tube 2 (two) times daily with a meal. 10/16/21   Samella Parr, NP  ?cefTRIAXone 1 g in sodium chloride 0.9 % 100 mL Inject 1 g into the vein daily. 10/16/21   Samella Parr, NP  ?chlorhexidine gluconate, MEDLINE KIT, (PERIDEX) 0.12 % solution 15 mLs by Mouth Rinse route 2 (two) times daily. 10/16/21   Samella Parr, NP  ?collagenase (SANTYL) ointment Apply topically daily. 10/17/21   Samella Parr, NP  ?enoxaparin (LOVENOX) 40 MG/0.4ML injection Inject 0.4 mLs (40 mg total) into the skin daily. 10/17/21   Samella Parr, NP  ?fiber (NUTRISOURCE FIBER) PACK packet Place 1 packet into feeding tube 3 (three) times daily. 10/16/21   Samella Parr, NP  ?flecainide (TAMBOCOR) 150 MG tablet Place 1 tablet (150 mg total) into feeding tube 2 (two) times daily. 10/16/21   Samella Parr, NP  ?hydrALAZINE (APRESOLINE) 100 MG tablet Place 1 tablet (100 mg total) into feeding tube every 8 (eight) hours. 10/16/21   Samella Parr, NP  ?hydrALAZINE (APRESOLINE) 20 MG/ML injection Inject 1 mL (20 mg total) into the vein every 4 (four) hours as needed (BP less than 160). 10/16/21   Samella Parr, NP  ?insulin aspart (NOVOLOG) 100 UNIT/ML injection Inject 0-20 Units into the skin every 4 (four)  hours. Do NOT hold insulin if patient is NPO. ?Resistant Scale. 10/16/21   Samella Parr, NP  ?insulin glargine-yfgn (SEMGLEE) 100 UNIT/ML injection Inject 0.08 mLs (8 Units total) into the skin 2 (two) times daily. 10/16/21   Samella Parr, NP  ?ipratropium-albuterol (DUONEB) 0.5-2.5 (3) MG/3ML SOLN Take 3 mLs by nebulization every 6 (six) hours as needed. 10/16/21   Samella Parr, NP  ?labetalol (NORMODYNE) 5 MG/ML injection Inject 4 mLs (20 mg total) into the vein every 10 (ten) minutes as needed (For SBP > 160, hold  for HR < 60, maximum daily dose = 351m). 10/16/21   ESamella Parr NP  ?loperamide HCl (IMODIUM) 1 MG/7.5ML suspension Place 15 mLs (2 mg total) into feeding tube as needed for diarrhea or loose stools. 10/16/21   ESamella Parr NP  ?metoprolol tartrate (LOPRESSOR) 5 MG/5ML SOLN injection Inject 2.5-5 mLs (2.5-5 mg total) into the vein every 3 (three) hours as needed (SBP > 180). 10/16/21   ESamella Parr NP  ?Mouthwashes (MOUTH RINSE) LIQD solution 15 mLs by Mouth Rinse route every 4 (four) hours. 10/16/21   ESamella Parr NP  ?nutrition supplement, JUVEN, (JUVEN) PACK Place 1 packet into feeding tube 2 (two) times daily between meals. 10/16/21   ESamella Parr NP  ?Nutritional Supplements (FEEDING SUPPLEMENT, JEVITY 1.5 CAL/FIBER,) LIQD Place 1,000 mLs into feeding tube continuous. 10/16/21   ESamella Parr NP  ?Nutritional Supplements (FEEDING SUPPLEMENT, PROSOURCE TF,) liquid Place 90 mLs into feeding tube 2 (two) times daily. 10/16/21   ESamella Parr NP  ?Nystatin (GERHARDT'S BUTT CREAM) CREA Apply 1 application topically 2 (two) times daily. 10/16/21   ESamella Parr NP  ?oxyCODONE (OXY IR/ROXICODONE) 5 MG immediate release tablet Place 1 tablet (5 mg total) into feeding tube 3 (three) times daily. 10/16/21   ESamella Parr NP  ?pantoprazole sodium (PROTONIX) 40 mg Place 40 mg into feeding tube at bedtime. 10/16/21   ESamella Parr NP  ?QUEtiapine (SEROQUEL) 50 MG tablet Place 1 tablet (50 mg total) into feeding tube at bedtime. 10/16/21   ESamella Parr NP  ?saccharomyces boulardii (FLORASTOR) 250 MG capsule Place 1 capsule (250 mg total) into feeding tube 2 (two) times daily. 10/16/21   ESamella Parr NP  ?scopolamine (TRANSDERM-SCOP) 1 MG/3DAYS Place 1 patch (1.5 mg total) onto the skin every 3 (three) days. 10/16/21   ESamella Parr NP  ?sodium chloride 0.9 % infusion Inject 1,000 mLs into the vein continuous. 10/16/21   ESamella Parr NP  ?sodium chloride flush (NS) 0.9 %  SOLN 10-40 mLs by Intracatheter route as needed (flush). 10/16/21   ESamella Parr NP  ?Water For Irrigation, Sterile (FREE WATER) SOLN Place 200 mLs into feeding tube every 3 (three) hours. 10/16/21   ESamella Parr NP  ?   ? ?Allergies    ?Apixaban, Iodinated contrast media, and Shellfish allergy   ? ?Review of Systems   ?Review of Systems  ?Constitutional:  Negative for chills and fever.  ?Eyes:  Negative for visual disturbance.  ?Respiratory:  Negative for shortness of breath.   ?Cardiovascular:  Negative for chest pain.  ?Gastrointestinal:  Negative for abdominal pain, nausea and vomiting.  ?Musculoskeletal:  Positive for back pain and neck pain.  ?Skin:  Positive for wound. Negative for color change, pallor and rash.  ?Neurological:  Positive for weakness (baseline left isded deficit) and numbness (baseline left isded deficit). Negative for dizziness, tremors, seizures,  syncope, facial asymmetry, speech difficulty, light-headedness and headaches.  ?Psychiatric/Behavioral:  Negative for confusion.   ? ?Physical Exam ?Updated Vital Signs ?BP (!) 141/95 (BP Location: Right Arm)   Pulse 80   Temp 98.2 ?F (36.8 ?C) (Oral)   Resp 15   SpO2 95%  ?Physical Exam ?Vitals and nursing note reviewed.  ?Constitutional:   ?   General: He is not in acute distress. ?   Appearance: He is not ill-appearing, toxic-appearing or diaphoretic.  ?HENT:  ?   Head: Normocephalic. Abrasion and contusion present. No raccoon eyes, Battle's sign, right periorbital erythema, left periorbital erythema or laceration.  ?   Comments: Superficial abrasion to left temple with minimal swelling ?   Right Ear: Tympanic membrane, ear canal and external ear normal.  ?   Left Ear: Tympanic membrane, ear canal and external ear normal.  ?Eyes:  ?   General: No scleral icterus.    ?   Right eye: No discharge.     ?   Left eye: No discharge.  ?Cardiovascular:  ?   Rate and Rhythm: Normal rate.  ?Pulmonary:  ?   Effort: Pulmonary effort is normal.   ?Musculoskeletal:  ?   Cervical back: Tenderness present. No swelling, edema, deformity, erythema, signs of trauma, lacerations, rigidity, spasms, torticollis, bony tenderness or crepitus. No pain with movemen

## 2023-01-26 IMAGING — XA IR REPLACE G-TUBE/COLONIC TUBE
3 series · 3 of 3 positions shown · non-contrast
Comparison: none

INDICATION: Gastrostomy tube was recently dislodged and replaced with a Foley
catheter. Patient needs placement of a new gastrostomy tube.

[Series 1: fl (-) angio · 1 of 1 slices shown (1 of 3)]
[im 1/1]
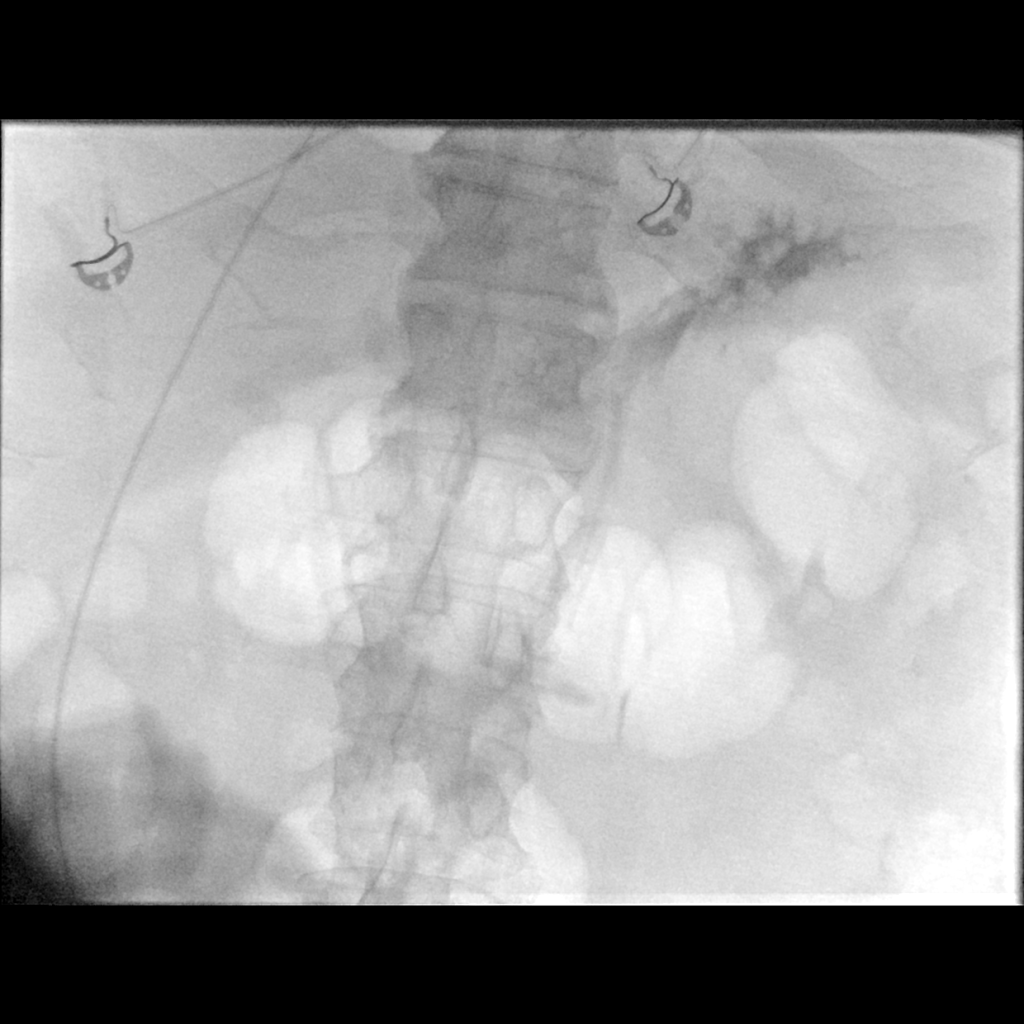

[Series 2: fl (-) angio · 1 of 1 slices shown (2 of 3)]
[im 1/1]
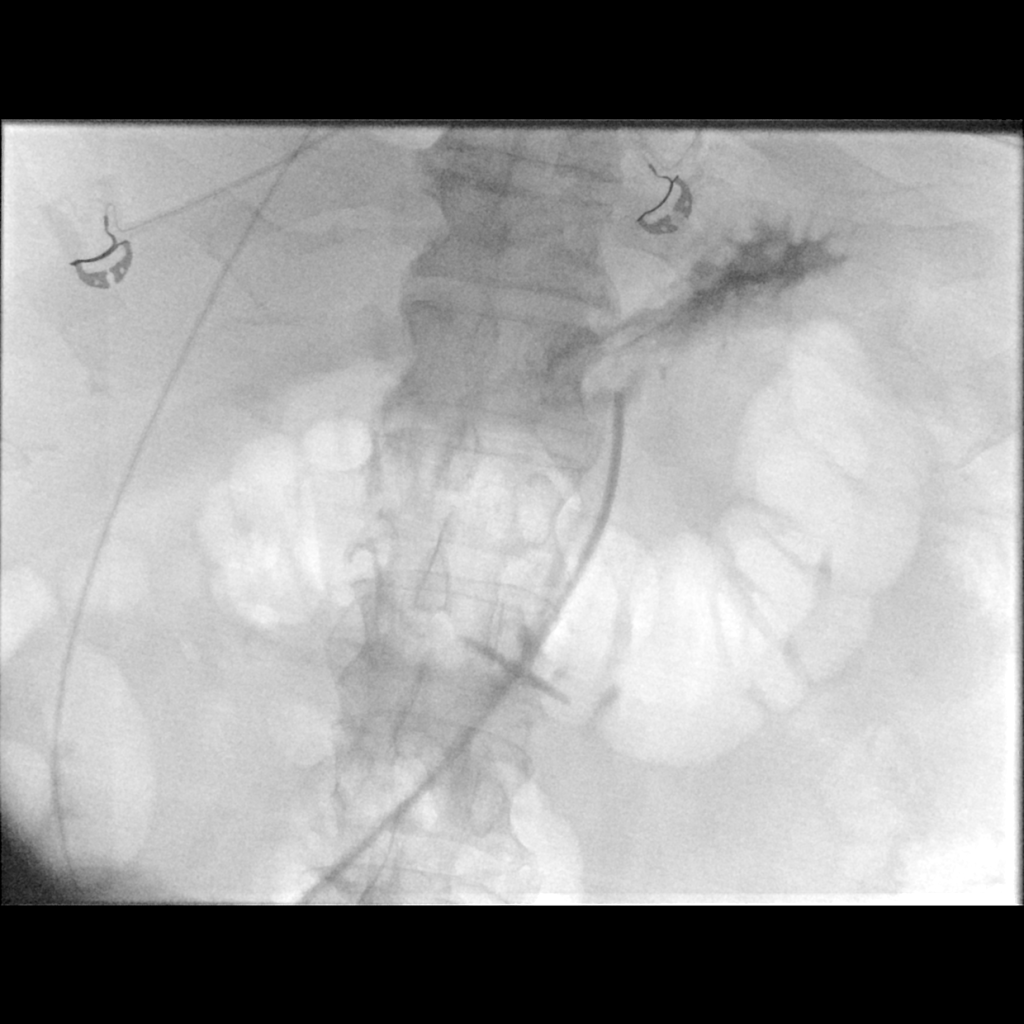

[Series 3: fl (-) angio · 1 of 1 slices shown (3 of 3)]
[im 1/1]
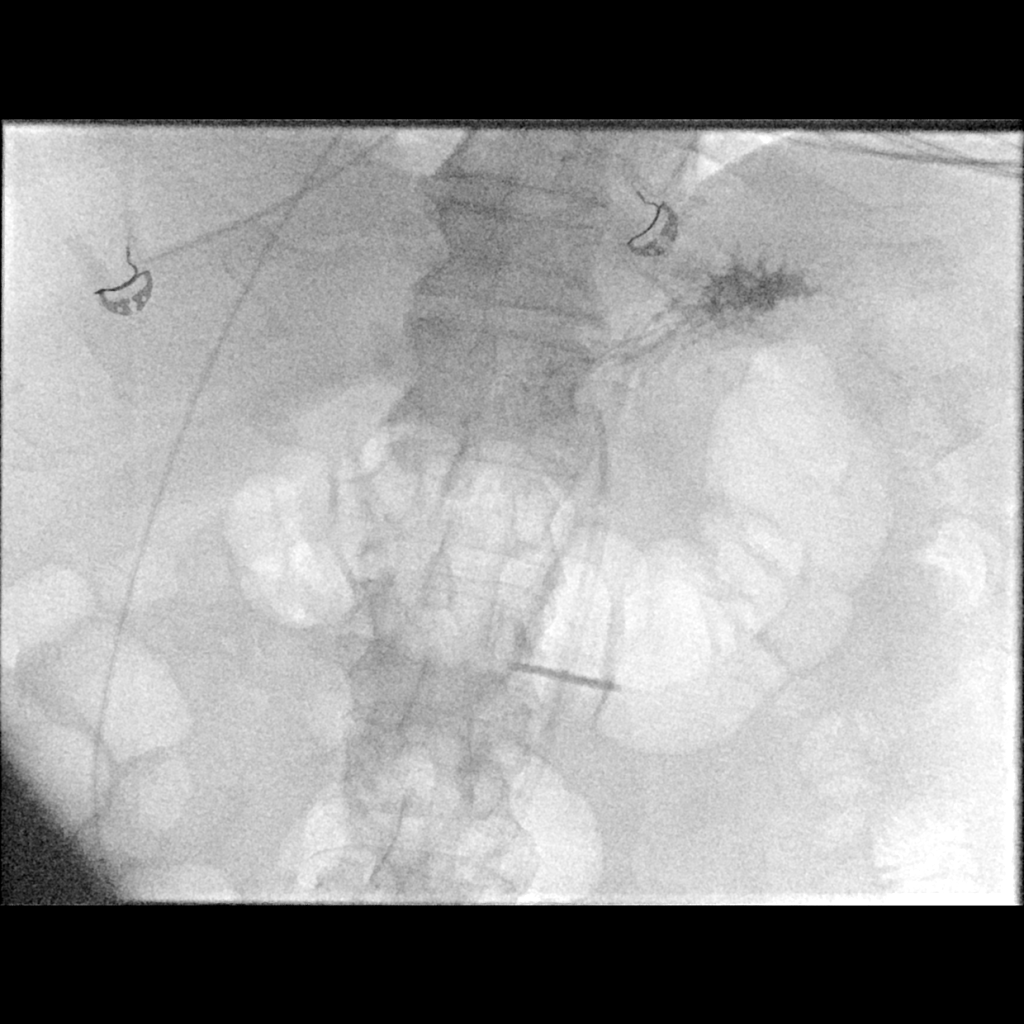

[3 of 3 positions shown; findings below may reference images not displayed]

EXAM:
GASTROSTOMY TUBE REPLACEMENT WITH FLUOROSCOPY

MEDICATIONS:
None

ANESTHESIA/SEDATION:
None

CONTRAST:  10 mL Omnipaque 300-administered into the gastric lumen.

FLUOROSCOPY:
Radiation Exposure Index (as provided by the fluoroscopic device): 4
mGy Kerma

COMPLICATIONS:
None immediate.

PROCEDURE:
Consent was obtained for replacement of the gastrostomy tube.
Maximal Sterile Barrier Technique was utilized including caps, mask,
sterile gowns, sterile gloves, sterile drape, hand hygiene and skin
antiseptic. A timeout was performed prior to the initiation of the
procedure.

Foley catheter was present. Contrast injection confirmed placement
in the stomach. Balloon was deflated and the Foley catheter was
completely removed. A 5 French Kumpe catheter and stiff Amplatz wire
were advanced into the stomach. Tract was dilated to accommodate an
18 French Entuit gastrostomy tube. Balloon was inflated with
approximately 5 mL of saline. Contrast injection confirmed placement
in the stomach. Tube was flushed with saline.

Fluoroscopic images were taken and saved for this procedure.
IMPRESSION: Successful replacement of the gastrostomy tube. Patient currently
has an 18 French balloon retention tube.

## 2023-01-29 DEATH — deceased

## 2023-01-30 IMAGING — DX DG CHEST 1V PORT
1 series · 1 of 1 positions shown · non-contrast
Comparison: 10/09/2021

CLINICAL DATA: Pneumonia

EXAM:
PORTABLE CHEST 1 VIEW

[chest ap]
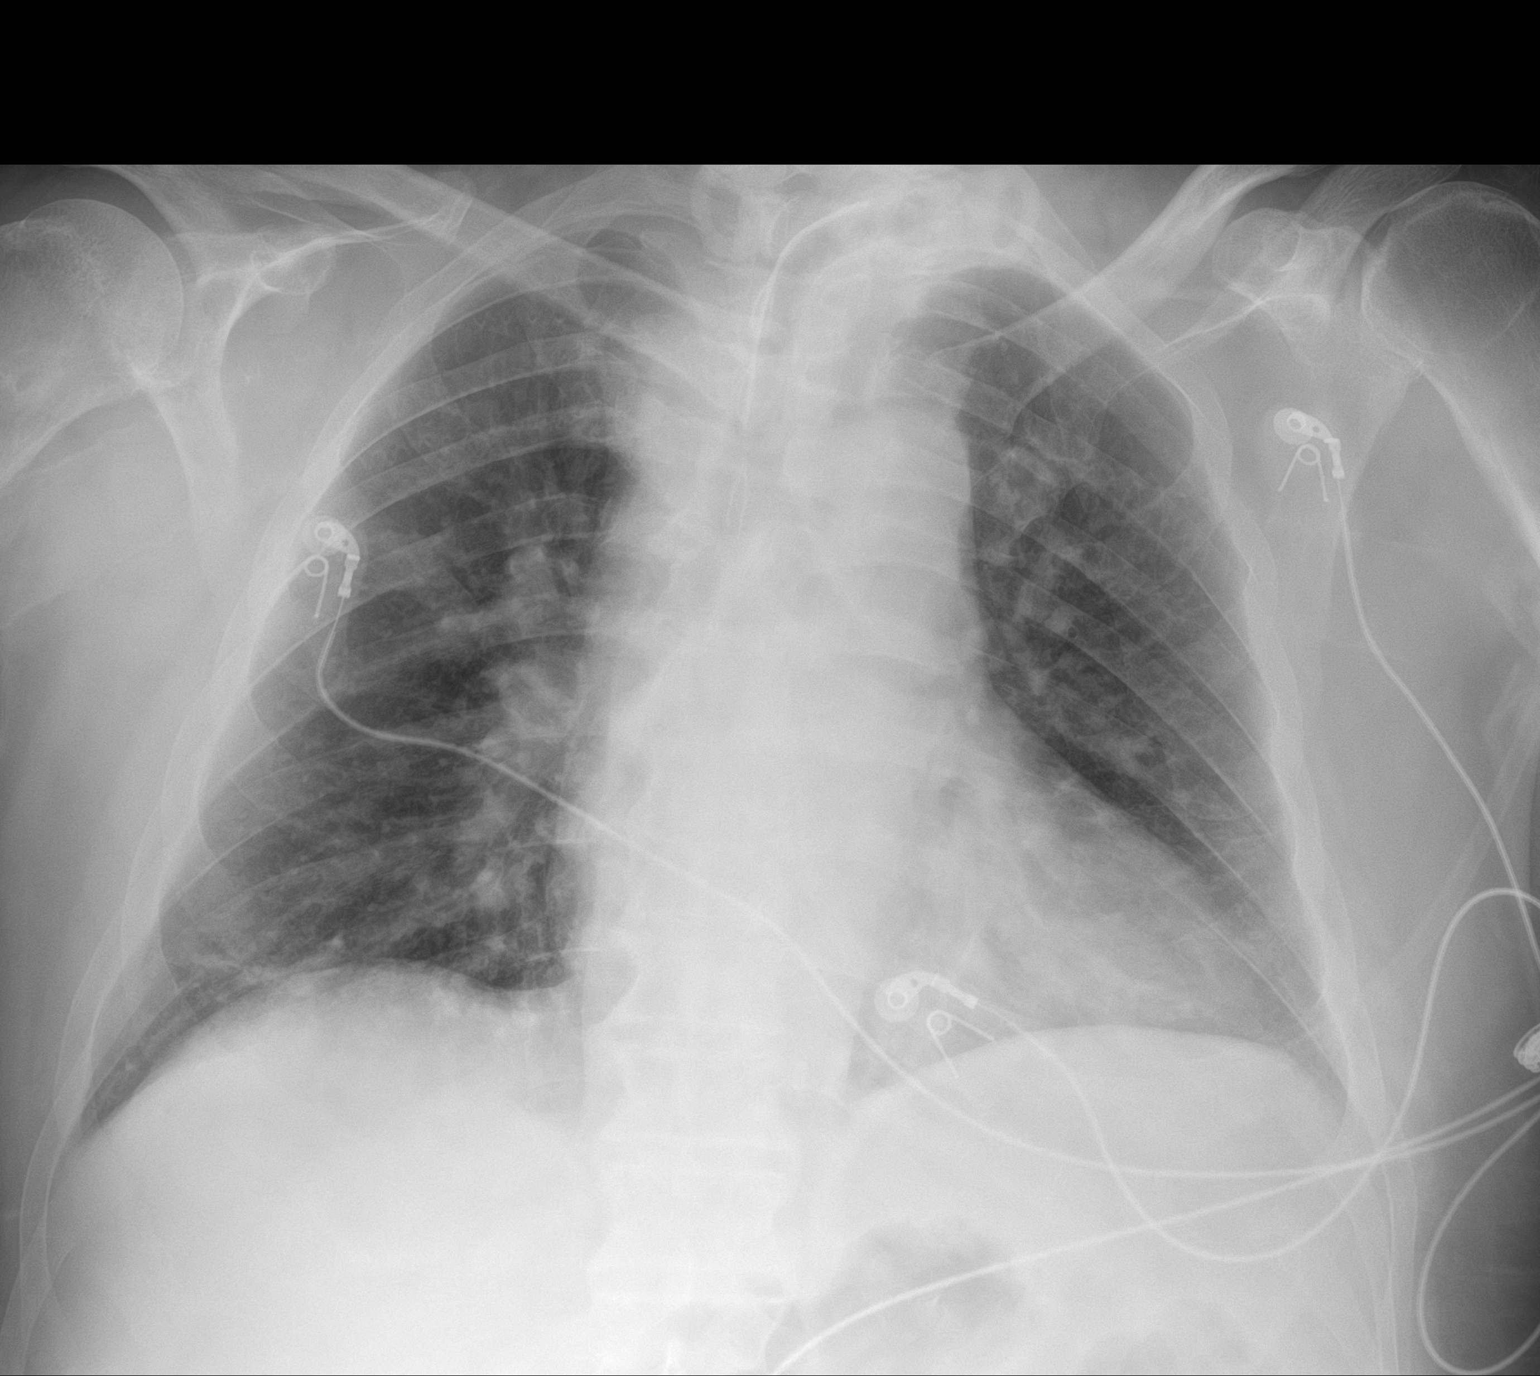

[1 of 1 positions shown; findings below may reference images not displayed]

FINDINGS: Tracheostomy appropriately positioned. Numerous leads and wires
project over the chest. Midline trachea. Borderline cardiomegaly. No
pleural effusion or pneumothorax. Favor subsegmental atelectasis at
both lung bases. No developing lobar consolidation.
IMPRESSION: Borderline cardiomegaly with similar bibasilar atelectasis. No acute
findings.

## 2023-01-30 IMAGING — DX DG ABDOMEN 1V
1 series · 1 of 1 positions shown · non-contrast
Comparison: 09/21/2021

CLINICAL DATA: Peg tube check.

EXAM:
ABDOMEN - 1 VIEW

[abdomen supine]
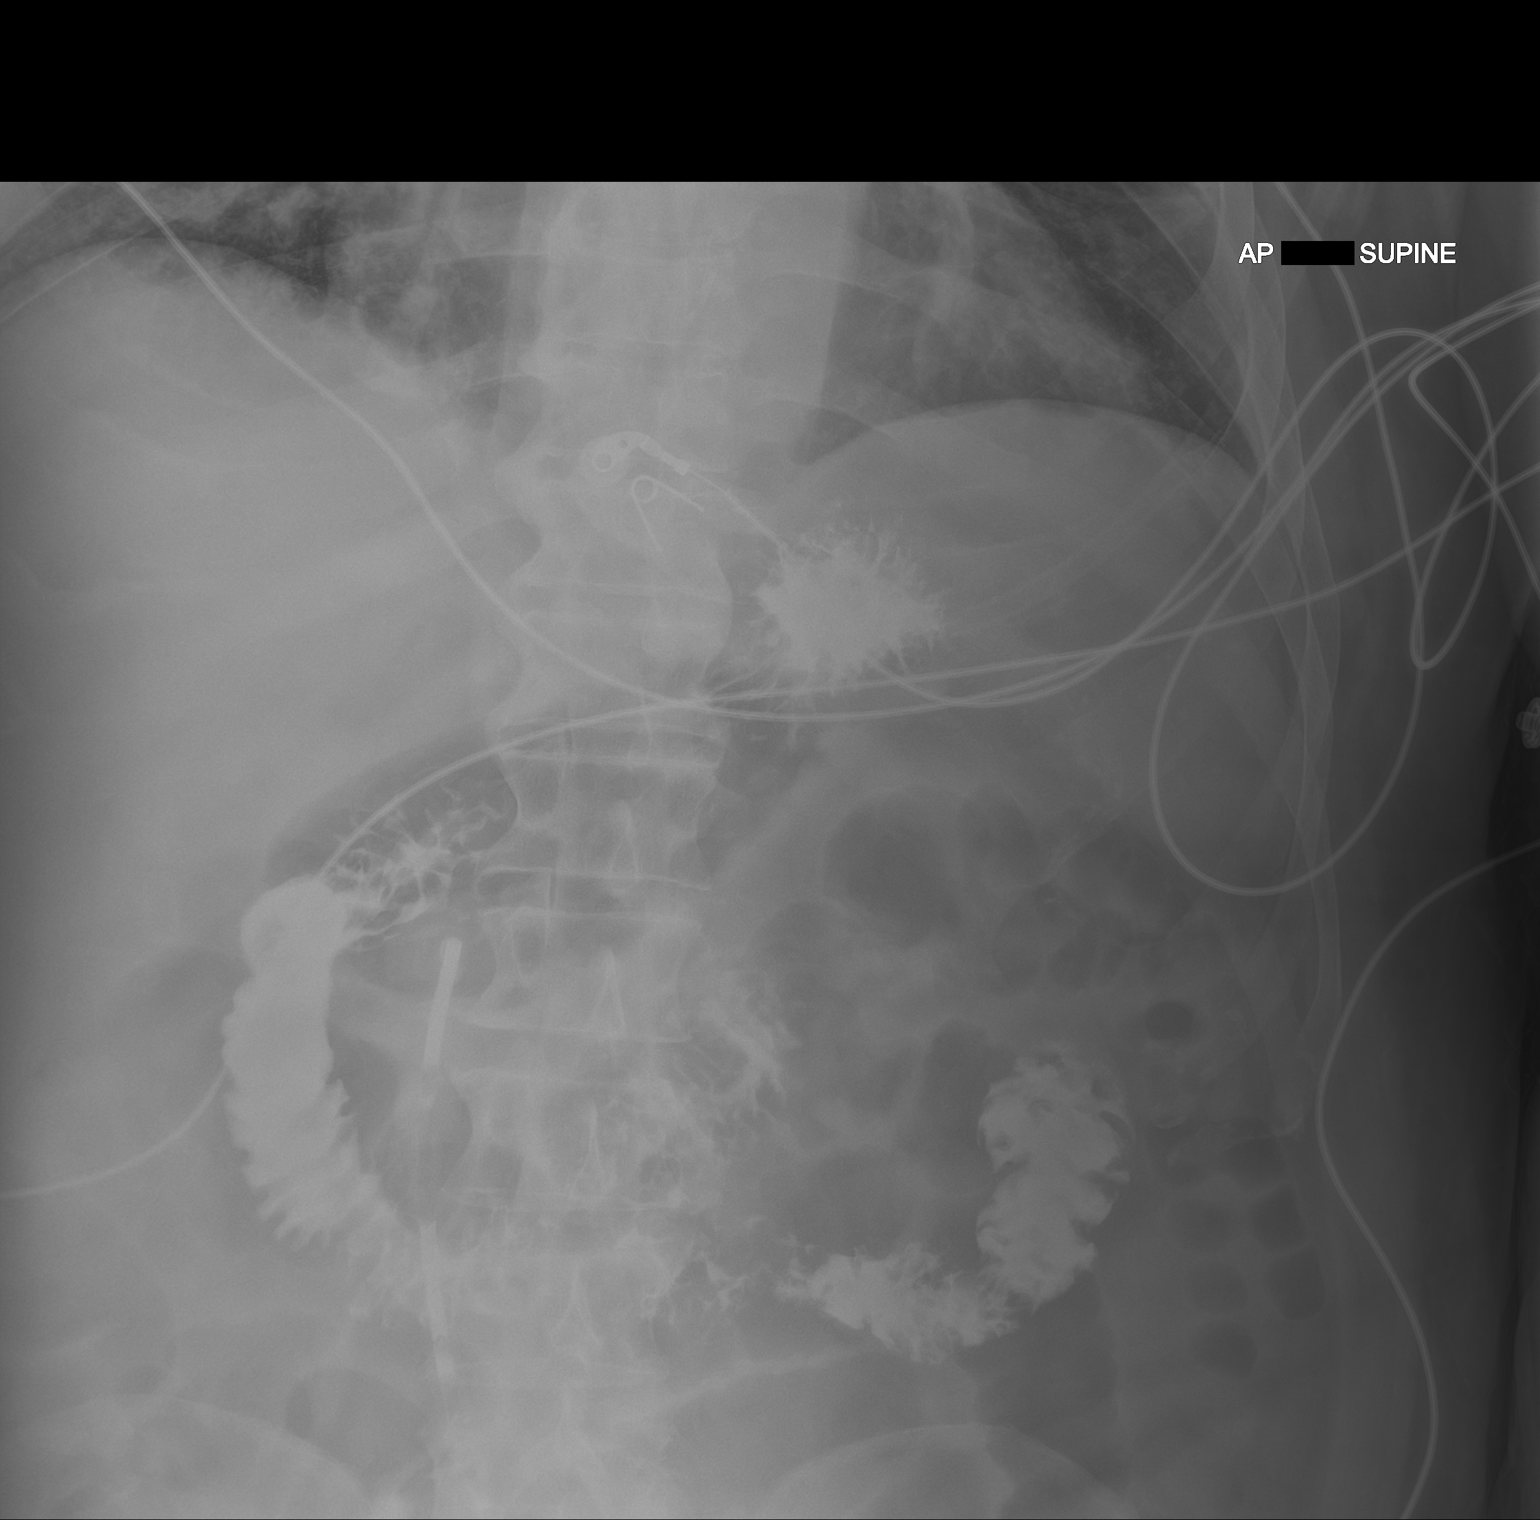

[1 of 1 positions shown; findings below may reference images not displayed]

FINDINGS: Contrast material opacifies the gastric lumen, duodenum and proximal
jejunum. No evidence for free contrast in the peritoneal cavity.
IMPRESSION: Presumably injected contrast through the PEG tube opacifies the
gastric lumen and proximal small bowel. No evidence for contrast
extravasation to suggest leak.
